# Patient Record
Sex: Female | Born: 1970
Health system: Southern US, Community
[De-identification: ages and names within clinical notes are randomized; demographics above are authoritative.]

## PROBLEM LIST (undated history)

## (undated) DIAGNOSIS — B019 Varicella without complication: Secondary | ICD-10-CM

## (undated) DIAGNOSIS — T7840XA Allergy, unspecified, initial encounter: Secondary | ICD-10-CM

## (undated) HISTORY — PX: WISDOM TOOTH EXTRACTION: SHX21

## (undated) HISTORY — DX: Allergy, unspecified, initial encounter: T78.40XA

## (undated) HISTORY — DX: Varicella without complication: B01.9

---

## 1990-09-12 HISTORY — PX: DILATION AND CURETTAGE, DIAGNOSTIC / THERAPEUTIC: SUR384

## 2009-05-02 ENCOUNTER — Ambulatory Visit: Payer: Self-pay | Admitting: Diagnostic Radiology

## 2009-05-02 ENCOUNTER — Emergency Department (HOSPITAL_BASED_OUTPATIENT_CLINIC_OR_DEPARTMENT_OTHER): Admission: EM | Admit: 2009-05-02 | Discharge: 2009-05-03 | Payer: Self-pay | Admitting: Emergency Medicine

## 2009-05-02 IMAGING — CT CT ABDOMEN W/O CM
2 of 4 series · 16 of 46 positions shown, 18 images · non-contrast
Comparison: None

CT ABDOMEN

CLINICAL DATA: Right-sided flank pain.

CT OF THE ABDOMEN AND PELVIS WITHOUT CONTRAST (CT UROGRAM)
TECHNIQUE: Multidetector CT imaging was performed through the
abdomen and pelvis to include the urinary tract.

[Series 2: renal stone < 200 lbs 5.0 b31f · axial · 0.84mm/px · z∈[-451,-6]mm · 13 of 99 slices shown, 15 images]
[im 5/99  soft-tissue]
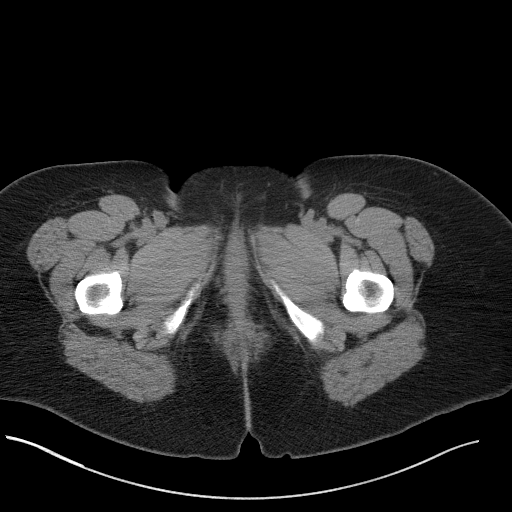
[im 5/99  bone]
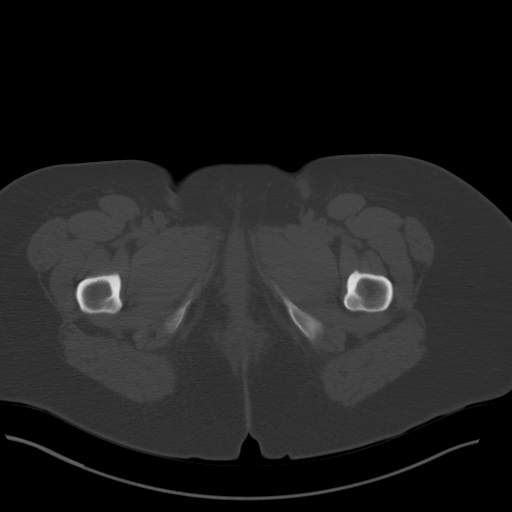
[im 13/99  soft-tissue]
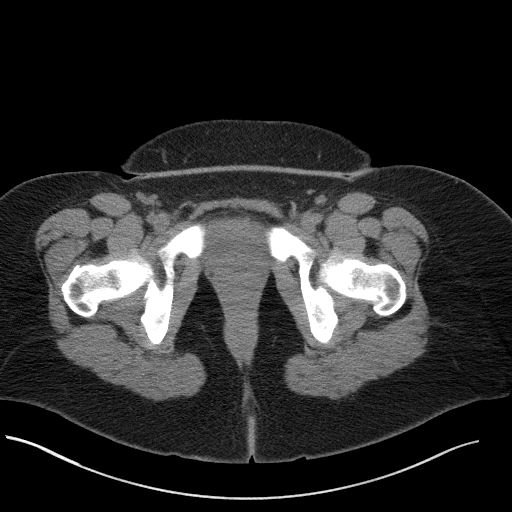
[im 21/99  soft-tissue]
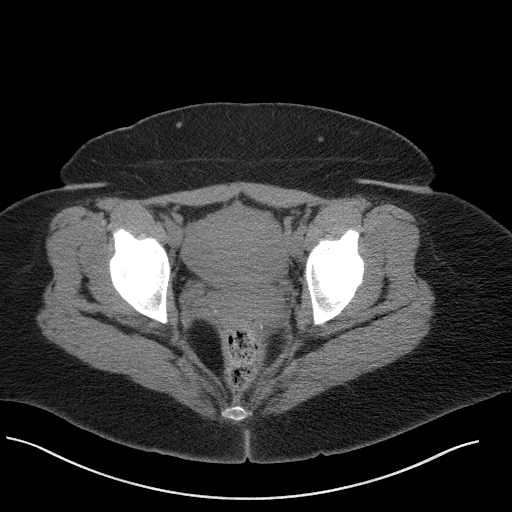
[im 29/99  soft-tissue]
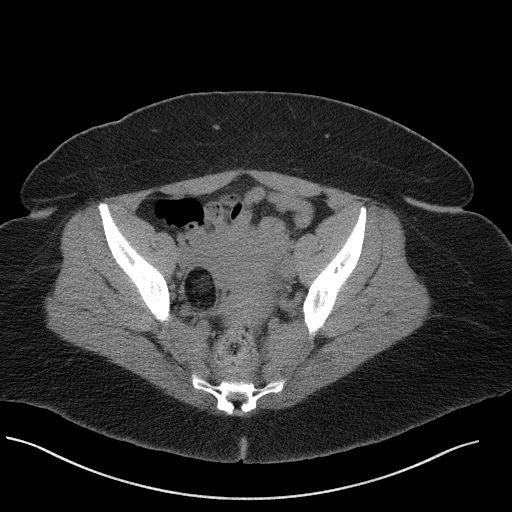
[im 33/99  soft-tissue]
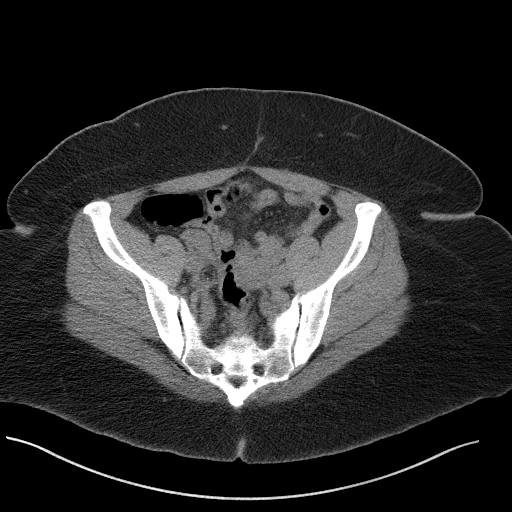
[im 41/99  soft-tissue]
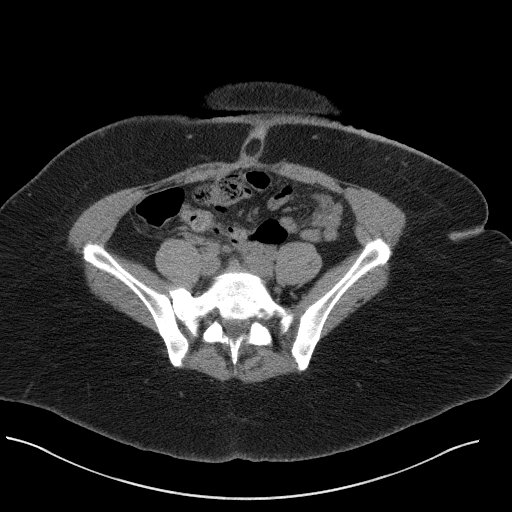
[im 50/99  soft-tissue]
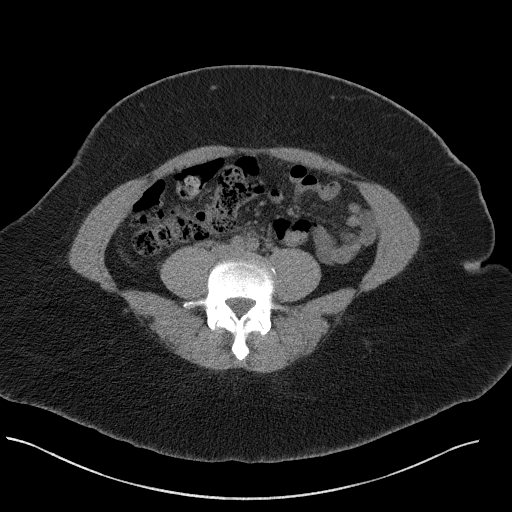
[im 58/99  soft-tissue]
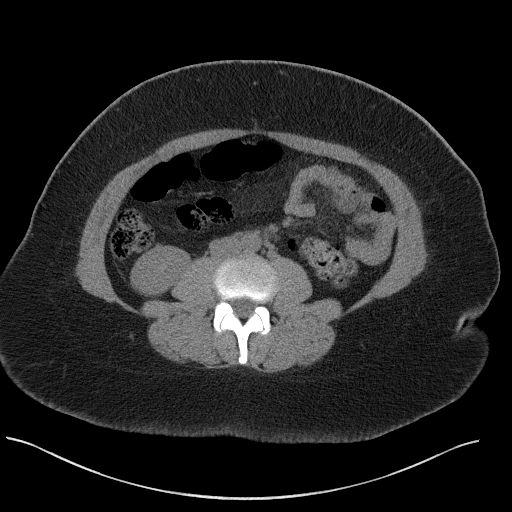
[im 66/99  soft-tissue]
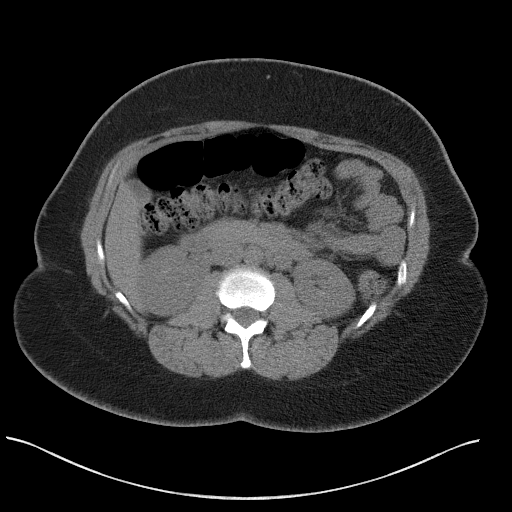
[im 66/99  bone]
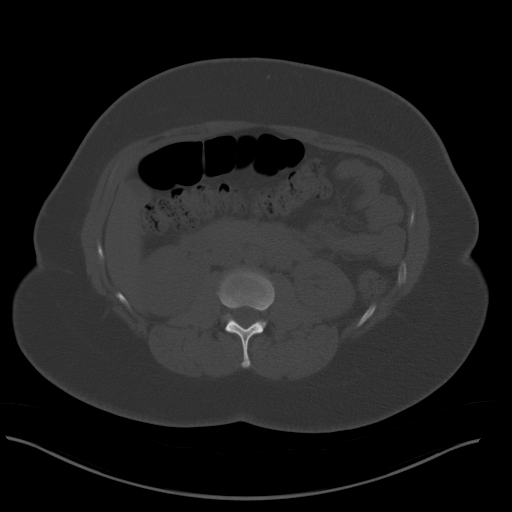
[im 70/99  soft-tissue]
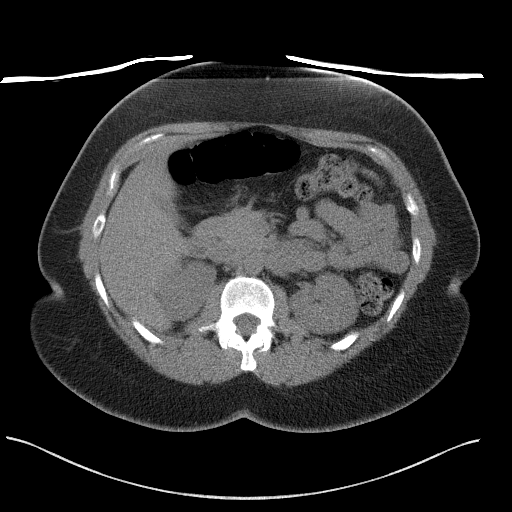
[im 78/99  soft-tissue]
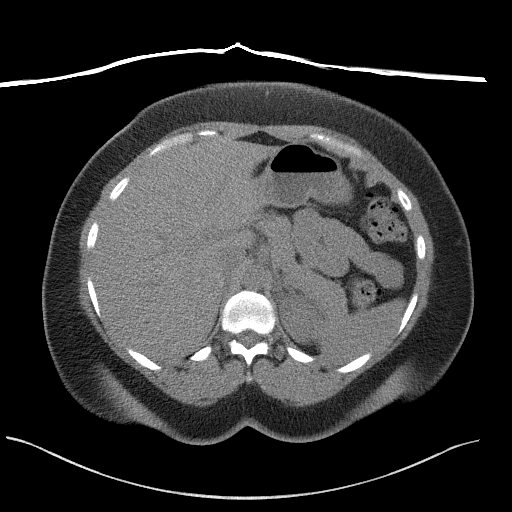
[im 86/99  soft-tissue]
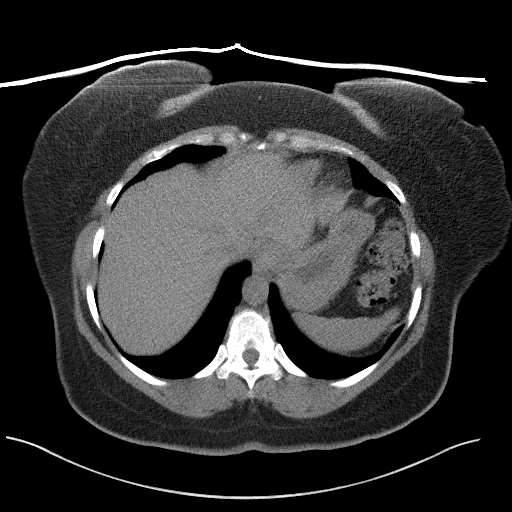
[im 94/99  soft-tissue]
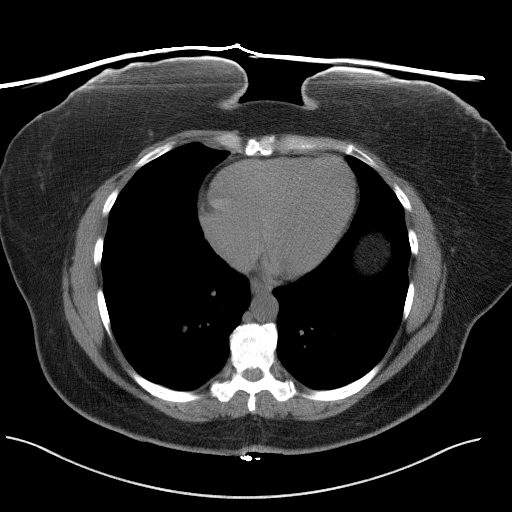

[Series 5: renal stone 3.0 coronal · coronal · 0.89mm/px · 3 of 99 slices shown]
[im 33/99  soft-tissue]
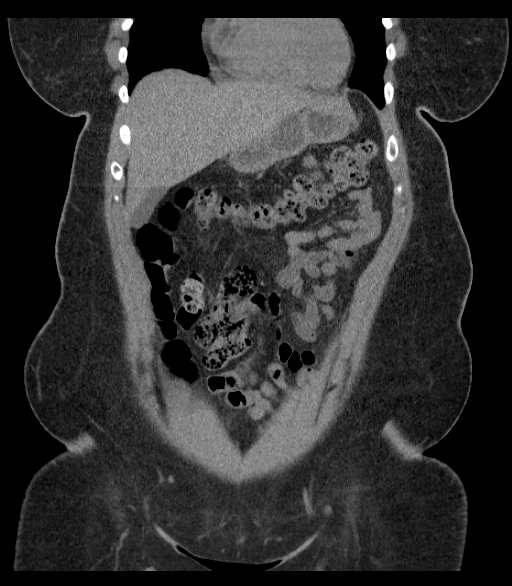
[im 44/99  soft-tissue]
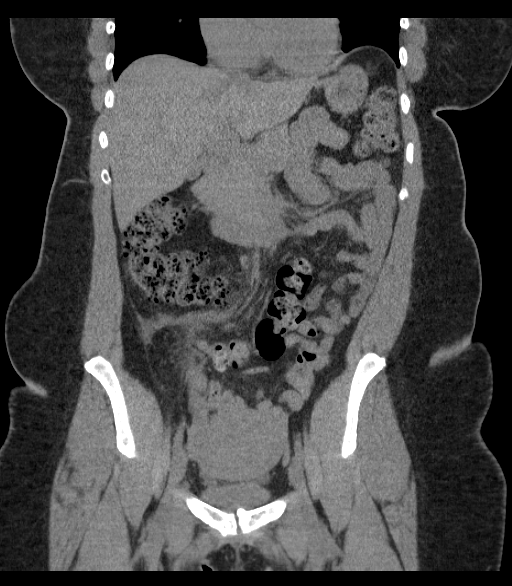
[im 55/99  soft-tissue]
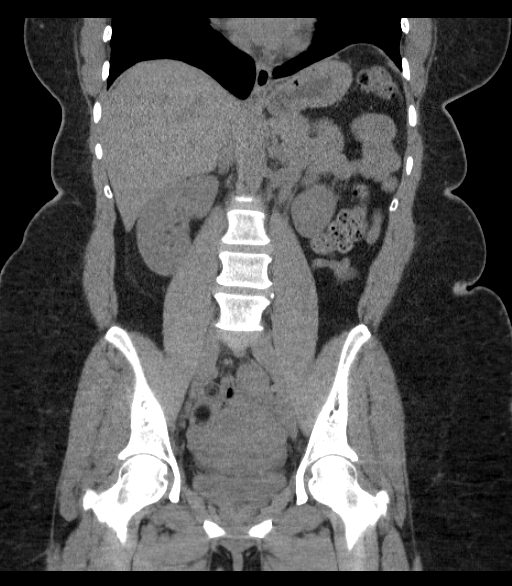

[16 of 46 positions shown; findings below may reference images not displayed]

FINDINGS: Lung bases are clear.  No effusions.  Heart is normal
size.

No renal or proximal ureteral stones.  No hydronephrosis.  Solid
organs have an unremarkable unenhanced appearance.  Gallbladder is
grossly unremarkable. Bowel grossly unremarkable.  No free fluid,
free air, or adenopathy. Aorta is normal caliber.

No acute bony abnormality.
IMPRESSION: No acute findings in the abdomen.

CT PELVIS
FINDINGS: Within the right lower quadrant, adjacent to the
ascending colon, there is inflammatory stranding.  This is separate
from the appendix.  The appendix is visualized in the midline and
is normal.  No definite diverticula are seen although
diverticulitis could give this appearance.  The stranding also is
just anterior to the ovarian vein.  The ovarian vein appears mildly
prominent in its mid section.  This conceivably could also may be
due to ovarian vein thrombosis.

There is a small umbilical hernia containing fat.  Within the right
ovary, there is a 3.8 cm fatty mass compatible with ovarian
dermoid.  Uterus and left ovary unremarkable.  No free fluid or
free air.
IMPRESSION: Inflammatory process in the right lower quadrant separate from the
appendix.  This is best seen posterior to the ascending colon,
anterior to the right psoas muscle and ovarian vein.  This could
reflect an inflammatory process within the colon such as
diverticulitis although no well-defined diverticula are seen.
Alternatively, this could reflect changes of ovarian vein
thrombosis.

Right ovarian dermoid.

## 2010-12-18 LAB — URINALYSIS, ROUTINE W REFLEX MICROSCOPIC
Glucose, UA: NEGATIVE mg/dL
Hgb urine dipstick: NEGATIVE
Ketones, ur: 40 mg/dL — AB
Protein, ur: NEGATIVE mg/dL
Urobilinogen, UA: 1 mg/dL (ref 0.0–1.0)

## 2010-12-18 LAB — COMPREHENSIVE METABOLIC PANEL
ALT: 3 U/L (ref 0–35)
Alkaline Phosphatase: 89 U/L (ref 39–117)
BUN: 10 mg/dL (ref 6–23)
CO2: 24 mEq/L (ref 19–32)
Chloride: 106 mEq/L (ref 96–112)
Glucose, Bld: 84 mg/dL (ref 70–99)
Potassium: 3.7 mEq/L (ref 3.5–5.1)
Sodium: 142 mEq/L (ref 135–145)
Total Bilirubin: 0.6 mg/dL (ref 0.3–1.2)
Total Protein: 8.1 g/dL (ref 6.0–8.3)

## 2010-12-18 LAB — DIFFERENTIAL
Basophils Absolute: 0 10*3/uL (ref 0.0–0.1)
Basophils Relative: 1 % (ref 0–1)
Eosinophils Absolute: 0.1 10*3/uL (ref 0.0–0.7)
Monocytes Relative: 7 % (ref 3–12)
Neutrophils Relative %: 56 % (ref 43–77)

## 2010-12-18 LAB — PREGNANCY, URINE: Preg Test, Ur: NEGATIVE

## 2010-12-18 LAB — URINE MICROSCOPIC-ADD ON

## 2010-12-18 LAB — CBC
HCT: 35.4 % — ABNORMAL LOW (ref 36.0–46.0)
Hemoglobin: 11.4 g/dL — ABNORMAL LOW (ref 12.0–15.0)
RBC: 4.36 MIL/uL (ref 3.87–5.11)
RDW: 15 % (ref 11.5–15.5)
WBC: 5.9 10*3/uL (ref 4.0–10.5)

## 2011-07-11 ENCOUNTER — Inpatient Hospital Stay (INDEPENDENT_AMBULATORY_CARE_PROVIDER_SITE_OTHER)
Admission: RE | Admit: 2011-07-11 | Discharge: 2011-07-11 | Disposition: A | Discharge status: Home / Self Care | Payer: PRIVATE HEALTH INSURANCE | Source: Ambulatory Visit | Attending: Family Medicine | Admitting: Family Medicine

## 2011-07-11 ENCOUNTER — Emergency Department (HOSPITAL_COMMUNITY): Payer: PRIVATE HEALTH INSURANCE

## 2011-07-11 ENCOUNTER — Emergency Department (HOSPITAL_COMMUNITY)
Admission: EM | Admit: 2011-07-11 | Discharge: 2011-07-11 | Disposition: A | Discharge status: Home / Self Care | Payer: PRIVATE HEALTH INSURANCE | Attending: Emergency Medicine | Admitting: Emergency Medicine

## 2011-07-11 DIAGNOSIS — D279 Benign neoplasm of unspecified ovary: Secondary | ICD-10-CM | POA: Insufficient documentation

## 2011-07-11 DIAGNOSIS — R10813 Right lower quadrant abdominal tenderness: Secondary | ICD-10-CM | POA: Insufficient documentation

## 2011-07-11 DIAGNOSIS — D259 Leiomyoma of uterus, unspecified: Secondary | ICD-10-CM | POA: Insufficient documentation

## 2011-07-11 DIAGNOSIS — R109 Unspecified abdominal pain: Secondary | ICD-10-CM | POA: Insufficient documentation

## 2011-07-11 DIAGNOSIS — A499 Bacterial infection, unspecified: Secondary | ICD-10-CM | POA: Insufficient documentation

## 2011-07-11 DIAGNOSIS — R110 Nausea: Secondary | ICD-10-CM | POA: Insufficient documentation

## 2011-07-11 DIAGNOSIS — B9689 Other specified bacterial agents as the cause of diseases classified elsewhere: Secondary | ICD-10-CM | POA: Insufficient documentation

## 2011-07-11 DIAGNOSIS — N760 Acute vaginitis: Secondary | ICD-10-CM | POA: Insufficient documentation

## 2011-07-11 LAB — WET PREP, GENITAL: Yeast Wet Prep HPF POC: NONE SEEN

## 2011-07-11 LAB — URINALYSIS, ROUTINE W REFLEX MICROSCOPIC
Bilirubin Urine: NEGATIVE
Glucose, UA: NEGATIVE mg/dL
Ketones, ur: 15 mg/dL — AB
pH: 6 (ref 5.0–8.0)

## 2011-07-11 LAB — POCT URINALYSIS DIP (DEVICE)
Glucose, UA: NEGATIVE mg/dL
Hgb urine dipstick: NEGATIVE
Leukocytes, UA: NEGATIVE
Nitrite: NEGATIVE
Urobilinogen, UA: 1 mg/dL (ref 0.0–1.0)
pH: 6.5 (ref 5.0–8.0)

## 2011-07-11 LAB — COMPREHENSIVE METABOLIC PANEL
Albumin: 3.5 g/dL (ref 3.5–5.2)
BUN: 10 mg/dL (ref 6–23)
Chloride: 105 mEq/L (ref 96–112)
Creatinine, Ser: 0.8 mg/dL (ref 0.50–1.10)
Total Bilirubin: 0.7 mg/dL (ref 0.3–1.2)
Total Protein: 7.8 g/dL (ref 6.0–8.3)

## 2011-07-11 LAB — CBC
HCT: 38.4 % (ref 36.0–46.0)
MCH: 27.7 pg (ref 26.0–34.0)
MCHC: 32.6 g/dL (ref 30.0–36.0)
MCV: 85.1 fL (ref 78.0–100.0)
RDW: 14 % (ref 11.5–15.5)

## 2011-07-11 LAB — LIPASE, BLOOD: Lipase: 27 U/L (ref 11–59)

## 2011-07-11 LAB — DIFFERENTIAL
Eosinophils Relative: 2 % (ref 0–5)
Lymphocytes Relative: 46 % (ref 12–46)
Lymphs Abs: 1.7 10*3/uL (ref 0.7–4.0)
Monocytes Absolute: 0.3 10*3/uL (ref 0.1–1.0)
Monocytes Relative: 8 % (ref 3–12)

## 2011-07-11 LAB — POCT PREGNANCY, URINE: Preg Test, Ur: NEGATIVE

## 2011-07-11 IMAGING — US US TRANSVAGINAL NON-OB
1 series · 14 of 25 positions shown · non-contrast
Comparison: CT [DATE]

CLINICAL DATA: Right lower quadrant abdominal pain for 3-4 days.
Gravida 4, para 3.  LMP [DATE].

TRANSABDOMINAL AND TRANSVAGINAL ULTRASOUND OF PELVIS
TECHNIQUE: Both transabdominal and transvaginal ultrasound
examinations of the pelvis were performed. Transabdominal technique
was performed for global imaging of the pelvis including uterus,
ovaries, adnexal regions, and pelvic cul-de-sac.

[Series 1: us transvaginal non-ob · 0.28mm/px · 41 acquisitions, 14 frames shown]
[im 1/41]
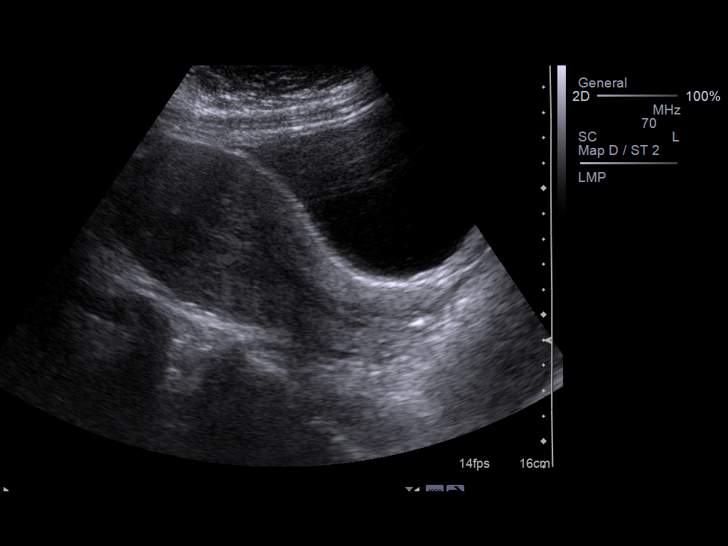
[im 4/41]
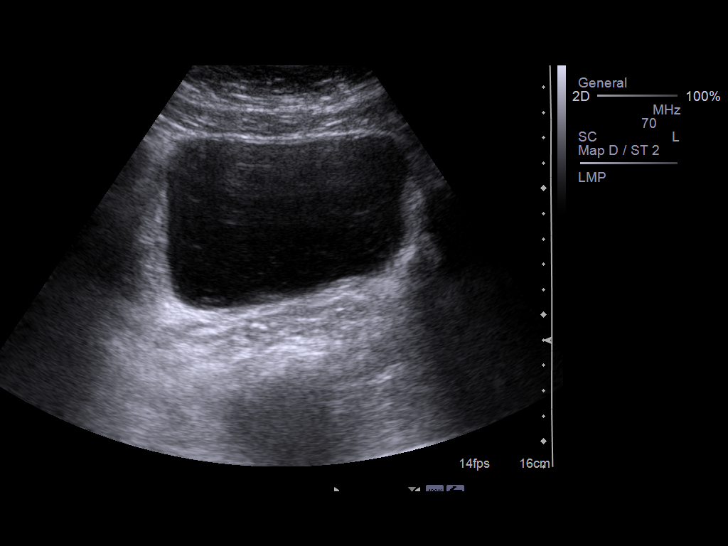
[im 7/41]
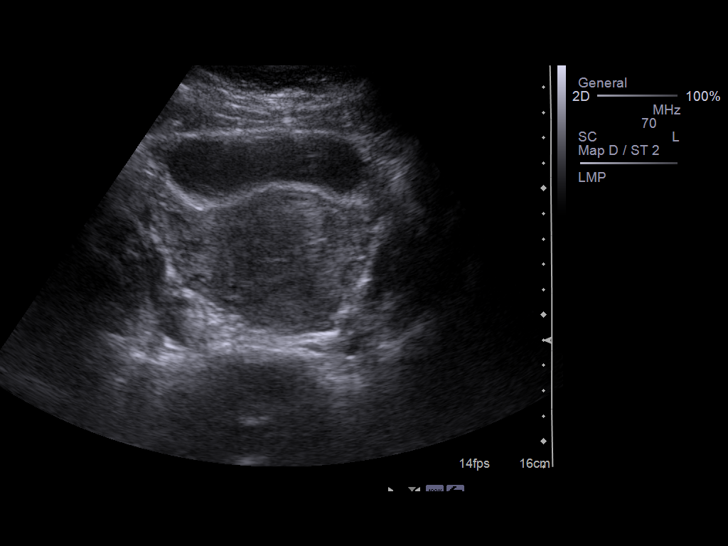
[im 11/41]
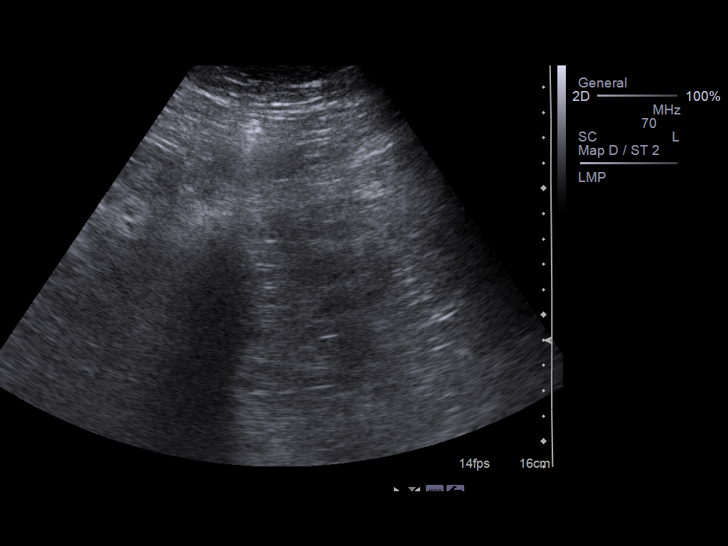
[im 14/41]
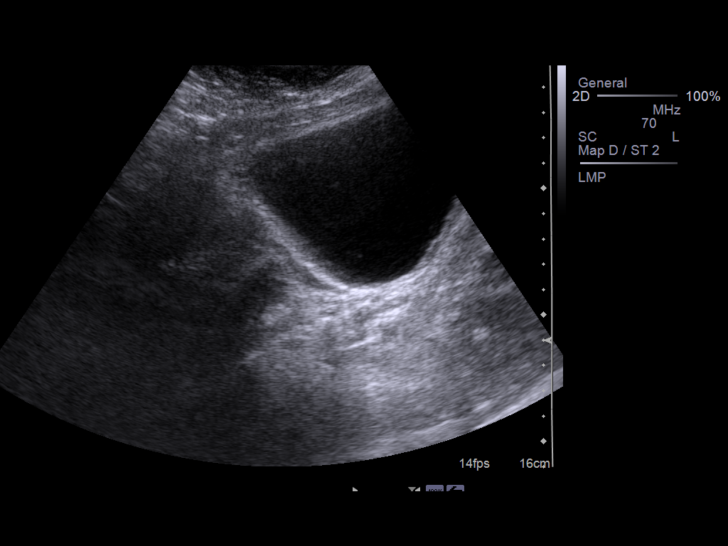
[im 16/41]
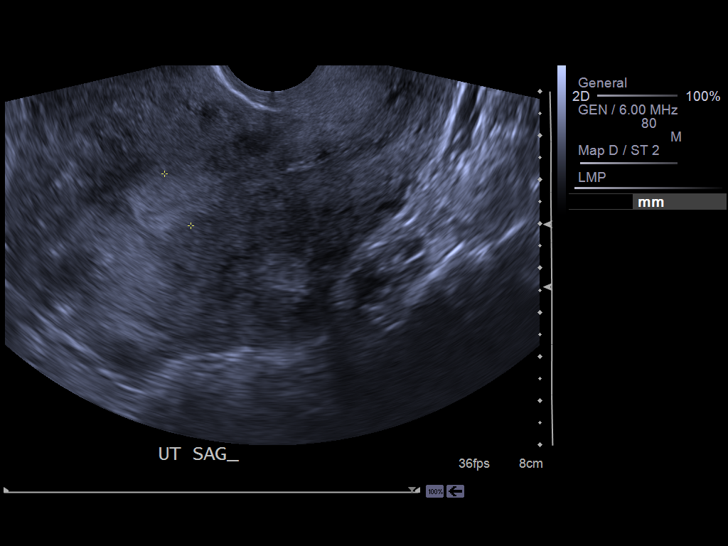
[im 19/41]
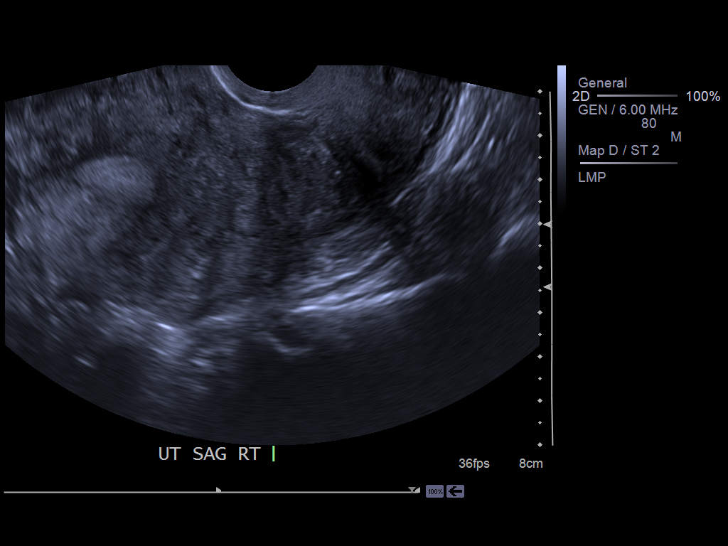
[im 22/41]
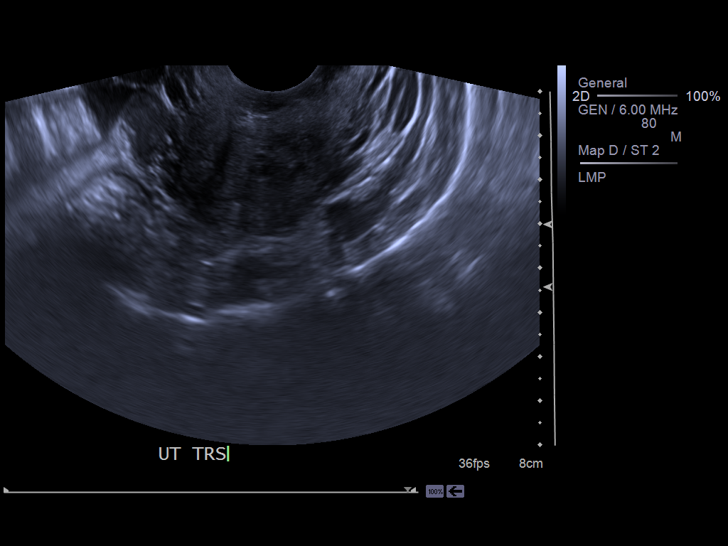
[im 26/41]
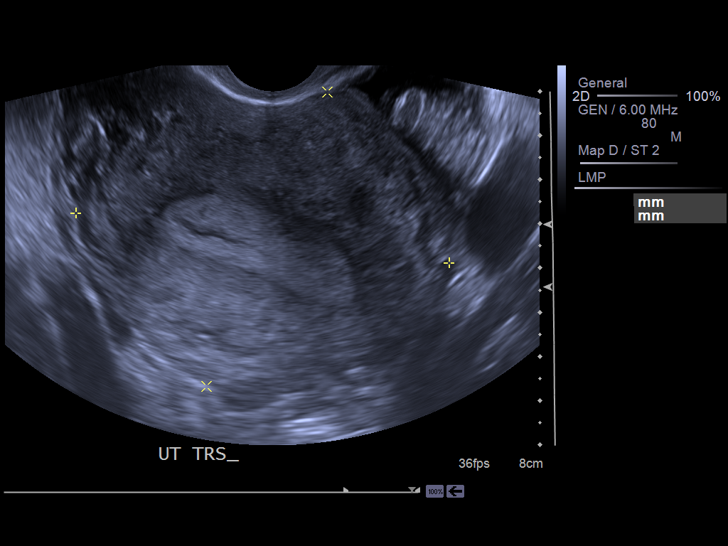
[im 27/41]
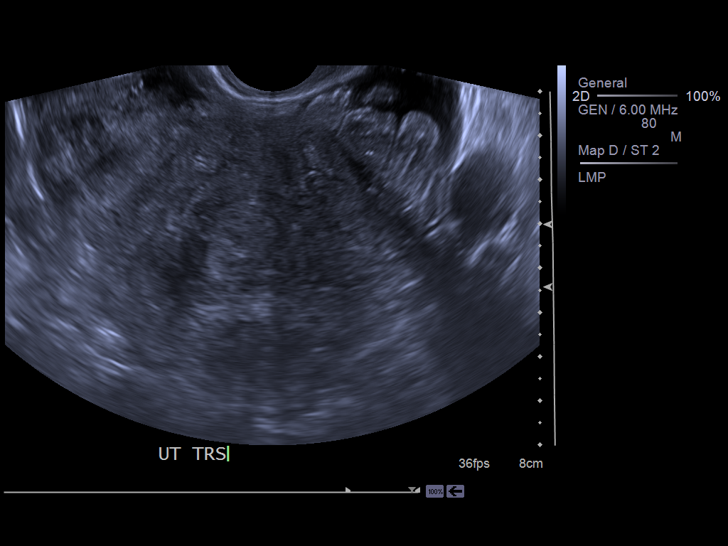
[im 31/41]
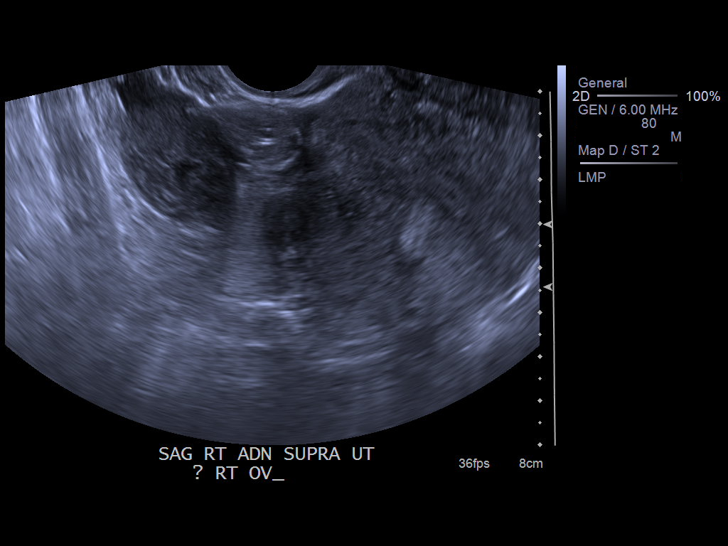
[im 34/41]
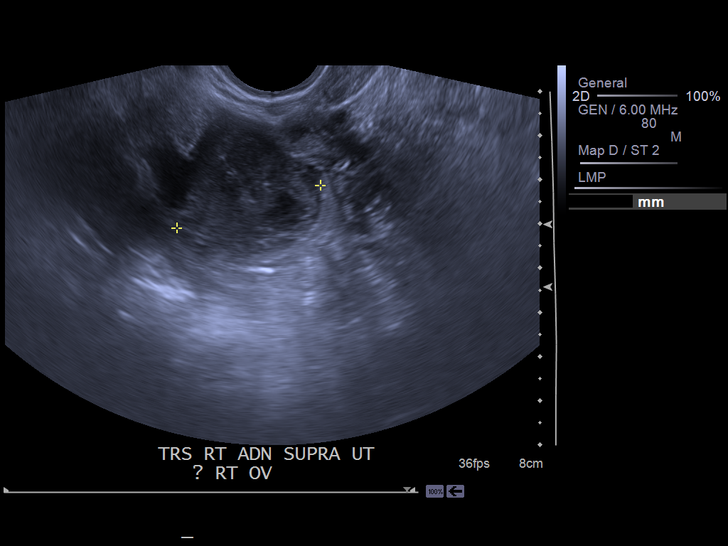
[im 37/41]
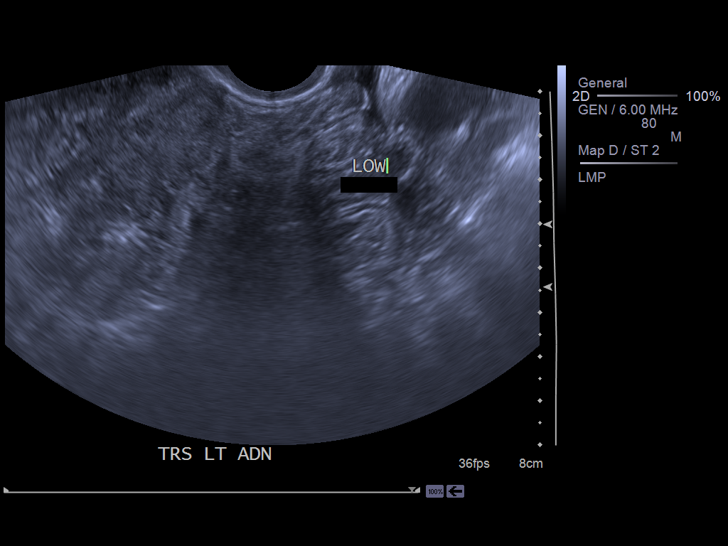
[im 41/41]
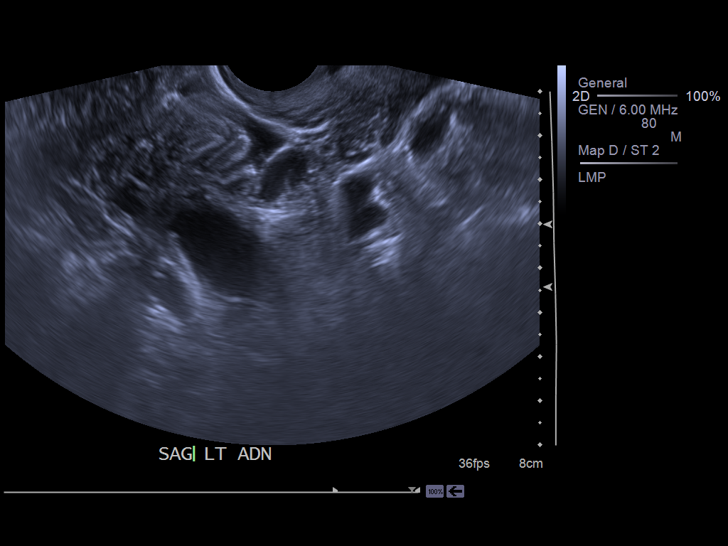

[14 of 25 positions shown; findings below may reference images not displayed]

It was necessary to proceed with endovaginal exam following the
transabdominal exam to visualize the uterus and ovaries.
FINDINGS: Uterus: The uterus is 13.8 x 7.1 x 8.4 cm.  Multiple hypoechoic
nodules are identified, the largest of which measures 2.2 x 1.7 x
2.0 cm.  The findings are consistent with fibroids.

Endometrium: The endometrium is 1.3 cm in thickness and is
homogeneous.

Right ovary:  The right ovary is 3.6 x 2.4 x 3.4 cm.  The
appearance is normal.

Left ovary: The left ovary is not well seen.  Ovary is likely
obscured by bowel gas.

Other findings: There is a small amount free pelvic fluid.
IMPRESSION: 1.  Enlarged uterus, containing small fibroids.
2.  Normal-appearing right ovary.
3.  Non-visualized left ovary.

## 2011-07-11 IMAGING — CT CT ABD-PELV W/ CM
2 of 5 series · 17 of 46 positions shown, 19 images · IV contrast (omnipaque)
Comparison: [DATE]

CLINICAL DATA: Abdominal and right flank pain radiating to right
lower quadrant

CT ABDOMEN AND PELVIS WITH CONTRAST
TECHNIQUE: Multidetector CT imaging of the abdomen and pelvis was
performed following the standard protocol during bolus
administration of intravenous contrast. Sagittal and coronal MPR
images reconstructed from axial data set.
Contrast: 100mL OMNIPAQUE IOHEXOL 300 MG/ML IV SOLN; Dilute oral
contrast.

[Series 2: routine · axial · 0.72mm/px · z∈[+141,+596]mm · 14 of 107 slices shown, 16 images]
[im 8/107  soft-tissue]
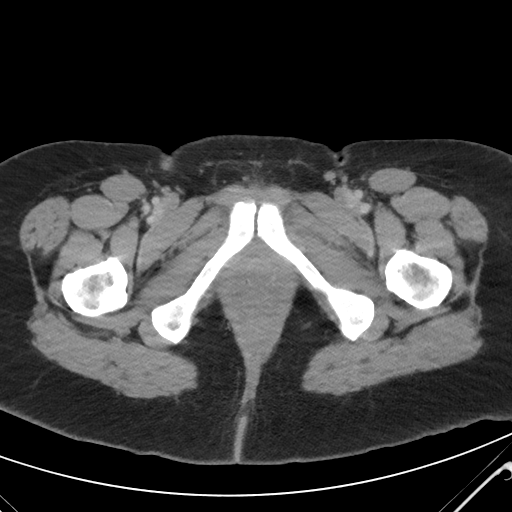
[im 8/107  bone]
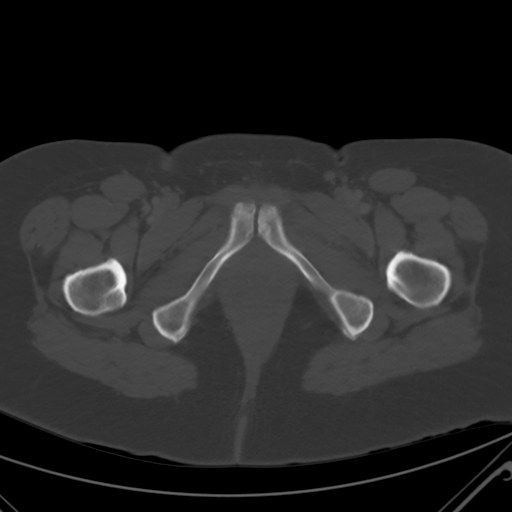
[im 15/107  soft-tissue]
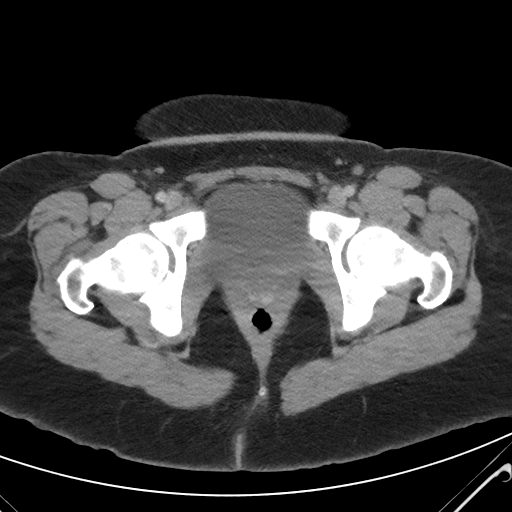
[im 22/107  soft-tissue]
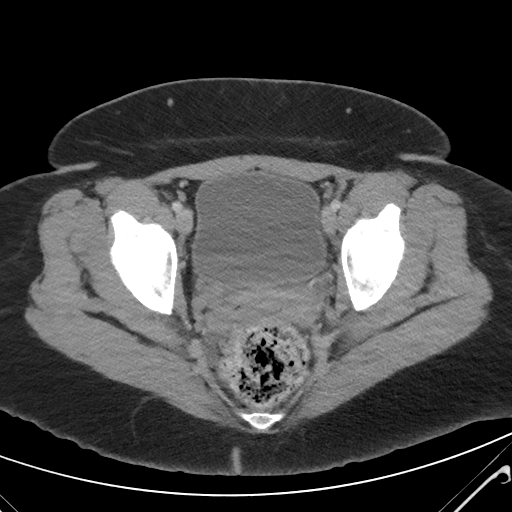
[im 29/107  soft-tissue]
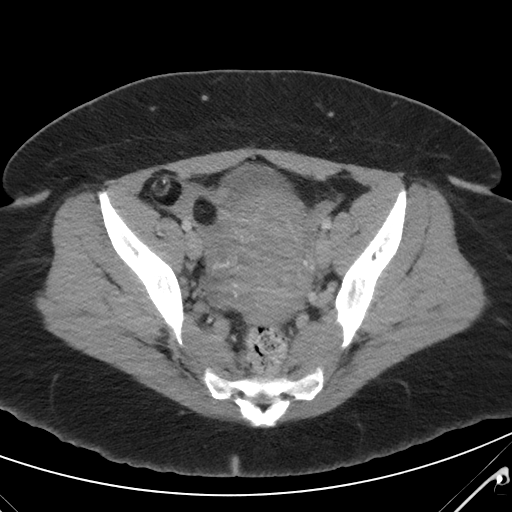
[im 36/107  soft-tissue]
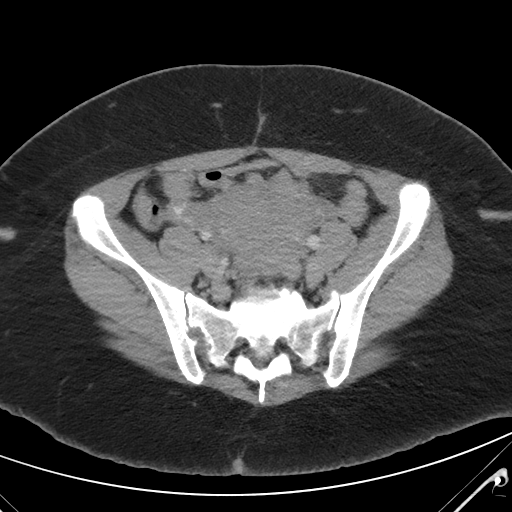
[im 43/107  soft-tissue]
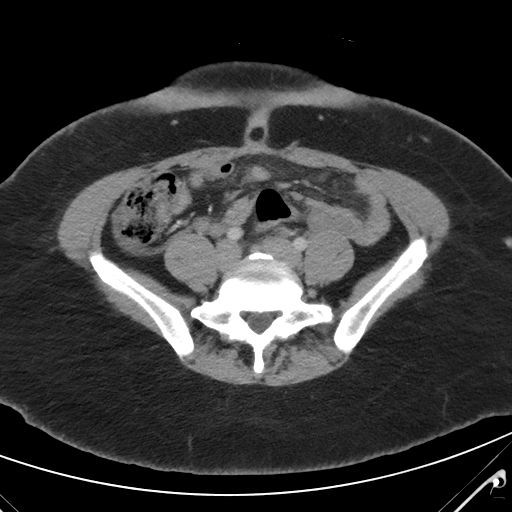
[im 50/107  soft-tissue]
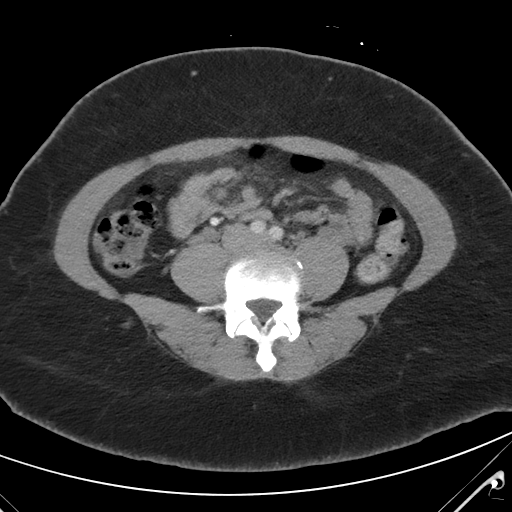
[im 57/107  soft-tissue]
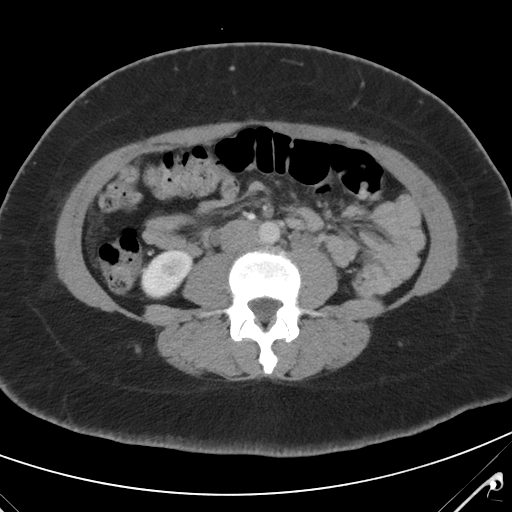
[im 64/107  soft-tissue]
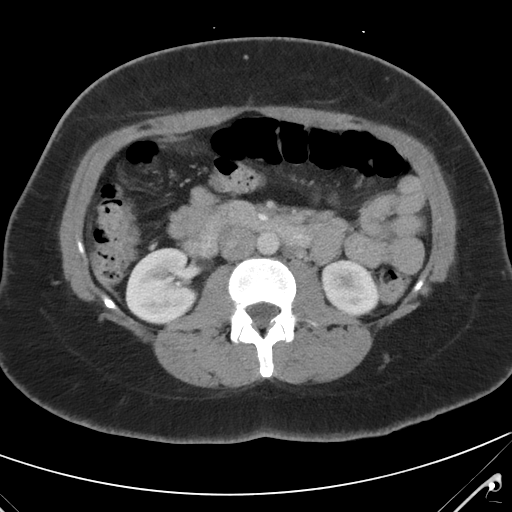
[im 64/107  bone]
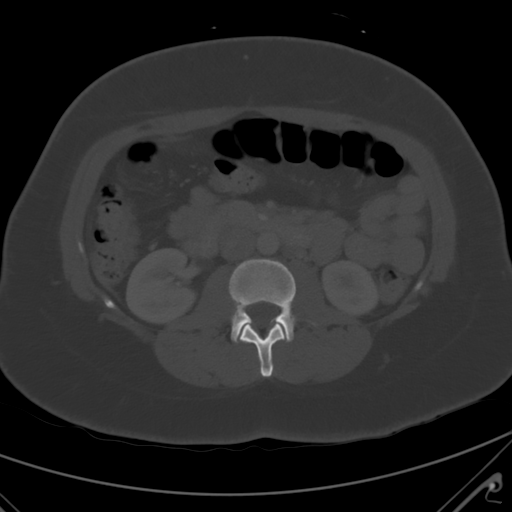
[im 71/107  soft-tissue]
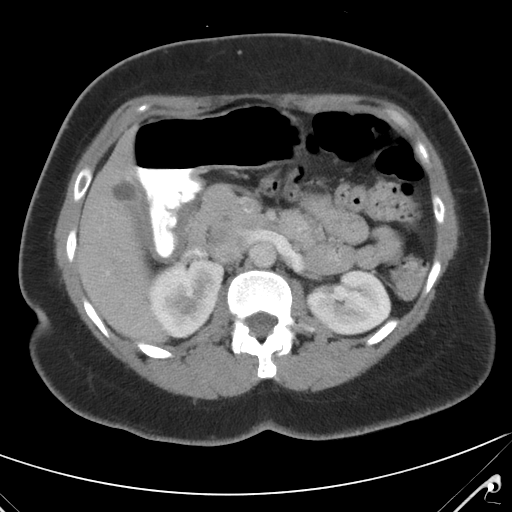
[im 78/107  soft-tissue]
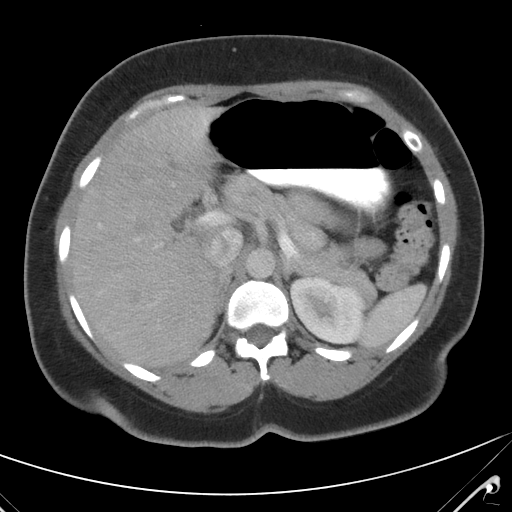
[im 85/107  soft-tissue]
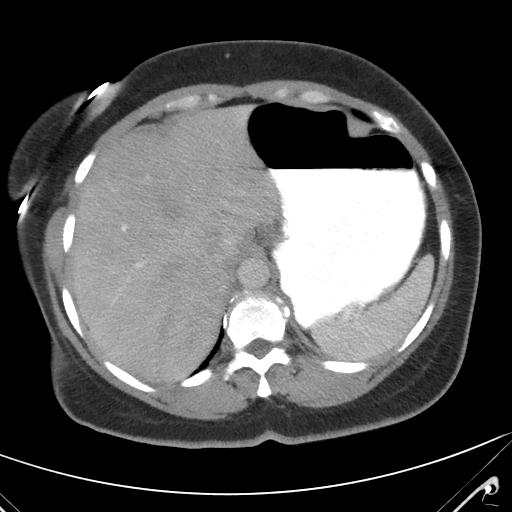
[im 92/107  soft-tissue]
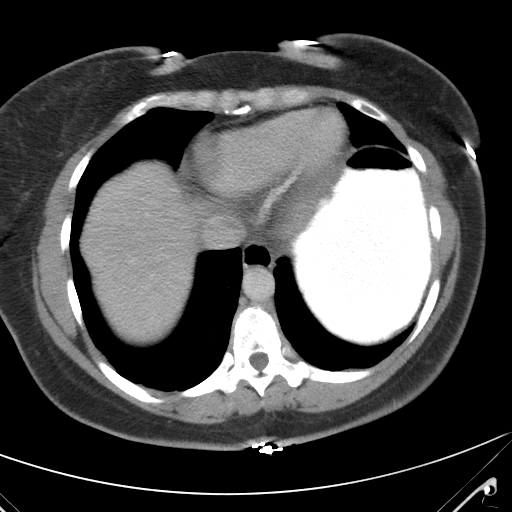
[im 99/107  soft-tissue]
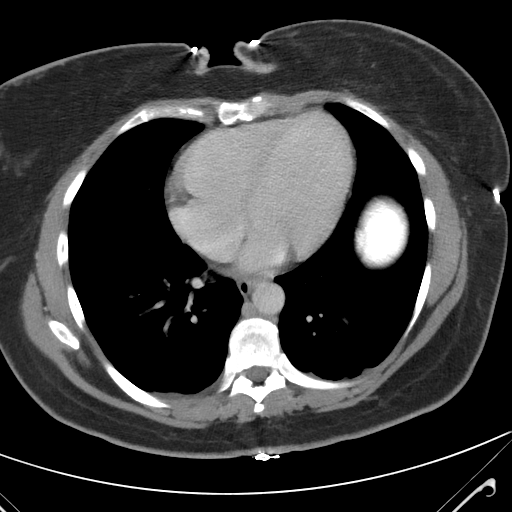

[mpr, coronals, coronal · coronal · 1.04mm/px · 3 of 119 slices shown]
[im 40/119  soft-tissue]
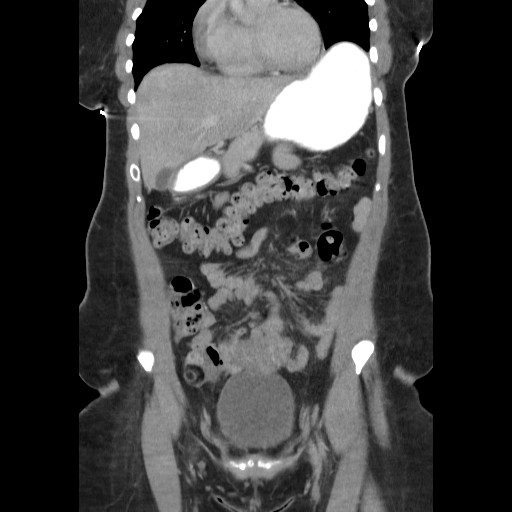
[im 53/119  soft-tissue]
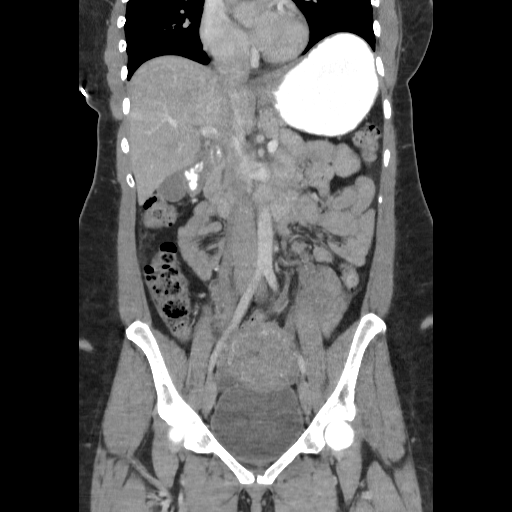
[im 66/119  soft-tissue]
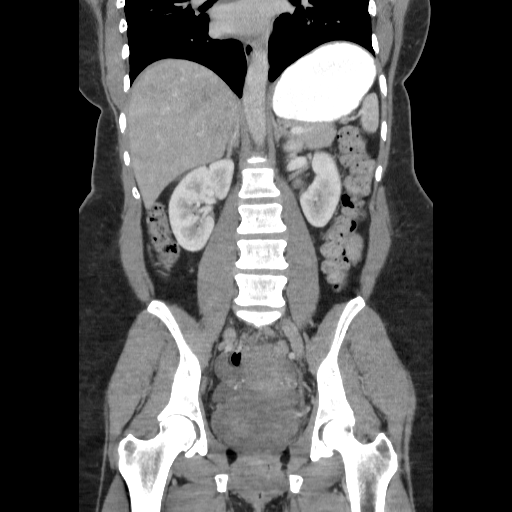

[17 of 46 positions shown; findings below may reference images not displayed]

FINDINGS: Lung bases clear.
Liver, spleen, pancreas, kidneys, and adrenal glands normal
appearance.
Stomach and small bowel loops normal appearance.
Normal appendix.
Enlarged uterus with slightly nodular contours suspect containing
small leiomyomata.
Mass identified in right adnexa, 3.0 x 4.3 x 4.8 cm in size,
containing soft tissue and fat components, compatible with a right
ovarian dermoid tumor.
No definite left ovarian mass identified, though left ovary is
poorly defined due to the adjacent small bowel loops.
Unremarkable urinary bladder.
No additional mass, adenopathy, or hernia.
Stomach and bowel loops grossly normal appearance.
Question small amount free fluid in right pelvis, nonspecific.
No acute osseous findings.
IMPRESSION: Right adnexal mass 3.0 x 4.3 x 4.8 cm in size containing soft
tissue and fat components compatible with right ovarian dermoid
tumor.
Enlarged uterus likely containing small leiomyomata.
Normal appendix.
Small umbilical hernia containing fat.
Small amount nonspecific free pelvic fluid.

## 2011-07-11 MED ORDER — IOHEXOL 300 MG/ML  SOLN
100.0000 mL | Freq: Once | INTRAMUSCULAR | Status: AC | PRN
  Administered 2011-07-11: 100 mL via INTRAVENOUS

## 2011-08-31 ENCOUNTER — Encounter: Payer: PRIVATE HEALTH INSURANCE | Admitting: Obstetrics and Gynecology

## 2014-08-11 ENCOUNTER — Emergency Department (HOSPITAL_BASED_OUTPATIENT_CLINIC_OR_DEPARTMENT_OTHER)
Admission: EM | Admit: 2014-08-11 | Discharge: 2014-08-11 | Disposition: A | Discharge status: Home / Self Care | Payer: PRIVATE HEALTH INSURANCE | Attending: Emergency Medicine | Admitting: Emergency Medicine

## 2014-08-11 ENCOUNTER — Encounter (HOSPITAL_BASED_OUTPATIENT_CLINIC_OR_DEPARTMENT_OTHER): Payer: Self-pay | Admitting: *Deleted

## 2014-08-11 DIAGNOSIS — K088 Other specified disorders of teeth and supporting structures: Secondary | ICD-10-CM | POA: Insufficient documentation

## 2014-08-11 DIAGNOSIS — K0889 Other specified disorders of teeth and supporting structures: Secondary | ICD-10-CM

## 2014-08-11 DIAGNOSIS — K029 Dental caries, unspecified: Secondary | ICD-10-CM | POA: Insufficient documentation

## 2014-08-11 MED ORDER — HYDROCODONE-ACETAMINOPHEN 5-325 MG PO TABS: 2.0000 | ORAL_TABLET | ORAL | Status: DC | PRN

## 2014-08-11 MED ORDER — AMOXICILLIN 500 MG PO CAPS: 500.0000 mg | ORAL_CAPSULE | Freq: Three times a day (TID) | ORAL | Status: AC

## 2014-08-11 NOTE — ED Provider Notes (Signed)
CSN: [id number]     Arrival date & time 08/11/14  24 History   First MD Initiated Contact with Patient 08/11/14 1635     Chief Complaint  Patient presents with  . Dental Pain     (Consider location/radiation/quality/duration/timing/severity/associated sxs/prior Treatment) Patient is a 43 y.o. female presenting with tooth pain. The history is provided by the patient. No language interpreter was used.  Dental Pain Location:  Lower Quality:  No pain Severity:  Moderate Onset quality:  Gradual Duration:  2 days Timing:  Constant Progression:  Worsening Chronicity:  New Context: poor dentition   Relieved by:  Nothing Worsened by:  Nothing tried Ineffective treatments:  None tried Risk factors: smoking     History reviewed. No pertinent past medical history. No past surgical history on file. History reviewed. No pertinent family history. History  Substance Use Topics  . Smoking status: Not on file  . Smokeless tobacco: Not on file  . Alcohol Use: Not on file   OB History    No data available     Review of Systems  HENT: Positive for dental problem.   All other systems reviewed and are negative.     Allergies  Review of patient's allergies indicates no known allergies.  Home Medications   Prior to Admission medications   Not on File   BP 121/76 mmHg  Pulse 79  Temp(Src) 97.8 F (36.6 C) (Oral)  Resp 16  Ht 5\' 11"  (1.803 m)  Wt 250 lb (113.399 kg)  BMI 34.88 kg/m2  SpO2 100%  LMP 08/04/2014 Physical Exam  Constitutional: She is oriented to person, place, and time. She appears well-developed and well-nourished.  HENT:  Head: Normocephalic.  Broken decayed right lower 1st molar  Eyes: EOM are normal.  Neck: Normal range of motion.  Pulmonary/Chest: Effort normal.  Abdominal: She exhibits no distension.  Musculoskeletal: Normal range of motion.  Neurological: She is alert and oriented to person, place, and time.  Psychiatric: She has a normal mood  and affect.  Nursing note and vitals reviewed.   ED Course  Procedures (including critical care time) Labs Review Labs Reviewed - No data to display  Imaging Review No results found.   EKG Interpretation None      MDM   Final diagnoses:  Toothache    Amoxicillian Hydrocodone See your Dentist as soon as possible    Fransico Meadow, PA-C 08/11/14 1715  Debby Freiberg, MD 08/17/14 [phone]

## 2014-08-11 NOTE — ED Notes (Signed)
PA at bedside.

## 2014-08-11 NOTE — ED Notes (Signed)
Pt reports (R) lower dental pain since yesterday.

## 2014-08-11 NOTE — Discharge Instructions (Signed)

## 2015-11-19 ENCOUNTER — Encounter: Payer: Self-pay | Admitting: Obstetrics & Gynecology

## 2016-11-07 ENCOUNTER — Encounter (HOSPITAL_BASED_OUTPATIENT_CLINIC_OR_DEPARTMENT_OTHER): Payer: Self-pay | Admitting: *Deleted

## 2016-11-07 ENCOUNTER — Emergency Department (HOSPITAL_BASED_OUTPATIENT_CLINIC_OR_DEPARTMENT_OTHER)
Admission: EM | Admit: 2016-11-07 | Discharge: 2016-11-07 | Disposition: A | Discharge status: Home / Self Care | Payer: PRIVATE HEALTH INSURANCE | Attending: Emergency Medicine | Admitting: Emergency Medicine

## 2016-11-07 DIAGNOSIS — S39012A Strain of muscle, fascia and tendon of lower back, initial encounter: Secondary | ICD-10-CM | POA: Insufficient documentation

## 2016-11-07 DIAGNOSIS — Y999 Unspecified external cause status: Secondary | ICD-10-CM | POA: Insufficient documentation

## 2016-11-07 DIAGNOSIS — Y929 Unspecified place or not applicable: Secondary | ICD-10-CM | POA: Insufficient documentation

## 2016-11-07 DIAGNOSIS — Y939 Activity, unspecified: Secondary | ICD-10-CM | POA: Insufficient documentation

## 2016-11-07 DIAGNOSIS — X58XXXA Exposure to other specified factors, initial encounter: Secondary | ICD-10-CM | POA: Insufficient documentation

## 2016-11-07 LAB — URINALYSIS, MICROSCOPIC (REFLEX)

## 2016-11-07 LAB — URINALYSIS, ROUTINE W REFLEX MICROSCOPIC
Glucose, UA: NEGATIVE mg/dL
Ketones, ur: NEGATIVE mg/dL
NITRITE: NEGATIVE
PROTEIN: 30 mg/dL — AB
SPECIFIC GRAVITY, URINE: 1.03 (ref 1.005–1.030)
pH: 6.5 (ref 5.0–8.0)

## 2016-11-07 MED ORDER — METHOCARBAMOL 500 MG PO TABS
500.0000 mg | ORAL_TABLET | Freq: Once | ORAL | Status: AC
  Administered 2016-11-07: 500 mg via ORAL
  Filled 2016-11-07: qty 1

## 2016-11-07 MED ORDER — METHOCARBAMOL 500 MG PO TABS: 500.0000 mg | ORAL_TABLET | Freq: Two times a day (BID) | ORAL | 0 refills | Status: DC

## 2016-11-07 MED ORDER — IBUPROFEN 400 MG PO TABS: 400.0000 mg | ORAL_TABLET | Freq: Four times a day (QID) | ORAL | 0 refills | Status: AC

## 2016-11-07 MED ORDER — KETOROLAC TROMETHAMINE 60 MG/2ML IM SOLN
30.0000 mg | Freq: Once | INTRAMUSCULAR | Status: AC
  Administered 2016-11-07: 30 mg via INTRAMUSCULAR
  Filled 2016-11-07: qty 2

## 2016-11-07 NOTE — ED Provider Notes (Signed)
[facility] Provider Note   CSN: [id number] Arrival date & time: 11/07/16  1649  By signing my name below, I, Samantha Holden, attest that this documentation has been prepared under the direction and in the presence of physician practitioner, Merrily Pew, MD. Electronically Signed: [name], Scribe. 11/07/2016. 6:47 PM.  History   Chief Complaint Chief Complaint  Patient presents with  . Back Pain    The history is provided by the patient. No language interpreter was used.     HPI Comments: Samantha Holden is a 46 y.o. female with no chronic medical problems who presents to the Emergency Department complaining of intermittent, aching mid-back pain for a couple of days. No associated symptoms noted. She is unsure what she was doing when her back pain initially presented. No trauma to her back. She has tried ibuprofen and Aleve with no improvement of her back pain. Pt notes a h/o similar back pain which resolved on its own and was not as prolonged as her current onset of back pain. No h/o UTI or kidney stones. She denies rashes, nausea, vomiting, fever, chills, dysuria, dark urine, constipation, or any other associated symptoms.  History reviewed. No pertinent past medical history.  There are no active problems to display for this patient.   History reviewed. No pertinent surgical history.  OB History    No data available       Home Medications    Prior to Admission medications   Medication Sig Start Date End Date Taking? Authorizing Provider  ibuprofen (ADVIL,MOTRIN) 400 MG tablet Take 1 tablet (400 mg total) by mouth 4 (four) times daily. 11/07/16 11/12/16  Merrily Pew, MD  methocarbamol (ROBAXIN) 500 MG tablet Take 1 tablet (500 mg total) by mouth 2 (two) times daily. 11/07/16   Merrily Pew, MD    Family History No family history on file.  Social History Social History  Substance Use Topics  . Smoking status: Never Smoker  . Smokeless tobacco: Never  Used  . Alcohol use No     Allergies   Patient has no known allergies.   Review of Systems Review of Systems  Constitutional: Negative for chills and fever.  Gastrointestinal: Negative for constipation, nausea and vomiting.  Genitourinary: Negative for dysuria.  Musculoskeletal: Positive for back pain.  Skin: Negative for rash.  All other systems reviewed and are negative.    Physical Exam Updated Vital Signs BP 121/87 (BP Location: Right Arm)   Pulse 79   Temp 97.9 F (36.6 C) (Oral)   Resp 20   Ht 5\' 11"  (1.803 m)   Wt (!) 304 lb 4.8 oz (138 kg)   LMP 10/13/2016   SpO2 100%   BMI 42.44 kg/m   Physical Exam  Constitutional: She is oriented to person, place, and time. She appears well-developed and well-nourished. No distress.  HENT:  Head: Normocephalic and atraumatic.  Eyes: Conjunctivae and EOM are normal.  Neck: Neck supple. No tracheal deviation present.  Cardiovascular: Normal rate.   Pulmonary/Chest: Effort normal. No respiratory distress.  Musculoskeletal: Normal range of motion. She exhibits tenderness.  Right paraspinal TTP to the lower thoracic area. No obvious muscle spasms.  Neurological: She is alert and oriented to person, place, and time.  Skin: Skin is warm and dry. No rash noted.  Psychiatric: She has a normal mood and affect. Her behavior is normal.  Nursing note and vitals reviewed.    ED Treatments / Results  Labs (all labs ordered are listed, but  only abnormal results are displayed) Labs Reviewed  URINALYSIS, ROUTINE W REFLEX MICROSCOPIC - Abnormal; Notable for the following:       Result Value   APPearance TURBID (*)    Hgb urine dipstick TRACE (*)    Bilirubin Urine SMALL (*)    Protein, ur 30 (*)    Leukocytes, UA MODERATE (*)    All other components within normal limits  URINALYSIS, MICROSCOPIC (REFLEX) - Abnormal; Notable for the following:    Bacteria, UA MANY (*)    Squamous Epithelial / LPF 6-30 (*)    All other  components within normal limits    EKG  EKG Interpretation None       Radiology No results found.  Procedures Procedures (including critical care time)  DIAGNOSTIC STUDIES: Oxygen Saturation is 100% on RA, normal by my interpretation.    COORDINATION OF CARE: 6:56 PM Discussed treatment plan with pt at bedside and pt agreed to plan.  Medications Ordered in ED Medications  ketorolac (TORADOL) injection 30 mg (30 mg Intramuscular Given 11/07/16 1903)  methocarbamol (ROBAXIN) tablet 500 mg (500 mg Oral Given 11/07/16 1903)     Initial Impression / Assessment and Plan / ED Course  I have reviewed the triage vital signs and the nursing notes.  Pertinent labs & imaging results that were available during my care of the patient were reviewed by me and considered in my medical decision making (see chart for details).     Likely muscular strain. No neurologic discs function to suggest spinal issue. No urinary symptoms suggest UTI. We'll treat supportive care with exercise, heat, massage and NSAIDs at home.  Final Clinical Impressions(s) / ED Diagnoses   Final diagnoses:  Strain of lumbar region, initial encounter    New Prescriptions Discharge Medication List as of 11/07/2016  6:59 PM    START taking these medications   Details  ibuprofen (ADVIL,MOTRIN) 400 MG tablet Take 1 tablet (400 mg total) by mouth 4 (four) times daily., Starting Mon 11/07/2016, Until Sat 11/12/2016, Print    methocarbamol (ROBAXIN) 500 MG tablet Take 1 tablet (500 mg total) by mouth 2 (two) times daily., Starting Mon 11/07/2016, Print       I personally performed the services described in this documentation, which was scribed in my presence. The recorded information has been reviewed and is accurate.    Merrily Pew, MD 11/08/16 [phone]

## 2016-11-07 NOTE — ED Notes (Signed)
Pt has mid back pain that radiates towards right shoulder posterior. Pt states this pain was sudden without known cause. Pt denies any numbness or tingling. Pt states she had one UTI 10 years ago. Pt denies any other problems/symptoms. Pt is A&O x4.

## 2016-11-07 NOTE — ED Triage Notes (Signed)
Mid back pain x 2 days. No known injury. She has been taking Aleve without relief.

## 2016-11-10 IMAGING — US US EXTREM LOW VENOUS*R*
1 series · 13 of 24 positions shown · non-contrast
Comparison: None.

CLINICAL DATA: Right ankle pain and swelling for the past 4 days.
Evaluate for DVT.



[Series 1: us extrem low venous*right* · 0.08mm/px · 13 of 30 slices shown]
[im 1/30]
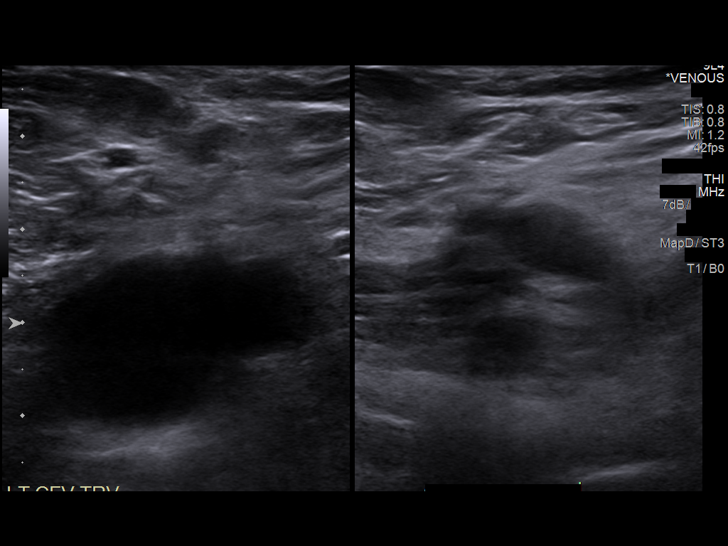
[im 3/30]
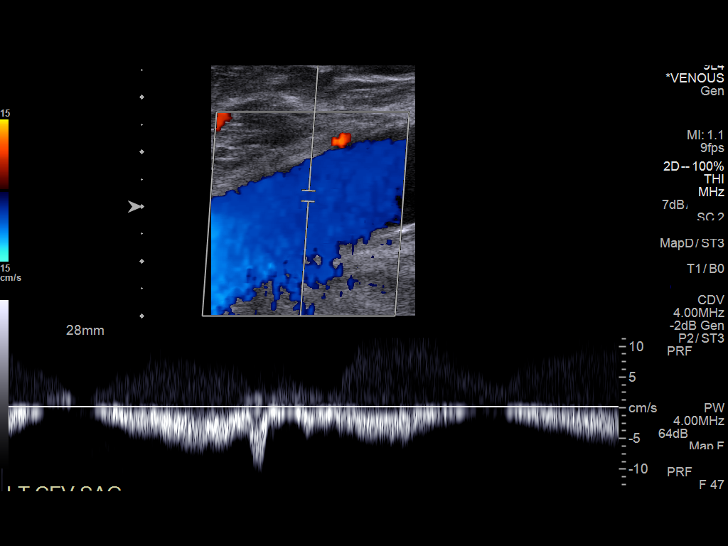
[im 6/30]
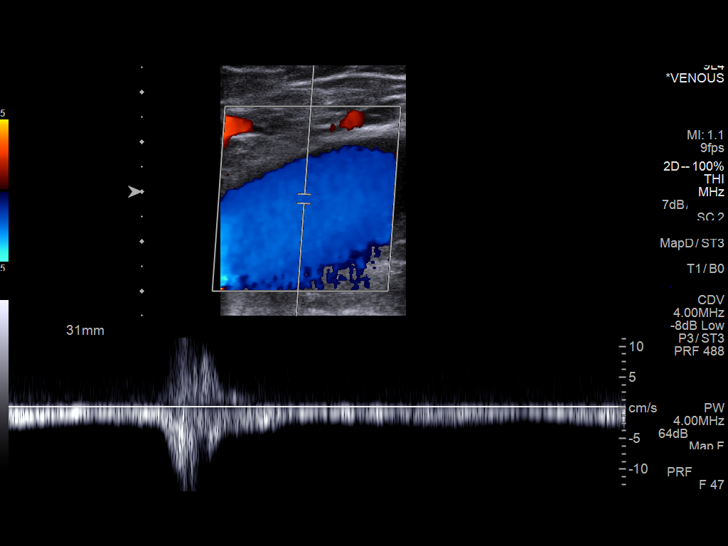
[im 8/30]
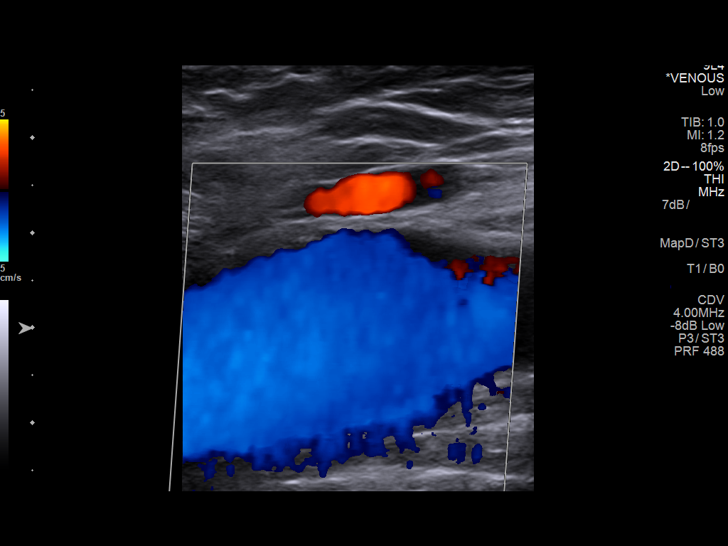
[im 11/30]
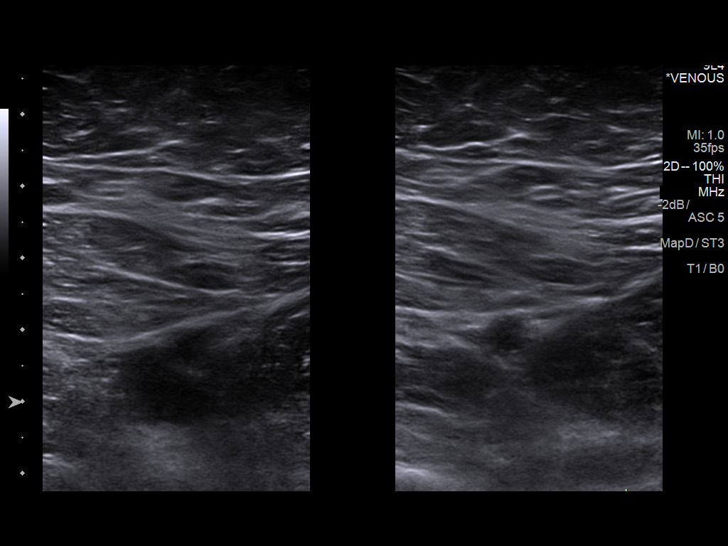
[im 13/30]
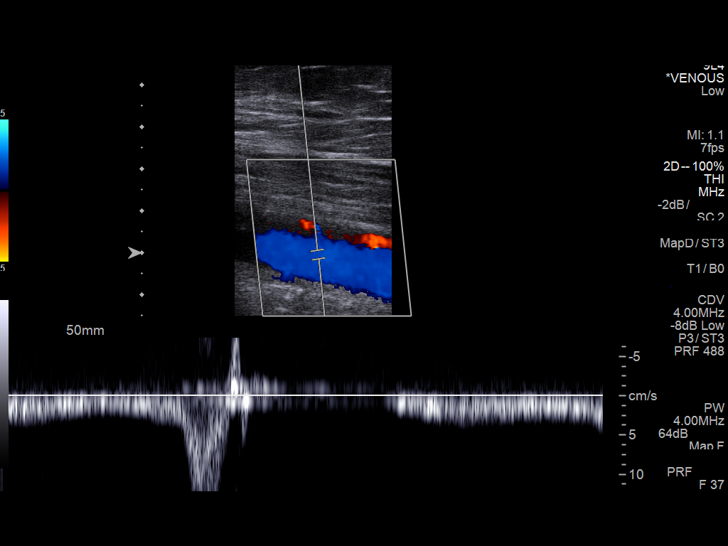
[im 16/30]
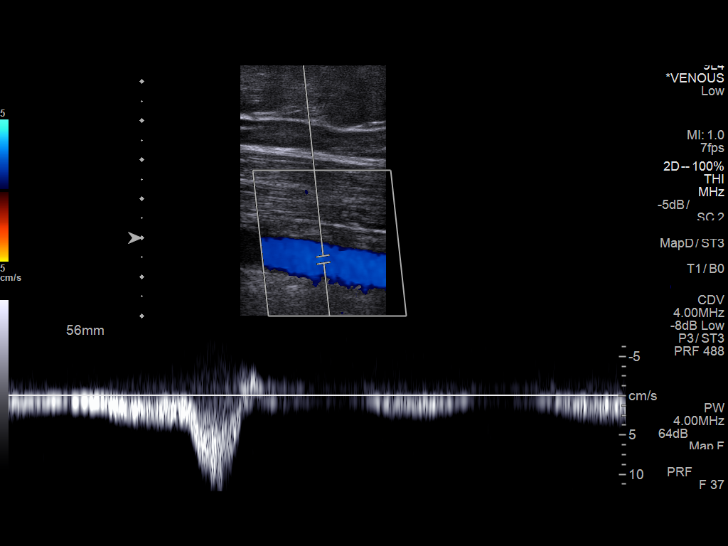
[im 17/30]
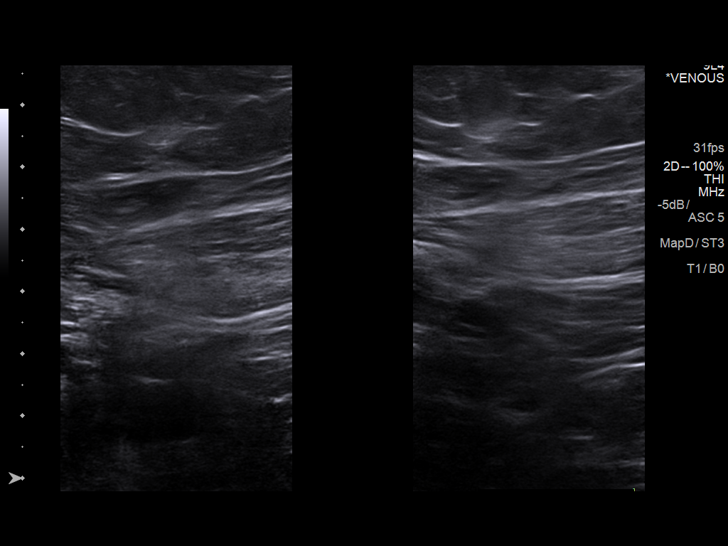
[im 19/30]
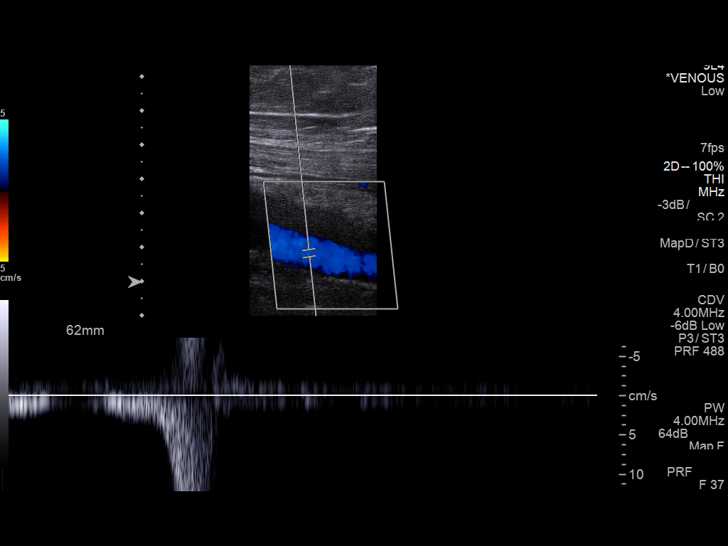
[im 22/30]
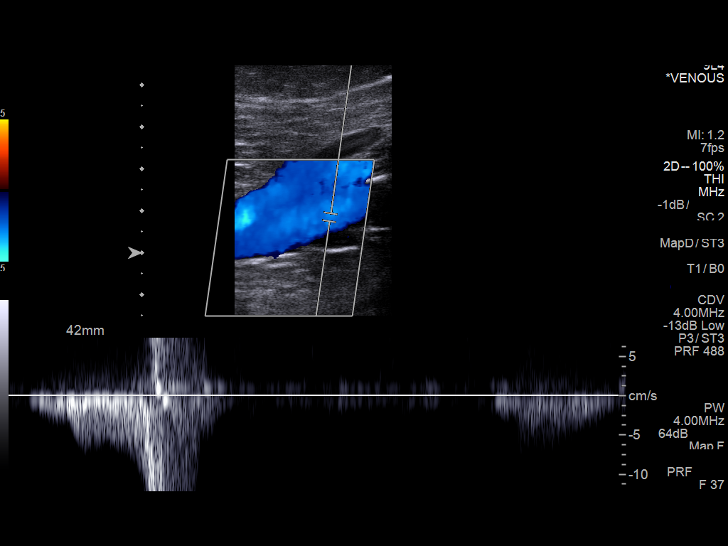
[im 24/30]
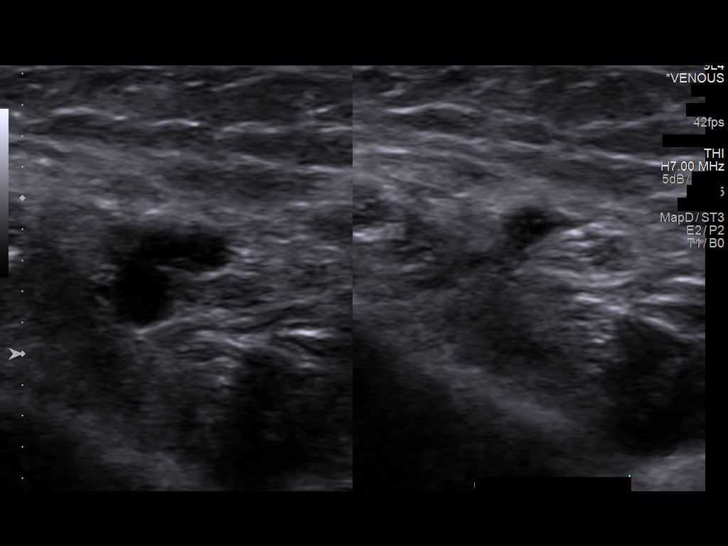
[im 27/30]
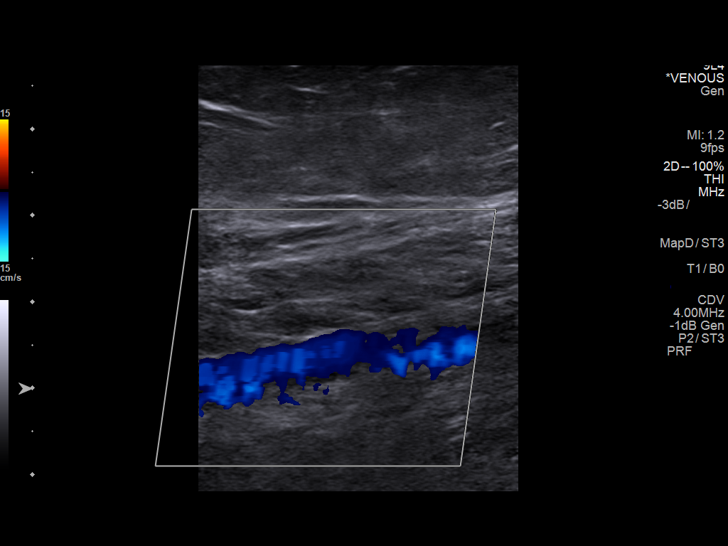
[im 30/30]
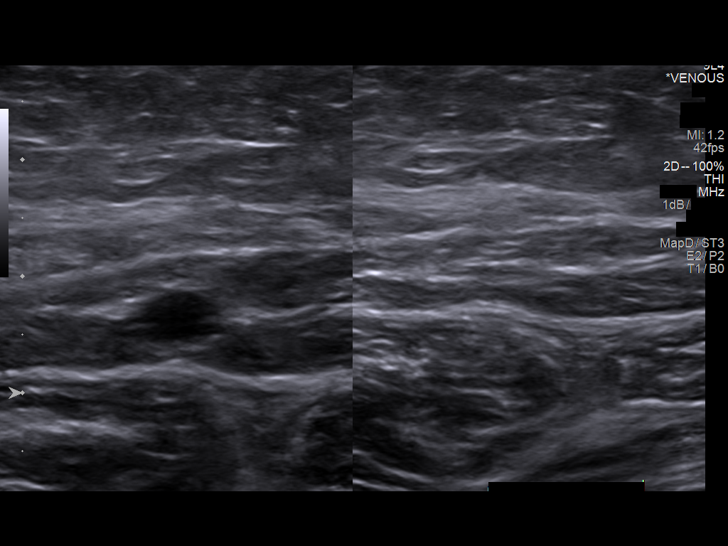

[13 of 24 positions shown; findings below may reference images not displayed]

FINDINGS: Contralateral Common Femoral Vein: Respiratory phasicity is normal
and symmetric with the symptomatic side. No evidence of thrombus.
Normal compressibility.

Common Femoral Vein: No evidence of thrombus. Normal
compressibility, respiratory phasicity and response to augmentation.

Saphenofemoral Junction: No evidence of thrombus. Normal
compressibility and flow on color Doppler imaging.

Profunda Femoral Vein: No evidence of thrombus. Normal
compressibility and flow on color Doppler imaging.

Femoral Vein: No evidence of thrombus. Normal compressibility,
respiratory phasicity and response to augmentation.

Popliteal Vein: No evidence of thrombus. Normal compressibility,
respiratory phasicity and response to augmentation.

Calf Veins: No evidence of thrombus. Normal compressibility and flow
on color Doppler imaging.

Superficial Great Saphenous Vein: No evidence of thrombus. Normal
compressibility.

Venous Reflux:  None.

Other Findings:  None.
IMPRESSION: No evidence of DVT within the right lower extremity.

## 2018-02-11 ENCOUNTER — Encounter (HOSPITAL_BASED_OUTPATIENT_CLINIC_OR_DEPARTMENT_OTHER): Payer: Self-pay | Admitting: Emergency Medicine

## 2018-02-11 ENCOUNTER — Emergency Department (HOSPITAL_BASED_OUTPATIENT_CLINIC_OR_DEPARTMENT_OTHER): Payer: No Typology Code available for payment source

## 2018-02-11 ENCOUNTER — Other Ambulatory Visit: Payer: Self-pay

## 2018-02-11 ENCOUNTER — Emergency Department (HOSPITAL_BASED_OUTPATIENT_CLINIC_OR_DEPARTMENT_OTHER)
Admission: EM | Admit: 2018-02-11 | Discharge: 2018-02-11 | Disposition: A | Discharge status: Home / Self Care | Payer: No Typology Code available for payment source | Attending: Physician Assistant | Admitting: Physician Assistant

## 2018-02-11 DIAGNOSIS — M25571 Pain in right ankle and joints of right foot: Secondary | ICD-10-CM | POA: Diagnosis not present

## 2018-02-11 DIAGNOSIS — R2241 Localized swelling, mass and lump, right lower limb: Secondary | ICD-10-CM | POA: Diagnosis present

## 2018-02-11 IMAGING — DX DG ANKLE COMPLETE 3+V*R*
3 series · 3 of 3 positions shown · non-contrast
Comparison: None.

CLINICAL DATA: Acute onset of MEDIAL RIGHT ankle pain with
weight-bearing. No known injuries.

EXAM:
RIGHT ANKLE - COMPLETE 3+ VIEW

[ankle ap]
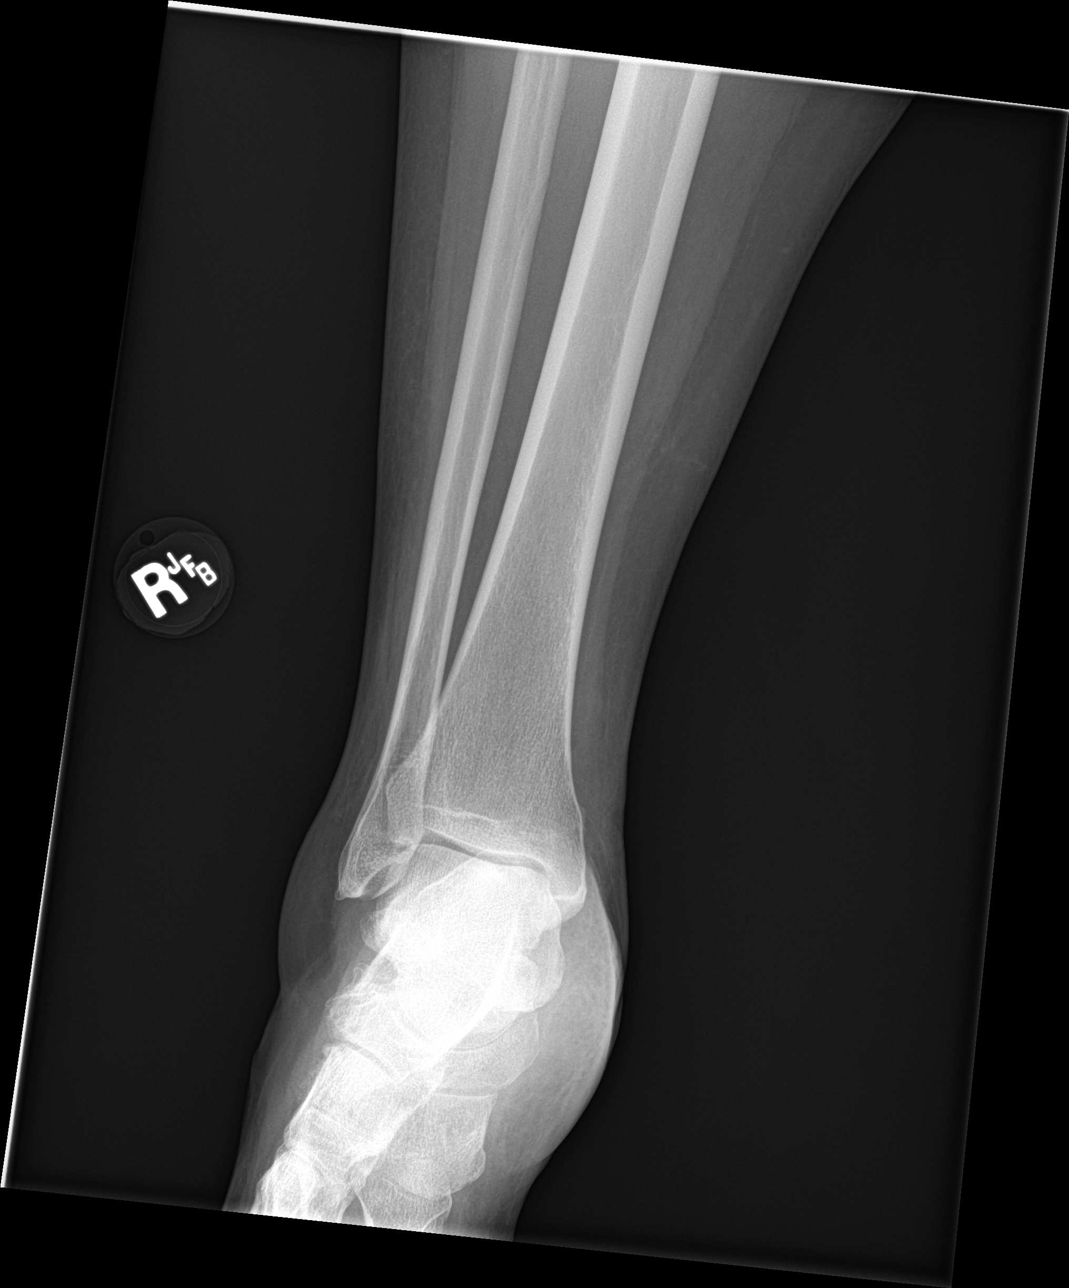

[ankle lat]
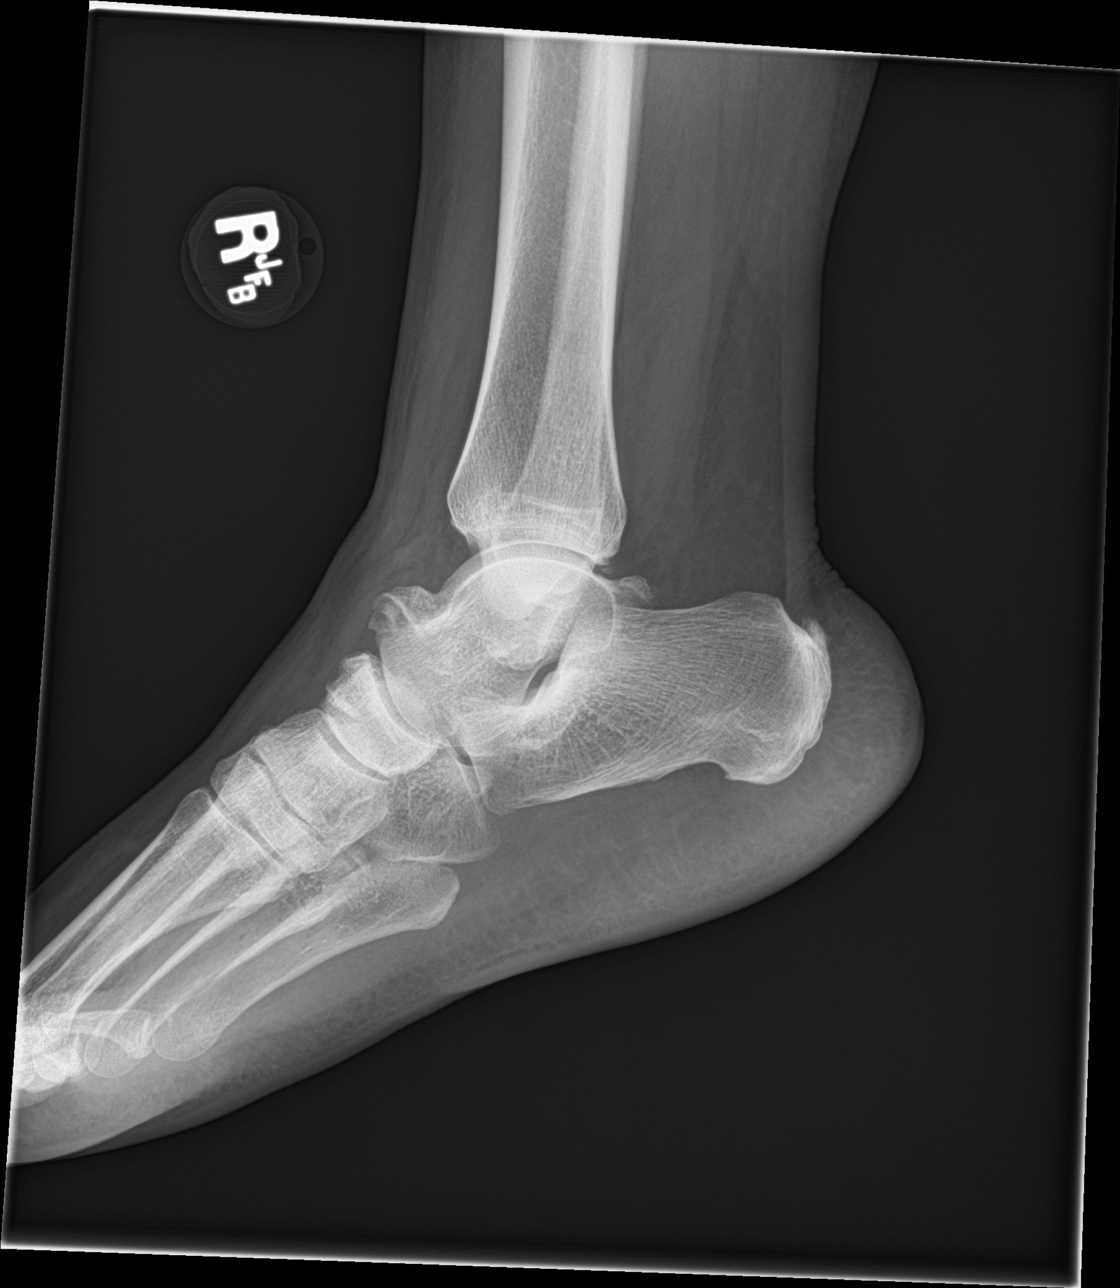

[ankle obl]
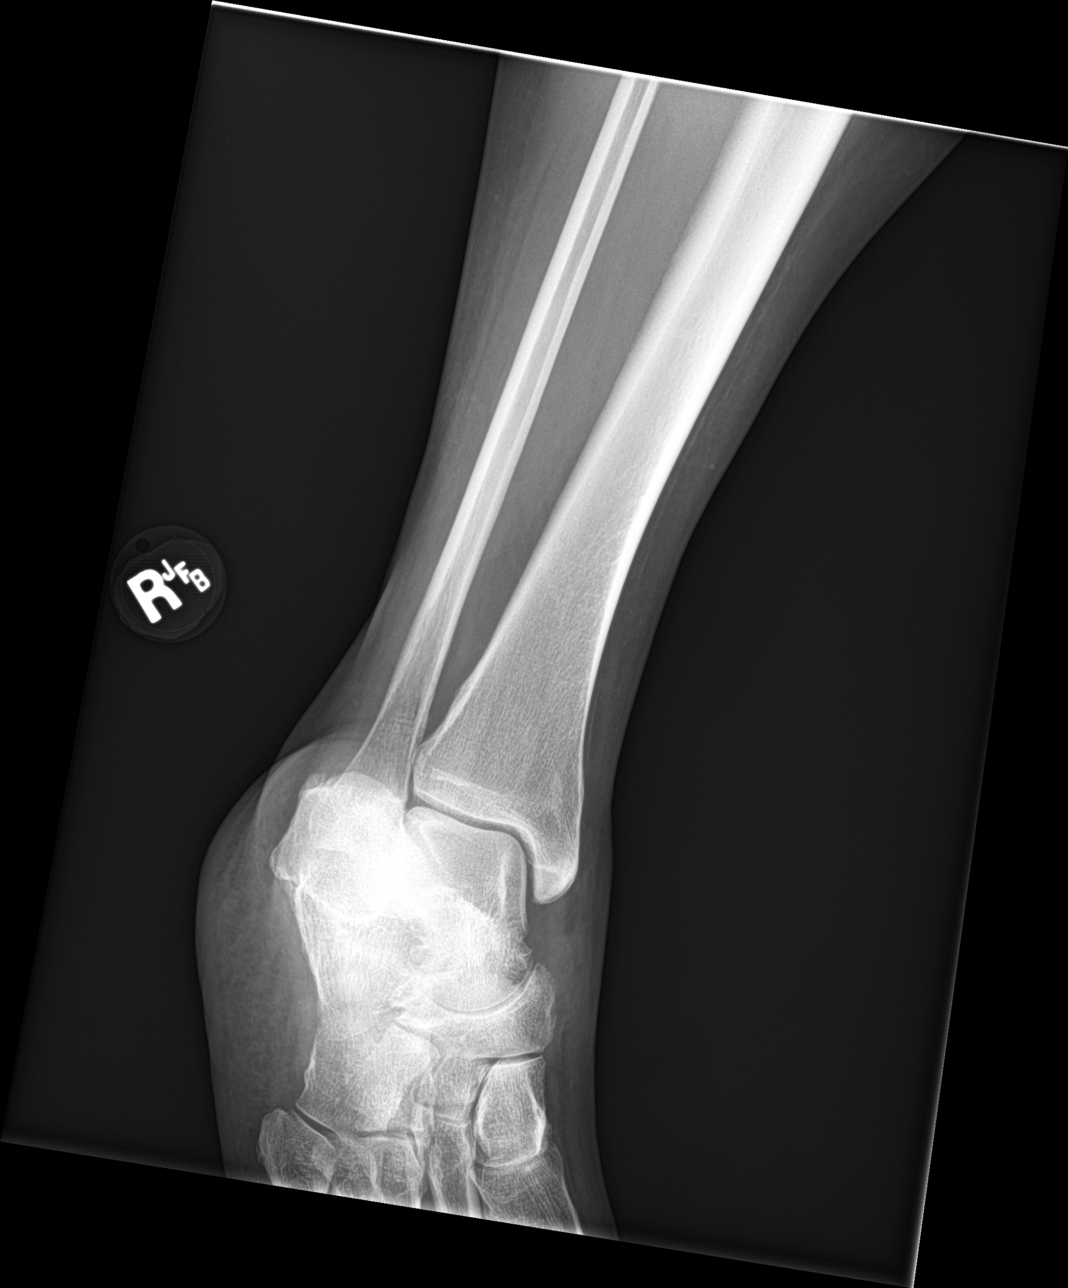

[3 of 3 positions shown; findings below may reference images not displayed]

FINDINGS: Mild MEDIAL and DORSAL soft tissue swelling. No evidence of acute,
subacute or healed fractures. Ankle mortise intact with mild
narrowing of the joint space. Well-preserved bone mineral density.
Large talar beak arising from the distal talus, possibly a fused
accessory ossicle; the DORSAL soft tissue swelling immediately
overlies this prominent beak. Small enthesopathic spur at the
insertion of the Achilles tendon on the POSTERIOR calcaneus.
IMPRESSION: 1. No acute or subacute osseous abnormality.
2. Large talar beak versus fused accessory ossicle. Soft tissue
swelling overlying this may indicate adjacent tendinitis.

## 2018-02-11 NOTE — ED Provider Notes (Signed)
[place] EMERGENCY DEPARTMENT Provider Note   CSN: [id number] Arrival date & time: 02/11/18  1339     History   Chief Complaint Chief Complaint  Patient presents with  . Leg Swelling    HPI Samantha Holden is a 47 y.o. female who presents today for pain with weightbearing in her right ankle and swelling in her right calf.  She reports that she has a history of a recent long car ride.  She denies any chest pain or shortness of breath.  No fevers or history of trauma.  She denies any abnormal exercise, has not been walking more than usual.  No history of prior blood clots.  HPI  History reviewed. No pertinent past medical history.  There are no active problems to display for this patient.   History reviewed. No pertinent surgical history.   OB History   None      Home Medications    Prior to Admission medications   Medication Sig Start Date End Date Taking? Authorizing Provider  methocarbamol (ROBAXIN) 500 MG tablet Take 1 tablet (500 mg total) by mouth 2 (two) times daily. 11/07/16   Mesner, Corene Cornea, MD    Family History History reviewed. No pertinent family history.  Social History Social History   Tobacco Use  . Smoking status: Never Smoker  . Smokeless tobacco: Never Used  Substance Use Topics  . Alcohol use: No  . Drug use: Not on file     Allergies   Patient has no known allergies.   Review of Systems Review of Systems  Constitutional: Negative for fever.  Musculoskeletal:       Leg swelling and ankle pain in the right leg.  Skin: Negative for color change and pallor.  All other systems reviewed and are negative.    Physical Exam Updated Vital Signs BP 116/86 (BP Location: Left Arm)   Pulse 69   Temp 98.1 F (36.7 C) (Oral)   Resp 18   Ht 6' (1.829 m)   Wt (!) 137 kg (302 lb 0.5 oz)   LMP 01/26/2018   SpO2 100%   BMI 40.96 kg/m   Physical Exam  Constitutional: She appears well-developed and well-nourished. No distress.    HENT:  Head: Normocephalic and atraumatic.  Eyes: Conjunctivae are normal. Right eye exhibits no discharge. Left eye exhibits no discharge. No scleral icterus.  Neck: Normal range of motion.  Cardiovascular: Normal rate, regular rhythm and intact distal pulses.  Bilateral lower legs appear symmetrical.  No significant pitting edema bilaterally.  Pulmonary/Chest: Effort normal. No stridor. No respiratory distress.  Abdominal: She exhibits no distension.  Musculoskeletal: She exhibits no edema or deformity.  Patient has full range of motion of the right ankle.  No right calf tenderness to palpation or swelling.  Right ankle and lower leg have good range of motion.  Neurological: She is alert. She exhibits normal muscle tone.  Sensation intact to right lower leg.  Skin: Skin is warm and dry. She is not diaphoretic.  Slight swelling on the right medial ankle.  Right ankle and lower leg are without obvious abnormal erythema, induration, ecchymosis, no wounds.  Psychiatric: She has a normal mood and affect. Her behavior is normal.  Nursing note and vitals reviewed.    ED Treatments / Results  Labs (all labs ordered are listed, but only abnormal results are displayed) Labs Reviewed - No data to display  EKG None  Radiology Dg Ankle Complete Right  Result Date: 02/11/2018 CLINICAL  DATA:  Acute onset of MEDIAL RIGHT ankle pain with weight-bearing. No known injuries. EXAM: RIGHT ANKLE - COMPLETE 3+ VIEW COMPARISON:  None. FINDINGS: Mild MEDIAL and DORSAL soft tissue swelling. No evidence of acute, subacute or healed fractures. Ankle mortise intact with mild narrowing of the joint space. Well-preserved bone mineral density. Large talar beak arising from the distal talus, possibly a fused accessory ossicle; the DORSAL soft tissue swelling immediately overlies this prominent beak. Small enthesopathic spur at the insertion of the Achilles tendon on the POSTERIOR calcaneus. IMPRESSION: 1. No acute or  subacute osseous abnormality. 2. Large talar beak versus fused accessory ossicle. Soft tissue swelling overlying this may indicate adjacent tendinitis. Electronically Signed   By: Evangeline Dakin M.D.   On: 02/11/2018 16:18   US Venous Img Lower Unilateral Right  Result Date: 02/11/2018 CLINICAL DATA:  Right ankle pain and swelling for the past 4 days. Evaluate for DVT. EXAM: RIGHT LOWER EXTREMITY VENOUS DOPPLER ULTRASOUND TECHNIQUE: Gray-scale sonography with graded compression, as well as color Doppler and duplex ultrasound were performed to evaluate the lower extremity deep venous systems from the level of the common femoral vein and including the common femoral, femoral, profunda femoral, popliteal and calf veins including the posterior tibial, peroneal and gastrocnemius veins when visible. The superficial great saphenous vein was also interrogated. Spectral Doppler was utilized to evaluate flow at rest and with distal augmentation maneuvers in the common femoral, femoral and popliteal veins. COMPARISON:  None. FINDINGS: Contralateral Common Femoral Vein: Respiratory phasicity is normal and symmetric with the symptomatic side. No evidence of thrombus. Normal compressibility. Common Femoral Vein: No evidence of thrombus. Normal compressibility, respiratory phasicity and response to augmentation. Saphenofemoral Junction: No evidence of thrombus. Normal compressibility and flow on color Doppler imaging. Profunda Femoral Vein: No evidence of thrombus. Normal compressibility and flow on color Doppler imaging. Femoral Vein: No evidence of thrombus. Normal compressibility, respiratory phasicity and response to augmentation. Popliteal Vein: No evidence of thrombus. Normal compressibility, respiratory phasicity and response to augmentation. Calf Veins: No evidence of thrombus. Normal compressibility and flow on color Doppler imaging. Superficial Great Saphenous Vein: No evidence of thrombus. Normal compressibility.  Venous Reflux:  None. Other Findings:  None. IMPRESSION: No evidence of DVT within the right lower extremity. Electronically Signed   By: Sandi Mariscal M.D.   On: 02/11/2018 15:08    Procedures Procedures (including critical care time)  Medications Ordered in ED Medications - No data to display   Initial Impression / Assessment and Plan / ED Course  I have reviewed the triage vital signs and the nursing notes.  Pertinent labs & imaging results that were available during my care of the patient were reviewed by me and considered in my medical decision making (see chart for details).     Patient presents today for evaluation of acute right ankle pain with weightbearing.  She was concern for swelling and has reportedly had a recent long car ride, therefore DVT study was obtained prior to my evaluation, which was negative for right leg DVT.  Not concerning for septic arthritis based on presentation, history.  Patient has full range of motion, primarily pain with weightbearing.  Discussed with patient x-ray results.  Patient was informed that she may have a stress fracture, however those would most likely not show up on x-ray.  Patient given referral to sports medicine for further evaluation of her symptoms.  Return precautions discussed and patient states her understanding.  Patient offered crutches however declined.  Over-the-counter pain medicine as needed, rice.  Final Clinical Impressions(s) / ED Diagnoses   Final diagnoses:  Acute right ankle pain    ED Discharge Orders    None       Lorin Glass, PA-C 02/12/18 0306    Macarthur Critchley, MD 02/14/18 0710

## 2018-02-11 NOTE — ED Triage Notes (Addendum)
Patient has pain to her right ankle and swelling up into her right calf and knee - patient states denies any numbness or tingling  - patient has a recent hx of long car ride  - denies any chest pain or SOB

## 2018-02-11 NOTE — Discharge Instructions (Addendum)
Today your x-rays did not show any breaks.  They did show a chronic change in your talus which is a bone in your foot, however this is most likely not contributing to your symptoms.  Please take Ibuprofen (Advil, motrin) and Tylenol (acetaminophen) to relieve your pain.  You may take up to 600 MG (3 pills) of normal strength ibuprofen every 8 hours as needed.  In between doses of ibuprofen you make take tylenol, up to 1,000 mg (two extra strength pills).  Do not take more than 3,000 mg tylenol in a 24 hour period.  Please check all medication labels as many medications such as pain and cold medications may contain tylenol.  Do not drink alcohol while taking these medications.  Do not take other NSAID'S while taking ibuprofen (such as aleve or naproxen).  Please take ibuprofen with food to decrease stomach upset.

## 2018-02-14 ENCOUNTER — Encounter: Payer: Self-pay | Admitting: Family Medicine

## 2018-02-14 ENCOUNTER — Ambulatory Visit (INDEPENDENT_AMBULATORY_CARE_PROVIDER_SITE_OTHER): Payer: No Typology Code available for payment source | Admitting: Family Medicine

## 2018-02-14 DIAGNOSIS — M25571 Pain in right ankle and joints of right foot: Secondary | ICD-10-CM | POA: Diagnosis not present

## 2018-02-14 NOTE — Patient Instructions (Signed)
You have severe posterior tibialis tendinitis and tenosynovitis. Arch support is very important for this - dr. Zoe Lan active series, spencos, superfeet, or our green sports insoles. Avoid flat shoes, barefoot walking as much as possible. When tolerated start theraband strengthening exercises with yellow theraband 3 sets of 10 once a day. Aleve 2 tabs twice a day with food for pain and inflammation - take for 7-10 days then as needed. Icing 15 minutes at a time 3-4 times a day at least. Elevate above your heart as much as possible. Follow up with me in 6 weeks for reevaluation.

## 2018-02-16 ENCOUNTER — Encounter: Payer: Self-pay | Admitting: Family Medicine

## 2018-02-16 DIAGNOSIS — M25571 Pain in right ankle and joints of right foot: Secondary | ICD-10-CM | POA: Insufficient documentation

## 2018-02-16 NOTE — Assessment & Plan Note (Signed)
2/2 severe posterior tibialis tendinitis and tenosynovitis.  Arch supports, aleve, icing.  Shown theraband strengthening to start as well.  Elevation, consider compression.  F/u in 6 weeks.

## 2018-02-16 NOTE — Progress Notes (Signed)
PCP: Patient, No Pcp Per  Subjective:   HPI: Patient is a 47 y.o. female here for right ankle pain.  Patient denies known injury or trauma. She states about 1 week ago she has had pain in right ankle. Associated swelling around the ankle. Pain is 5-6/10 and sharp with a burning sensation, feeling of pins sticking in the ankle. She had problems with this ankle around 2000 and had injection. No change in activity level prior to this. Tried tylenol, ibuprofen, crutches. Pain mainly medial. No skin changes, numbness.  History reviewed. No pertinent past medical history.  Current Outpatient Medications on File Prior to Visit  Medication Sig Dispense Refill  . methocarbamol (ROBAXIN) 500 MG tablet Take 1 tablet (500 mg total) by mouth 2 (two) times daily. 20 tablet 0   No current facility-administered medications on file prior to visit.     History reviewed. No pertinent surgical history.  No Known Allergies  Social History   Socioeconomic History  . Marital status: Married    Spouse name: Not on file  . Number of children: Not on file  . Years of education: Not on file  . Highest education level: Not on file  Occupational History  . Not on file  Social Needs  . Financial resource strain: Not on file  . Food insecurity:    Worry: Not on file    Inability: Not on file  . Transportation needs:    Medical: Not on file    Non-medical: Not on file  Tobacco Use  . Smoking status: Never Smoker  . Smokeless tobacco: Never Used  Substance and Sexual Activity  . Alcohol use: No  . Drug use: Not on file  . Sexual activity: Not on file  Lifestyle  . Physical activity:    Days per week: Not on file    Minutes per session: Not on file  . Stress: Not on file  Relationships  . Social connections:    Talks on phone: Not on file    Gets together: Not on file    Attends religious service: Not on file    Active member of club or organization: Not on file    Attends meetings of  clubs or organizations: Not on file    Relationship status: Not on file  . Intimate partner violence:    Fear of current or ex partner: Not on file    Emotionally abused: Not on file    Physically abused: Not on file    Forced sexual activity: Not on file  Other Topics Concern  . Not on file  Social History Narrative  . Not on file    History reviewed. No pertinent family history.  BP (!) 148/88   Pulse (!) 105   Ht 5\' 11"  (1.803 m)   LMP 01/26/2018   BMI 42.12 kg/m   Review of Systems: See HPI above.     Objective:  Physical Exam:  Gen: NAD, comfortable in exam room  Right ankle: Pes planus.  Mild swelling mostly medial ankle.  No bruising, other deformity.  FROM with pain on IR, 5-/5 strength.  5/5 strength other motions. TTP through post tib tendon course.  No bony, other tenderness. Negative ant drawer and talar tilt.   Negative syndesmotic compression. Thompsons test negative. NV intact distally.  Left ankle: No deformity. FROM with 5/5 strength. No tenderness to palpation. NVI distally.   MSK u/s right ankle:  Severe post tib tendinitis with neovascularity, severe tenosynovitis.  No  cortical irregularities of medial malleolus, talar dome.  Assessment & Plan:  1. Right ankle pain - 2/2 severe posterior tibialis tendinitis and tenosynovitis.  Arch supports, aleve, icing.  Shown theraband strengthening to start as well.  Elevation, consider compression.  F/u in 6 weeks.

## 2018-03-12 ENCOUNTER — Ambulatory Visit: Payer: No Typology Code available for payment source | Admitting: Family Medicine

## 2018-03-29 ENCOUNTER — Encounter: Payer: Self-pay | Admitting: Family Medicine

## 2018-03-29 ENCOUNTER — Ambulatory Visit (INDEPENDENT_AMBULATORY_CARE_PROVIDER_SITE_OTHER): Payer: No Typology Code available for payment source | Admitting: Family Medicine

## 2018-03-29 VITALS — BP 111/77 | HR 72 | Ht 71.0 in | Wt 301.4 lb

## 2018-03-29 DIAGNOSIS — M25571 Pain in right ankle and joints of right foot: Secondary | ICD-10-CM

## 2018-03-29 MED ORDER — NITROGLYCERIN 0.2 MG/HR TD PT24: MEDICATED_PATCH | TRANSDERMAL | 1 refills | Status: DC

## 2018-03-29 NOTE — Patient Instructions (Signed)
You have severe posterior tibialis tendinitis and tenosynovitis. Arch support is very important for this - dr. Zoe Lan active series, spencos, superfeet, or our green sports insoles. Avoid flat shoes, barefoot walking as much as possible. Start physical therapy and do home exercises on days you don't go to therapy. Aleve 2 tabs twice a day as needed. Icing 15 minutes at a time 3-4 times a day at least. Elevate above your heart as much as possible. Nitro patches 1/4th patch to affected ankle, change daily. Follow up with me in 6 weeks.

## 2018-03-30 ENCOUNTER — Encounter: Payer: Self-pay | Admitting: Family Medicine

## 2018-03-30 NOTE — Progress Notes (Signed)
PCP: Patient, No Pcp Per  Subjective:   HPI: Patient is a 47 y.o. female here for right ankle pain.  6/5: Patient denies known injury or trauma. She states about 1 week ago she has had pain in right ankle. Associated swelling around the ankle. Pain is 5-6/10 and sharp with a burning sensation, feeling of pins sticking in the ankle. She had problems with this ankle around 2000 and had injection. No change in activity level prior to this. Tried tylenol, ibuprofen, crutches. Pain mainly medial. No skin changes, numbness.  7/18: Patient reports her right ankle is a little better but still with 4/10 level of pain, sharp and medial. Having to use crutches still. Taking aleve, icing, ankle sleeve, doing home exercises but these are painful. No skin changes. No new injuries.  History reviewed. No pertinent past medical history.  Current Outpatient Medications on File Prior to Visit  Medication Sig Dispense Refill  . methocarbamol (ROBAXIN) 500 MG tablet Take 1 tablet (500 mg total) by mouth 2 (two) times daily. 20 tablet 0   No current facility-administered medications on file prior to visit.     History reviewed. No pertinent surgical history.  No Known Allergies  Social History   Socioeconomic History  . Marital status: Married    Spouse name: Not on file  . Number of children: Not on file  . Years of education: Not on file  . Highest education level: Not on file  Occupational History  . Not on file  Social Needs  . Financial resource strain: Not on file  . Food insecurity:    Worry: Not on file    Inability: Not on file  . Transportation needs:    Medical: Not on file    Non-medical: Not on file  Tobacco Use  . Smoking status: Never Smoker  . Smokeless tobacco: Never Used  Substance and Sexual Activity  . Alcohol use: No  . Drug use: Not on file  . Sexual activity: Not on file  Lifestyle  . Physical activity:    Days per week: Not on file    Minutes per  session: Not on file  . Stress: Not on file  Relationships  . Social connections:    Talks on phone: Not on file    Gets together: Not on file    Attends religious service: Not on file    Active member of club or organization: Not on file    Attends meetings of clubs or organizations: Not on file    Relationship status: Not on file  . Intimate partner violence:    Fear of current or ex partner: Not on file    Emotionally abused: Not on file    Physically abused: Not on file    Forced sexual activity: Not on file  Other Topics Concern  . Not on file  Social History Narrative  . Not on file    History reviewed. No pertinent family history.  BP 111/77   Pulse 72   Ht 5\' 11"  (1.803 m)   Wt (!) 301 lb 6.4 oz (136.7 kg)   BMI 42.04 kg/m   Review of Systems: See HPI above.     Objective:  Physical Exam:  Gen: NAD, comfortable in exam room  Right ankle: Pes planus.  Mild swelling medially.  No other gross deformity, ecchymoses FROM with pain on IR with 5-/5 strength.  5/5 strength other motions. TTP post tib tendon.  No other tenderness. Negative ant drawer and  talar tilt.   Negative syndesmotic compression. Thompsons test negative. NV intact distally.  Assessment & Plan:  1. Right ankle pain - 2/2 severe posterior tibialis tendinitis and tenosynovitis.  Start physical therapy and nitro patches.  Arch supports, icing, aleve.  F/u in 6 weeks.

## 2018-03-30 NOTE — Assessment & Plan Note (Signed)
2/2 severe posterior tibialis tendinitis and tenosynovitis.  Start physical therapy and nitro patches.  Arch supports, icing, aleve.  F/u in 6 weeks.

## 2018-04-09 ENCOUNTER — Other Ambulatory Visit: Payer: Self-pay

## 2018-04-09 ENCOUNTER — Encounter: Payer: Self-pay | Admitting: Physical Therapy

## 2018-04-09 ENCOUNTER — Ambulatory Visit: Payer: No Typology Code available for payment source | Attending: Family Medicine | Admitting: Physical Therapy

## 2018-04-09 DIAGNOSIS — M25571 Pain in right ankle and joints of right foot: Secondary | ICD-10-CM | POA: Diagnosis present

## 2018-04-09 DIAGNOSIS — R2689 Other abnormalities of gait and mobility: Secondary | ICD-10-CM | POA: Diagnosis present

## 2018-04-09 DIAGNOSIS — M6281 Muscle weakness (generalized): Secondary | ICD-10-CM | POA: Insufficient documentation

## 2018-04-09 NOTE — Therapy (Signed)
[facility] [address]  [place], [place], [address] Phone: [phone]   Fax:  [phone]  Physical Therapy Evaluation  Patient Details  Name: Samantha Holden MRN: [id number] Date of Birth: [birth year] Referring Provider: Karlton Lemon, MD   Encounter Date: 04/09/2018  PT End of Session - 04/09/18 1832    Visit Number  1    Number of Visits  12    Date for PT Re-Evaluation  05/21/18    PT Start Time  1275    PT Stop Time  1627    PT Time Calculation (min)  53 min    Activity Tolerance  Patient tolerated treatment well    Behavior During Therapy  [facility] for tasks assessed/performed       History reviewed. No pertinent past medical history.  History reviewed. No pertinent surgical history.  There were no vitals filed for this visit.   Subjective Assessment - 04/09/18 1927    Subjective  Pt states that she was mowing the yard the day before the onset of pain. The next morning she was unable to put weight on the R LE and went to the ED. Followed up with Dr. Barbaraann Barthel on 03/29/2018. Since then pt states her pain has been slowly improving.     Limitations  Standing;Walking;House hold activities    How long can you stand comfortably?  <5 min    How long can you walk comfortably?  <5 min    Currently in Pain?  Yes    Pain Score  4  sometimes up to a 9 with walking    Pain Location  Ankle    Pain Orientation  Right    Pain Descriptors / Indicators  Aching;Dull    Pain Type  Acute pain    Pain Onset  1 to 4 weeks ago    Pain Frequency  Intermittent    Aggravating Factors   Standing, walking, putting weight through the R leg    Pain Relieving Factors  Ice, Rest    Effect of Pain on Daily Activities  Unable to garden and go shopping like she used to         [facility] PT Assessment - 04/09/18 0001      Assessment   Medical Diagnosis  Right Ankle Pain    Referring Provider  Karlton Lemon, MD    Onset Date/Surgical  Date  02/10/18    Next MD Visit  05/10/2018    Prior Therapy  No      Precautions   Required Braces or Orthoses  --    Other Brace/Splint  Pt wears orthotic shoe inserts provided by Dr. Barbaraann Barthel      Balance Screen   Has the patient fallen in the past 6 months  No    Has the patient had a decrease in activity level because of a fear of falling?   No    Is the patient reluctant to leave their home because of a fear of falling?   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    Leisure  Gardening, yard work, Actor      Observation/Other Assessments   Focus on Therapeutic Outcomes (FOTO)   Intake - status 38% (limitation 62%); predicted - status 60% (40% limitation)      Functional Tests   Functional tests  Squat      Squat   Comments  Good form with no symptoms      ROM / Strength   AROM / PROM / Strength  AROM;PROM;Strength      AROM   AROM Assessment Site  Ankle    Right/Left Ankle  Right;Left    Right Ankle Dorsiflexion  2    Right Ankle Plantar Flexion  43    Right Ankle Inversion  12    Right Ankle Eversion  18      PROM   PROM Assessment Site  --    Right/Left Ankle  --    Right Ankle Dorsiflexion  --    Right Ankle Plantar Flexion  --    Right Ankle Inversion  --    Right Ankle Eversion  --      Strength   Strength Assessment Site  Ankle    Right/Left Ankle  Right;Left    Right Ankle Dorsiflexion  4+/5    Right Ankle Plantar Flexion  1/5    Right Ankle Inversion  1/5    Right Ankle Eversion  4+/5      Special Tests    Special Tests  --    Other special tests  R forefoot  mobility; Increased mobility noted between metatarsals with pt expressing pain/discomfort with PROM of joints      Ambulation/Gait   Ambulation/Gait  Yes    Ambulation/Gait Assistance  7: Independent    Ambulation Distance (Feet)  30 Feet    Gait Pattern  Decreased stance time - right;Decreased step length - left;Decreased arm swing - right;Decreased weight shift to right;Antalgic    Ambulation Surface  Level;Indoor    Gait Comments  Pt ambs with crutch but observed without AD                Objective measurements completed on examination: See above findings.      [facility] Adult PT Treatment/Exercise - 04/09/18 0001      Exercises   Exercises  Ankle      Modalities   Modalities  Vasopneumatic      Vasopneumatic   Number Minutes Vasopneumatic   15 minutes    Vasopnuematic Location   Ankle    Vasopneumatic Pressure  Medium    Vasopneumatic Temperature   Lowest      Ankle Exercises: Seated   Other Seated Ankle Exercises  R ankle plantarflexion with yellow TB; 10 reps; VC to prevent excessive eversion during movement      Ankle Exercises: Supine   Isometrics  Ankle inversion isometric into pillow held between ankles; 10 reps; 5 sec hold      Ankle Exercises: Sidelying   Ankle Inversion  10 reps;AAROM    Ankle Inversion Limitations  R sidelying; using L LE to assist with inversion and then focusing on controling "fall" of R foot; 10 reps          Balance Exercises - 04/09/18 1648      Balance Exercises: Standing   Standing Eyes Opened  --    Standing Eyes Closed  --    Tandem  Stance  --    Rockerboard  --    Tandem Gait  --      Balance Exercises: Standing   Rebounder Limitations  --    Tandem Gait Limitations  --        PT Education - 04/09/18 1832    Education Details  Pt educated on results of examination, HEP exercises and POC    Person(s) Educated  Patient    Methods  Explanation;Demonstration;Verbal cues;Handout    Comprehension  Verbalized  understanding;Returned demonstration       PT Short Term Goals - 04/09/18 1926      PT SHORT TERM GOAL #1   Title  Pt will be independent with initial HEP    Status  New    Target Date  04/23/18        PT Long Term Goals - 04/09/18 1926      PT LONG TERM GOAL #1   Title  Pt will be independent with advanced HEP    Status  New    Target Date  05/21/18      PT LONG TERM GOAL #2   Title  Pt will report improved ability to perform yard work without increased pain    Status  New    Target Date  05/21/18      PT LONG TERM GOAL #3   Title  Pt will have R ankle AROM WNL in all directions to improve functional mobility and gait abnormalities     Status  New    Target Date  05/21/18      PT LONG TERM GOAL #4   Title  Pt will have global R ankle strength 4+/5 to improve functional mobility and tolerance for weightbearing activities     Status  New    Target Date  05/21/18      PT LONG TERM GOAL #5   Title  Pt will demonstrate normal gait with equal WB and no increased pain to improve tolerance to functional mobility    Status  New    Target Date  05/21/18      Additional Long Term Goals   Additional Long Term Goals  Yes      PT LONG TERM GOAL #6   Title  Pt will report abilty to stand for 2 hours or greater to improve tolerance to work-related tasks and ability to shop to PLOF    Status  New    Target Date  05/21/18             Plan - 04/09/18 1837    Clinical Impression Statement  Pt is a 47 y/o F who presents to OP PT with R ankle pain secondary to R posterior tibialis tendonitis. Examination revealed decreased R ankle AROM into inversion and plantarflexion, gait abnormalities including decreased stance time on the R LE, and decreased R ankle strength. These impairments are limiting pt's ability to amb without pain or compensation, work in the yard and garden at her house, and cause her pain when having to stand for long periods of time which is required of her at one  of her jobs. Her prognosis is positively impacted by the acute nature of the condition and motivation to return to work.  She will benefit from physical therapy to address these impairments and progress toward functional goals.     Clinical Presentation  Stable    Clinical Decision Making  Low    Rehab Potential  Good    PT  Frequency  2x / week    PT Duration  6 weeks    PT Treatment/Interventions  ADLs/Self Care Home Management;Cryotherapy;Electrical Stimulation;Iontophoresis 4mg /ml Dexamethasone;Moist Heat;Ultrasound;DME Instruction;Gait training;Stair training;Therapeutic activities;Therapeutic exercise;Balance training;Neuromuscular re-education;Patient/family education;Manual techniques;Passive range of motion;Dry needling;Vasopneumatic Device;Taping    Consulted and Agree with Plan of Care  Patient       Patient will benefit from skilled therapeutic intervention in order to improve the following deficits and impairments:  Abnormal gait, Decreased activity tolerance, Decreased strength, Pain, Decreased balance, Decreased mobility, Difficulty walking, Increased muscle spasms, Decreased range of motion  Visit Diagnosis: Pain in right ankle and joints of right foot  Other abnormalities of gait and mobility  Muscle weakness (generalized)     Problem List Patient Active Problem List   Diagnosis Date Noted  . Right ankle pain 02/16/2018    Shirline Frees, SPT 04/09/2018, 7:38 PM  [facility] [address]  [place], [place], [address] Phone: [phone]   Fax:  [phone]  Name: TORIN WHISNER MRN: [id number] Date of Birth: Feb 07, 1971

## 2018-04-10 ENCOUNTER — Ambulatory Visit: Payer: No Typology Code available for payment source

## 2018-04-16 ENCOUNTER — Ambulatory Visit: Payer: No Typology Code available for payment source | Attending: Family Medicine

## 2018-04-16 DIAGNOSIS — M25571 Pain in right ankle and joints of right foot: Secondary | ICD-10-CM | POA: Diagnosis present

## 2018-04-16 DIAGNOSIS — M6281 Muscle weakness (generalized): Secondary | ICD-10-CM | POA: Diagnosis present

## 2018-04-16 DIAGNOSIS — R2689 Other abnormalities of gait and mobility: Secondary | ICD-10-CM | POA: Insufficient documentation

## 2018-04-16 NOTE — Therapy (Signed)
[facility] [address]  [place], [place], [address] Phone: [phone]   Fax:  [phone]  Physical Therapy Treatment  Patient Details  Name: Samantha Holden MRN: [id number] Date of Birth: [birth year] Referring Provider: Karlton Lemon, MD   Encounter Date: 04/16/2018  PT End of Session - 04/16/18 1546    Visit Number  2    Number of Visits  12    Date for PT Re-Evaluation  05/21/18    PT Start Time  1532    PT Stop Time  1610    PT Time Calculation (min)  38 min    Activity Tolerance  Patient tolerated treatment well    Behavior During Therapy  [facility] for tasks assessed/performed       No past medical history on file.  No past surgical history on file.  There were no vitals filed for this visit.  Subjective Assessment - 04/16/18 1806    Subjective  reports benefit from HEP activities with less pain.      Currently in Pain?  Yes    Pain Score  2     Pain Location  Ankle    Pain Orientation  Right    Pain Descriptors / Indicators  Aching;Dull    Pain Type  Acute pain    Pain Onset  1 to 4 weeks ago    Pain Frequency  Intermittent    Aggravating Factors   standing, walking     Pain Relieving Factors  ice, rest    Multiple Pain Sites  No                       [facility] Adult PT Treatment/Exercise - 04/16/18 1549      Ankle Exercises: Seated   Heel Raises  15 reps;3 seconds    Toe Raise  15 reps;3 seconds    BAPS  Sitting;Level 2;10 reps    BAPS Limitations  PF/DF, CW, CCW     Other Seated Ankle Exercises  R ankle DF, PF, EV, IV with yellow TB x 15 reps      Ankle Exercises: Stretches   Gastroc Stretch  2 reps;30 seconds    Gastroc Stretch Limitations  R; runners stretch     Other Stretch  R Pos Tibialis "Runner's stretch" stretch with towel under lateral foot 2 x 30 sec       Ankle Exercises: Standing   Heel Raises  10 reps;Both some UE pushoff from counter       Ankle Exercises:  Aerobic   Recumbent Bike  Lvl 2, 6 min              PT Education - 04/16/18 1626    Education Details  HEP update    Person(s) Educated  Patient    Methods  Explanation    Comprehension  Verbalized understanding;Returned demonstration;Verbal cues required;Need further instruction       PT Short Term Goals - 04/16/18 1546      PT SHORT TERM GOAL #1   Title  Pt will be independent with initial HEP    Status  On-going        PT Long Term Goals - 04/16/18 1547      PT LONG TERM GOAL #1   Title  Pt will be independent with advanced HEP    Status  On-going      PT LONG TERM GOAL #2   Title  Pt  will report improved ability to perform yard work without increased pain    Status  On-going      PT LONG TERM GOAL #3   Title  Pt will have R ankle AROM WNL in all directions to improve functional mobility and gait abnormalities     Status  On-going      PT LONG TERM GOAL #4   Title  Pt will have global R ankle strength 4+/5 to improve functional mobility and tolerance for weightbearing activities     Status  On-going      PT LONG TERM GOAL #5   Title  Pt will demonstrate normal gait with equal WB and no increased pain to improve tolerance to functional mobility    Status  On-going      PT LONG TERM GOAL #6   Title  Pt will report abilty to stand for 2 hours or greater to improve tolerance to work-related tasks and ability to shop to [place] - 04/16/18 [place] doing well today noting benefit from HEP activities with less pain.  Tolerated all seated and standing ankle ROM and strengthening activities in session well today.  Did have short-lasting, "burning" medial ankle pain following standing Posterior Tibialis stretch, which subsided.  HEP updated.  Will continue to progress toward goals.      PT Treatment/Interventions  ADLs/Self Care Home Management;Cryotherapy;Electrical Stimulation;Iontophoresis  4mg /ml Dexamethasone;Moist Heat;Ultrasound;DME Instruction;Gait training;Stair training;Therapeutic activities;Therapeutic exercise;Balance training;Neuromuscular re-education;Patient/family education;Manual techniques;Passive range of motion;Dry needling;Vasopneumatic Device;Taping    Consulted and Agree with Plan of Care  Patient       Patient will benefit from skilled therapeutic intervention in order to improve the following deficits and impairments:  Abnormal gait, Decreased activity tolerance, Decreased strength, Pain, Decreased balance, Decreased mobility, Difficulty walking, Increased muscle spasms, Decreased range of motion  Visit Diagnosis: Pain in right ankle and joints of right foot  Other abnormalities of gait and mobility  Muscle weakness (generalized)     Problem List Patient Active Problem List   Diagnosis Date Noted  . Right ankle pain 02/16/2018    Bess Harvest, PTA 04/16/18 6:13 PM   [facility] [address]  [place], [place], [address] Phone: [phone]   Fax:  [phone]  Name: Samantha Holden MRN: [id number] Date of Birth: 12-03-1970

## 2018-04-19 ENCOUNTER — Ambulatory Visit: Payer: No Typology Code available for payment source | Admitting: Physical Therapy

## 2018-04-19 DIAGNOSIS — M25571 Pain in right ankle and joints of right foot: Secondary | ICD-10-CM | POA: Diagnosis not present

## 2018-04-19 DIAGNOSIS — M6281 Muscle weakness (generalized): Secondary | ICD-10-CM

## 2018-04-19 DIAGNOSIS — R2689 Other abnormalities of gait and mobility: Secondary | ICD-10-CM

## 2018-04-19 NOTE — Therapy (Signed)
[facility] [address]  [place], [place], [address] Phone: [phone]   Fax:  [phone]  Physical Therapy Treatment  Patient Details  Name: Samantha Holden MRN: [id number] Date of Birth: 1970-11-07 Referring Provider: Karlton Lemon, MD   Encounter Date: 04/19/2018  PT End of Session - 04/19/18 1536    Visit Number  3    Number of Visits  12    Date for PT Re-Evaluation  05/21/18    PT Start Time  6834    PT Stop Time  1630    PT Time Calculation (min)  55 min       No past medical history on file.  No past surgical history on file.  There were no vitals filed for this visit.  Subjective Assessment - 04/19/18 1537    Subjective  Pt reports she continues to ice 2x/day.  She uses crutch during the day at work, "it helps me with my balance".   She reports her ankle swelled yesterday, "I think it was due to the rain and exercises"       Currently in Pain?  Yes    Pain Score  4     Pain Location  Ankle    Pain Orientation  Right    Pain Descriptors / Indicators  Dull;Aching    Aggravating Factors   standing, walking    Pain Relieving Factors  ice, elevation, rest.          [facility] PT Assessment - 04/19/18 0001      Assessment   Medical Diagnosis  Right Ankle Pain    Referring Provider  Karlton Lemon, MD    Onset Date/Surgical Date  02/10/18    Next MD Visit  05/10/2018      Strength   Right/Left Ankle  Right    Right Ankle Plantar Flexion  2-/5    Right Ankle Inversion  2-/5       [facility] Adult PT Treatment/Exercise - 04/19/18 0001      Vasopneumatic   Number Minutes Vasopneumatic   15 minutes    Vasopnuematic Location   Ankle    Vasopneumatic Pressure  Medium    Vasopneumatic Temperature   lowest      Manual Therapy   Manual Therapy  Soft tissue mobilization;Taping    Manual therapy comments  Regular rock tape applied with 15% stretch - posterior tib (arch of foot to mid medial shin); 2  strips with 25% stretch applied to plantar fascia and one strip applied perpendicular; perpendicular strip with 50% stretch applied to painful area on post tib tendon (medial ankle, just above malleoli) - tape applied for support, to decrease swelling, and provide pain relief.    Soft tissue mobilization  STM to Rt posterior tib, flex hallicus longus, flexor digitorum longus.       Ankle Exercises: Stretches   Soleus Stretch  3 reps;30 seconds;Limitations    Soleus Stretch Limitations  seated x 1 rep with strap    Gastroc Stretch  2 reps;30 seconds;Limitations    Gastroc Stretch Limitations  seated x 1 rep    Other Stretch  Rt calf/hamstring stretch in supine with strap x 45 sec x 2 reps      Ankle Exercises: Seated   Ankle Circles/Pumps  AROM;Right;20 reps    Heel Raises  10 reps    Toe Raise  10 reps    Other Seated Ankle Exercises  Rt inversion isometrics x 5  sec x 10 rep      Ankle Exercises: Aerobic   Recumbent Bike  L2: 6 min - PTA present to discuss progress and monitor             PT Education - 04/19/18 1631    Education Details  [place] info, return to original HEP.     Person(s) Educated  Patient    Methods  Explanation    Comprehension  Verbalized understanding       PT Short Term Goals - 04/16/18 1546      PT SHORT TERM GOAL #1   Title  Pt will be independent with initial HEP    Status  On-going        PT Long Term Goals - 04/16/18 1547      PT LONG TERM GOAL #1   Title  Pt will be independent with advanced HEP    Status  On-going      PT LONG TERM GOAL #2   Title  Pt will report improved ability to perform yard work without increased pain    Status  On-going      PT LONG TERM GOAL #3   Title  Pt will have R ankle AROM WNL in all directions to improve functional mobility and gait abnormalities     Status  On-going      PT LONG TERM GOAL #4   Title  Pt will have global R ankle strength 4+/5 to improve functional mobility and tolerance for  weightbearing activities     Status  On-going      PT LONG TERM GOAL #5   Title  Pt will demonstrate normal gait with equal WB and no increased pain to improve tolerance to functional mobility    Status  On-going      PT LONG TERM GOAL #6   Title  Pt will report abilty to stand for 2 hours or greater to improve tolerance to work-related tasks and ability to shop to [place] - 04/19/18 1628    Clinical Impression Statement  Pt presents with a flare up of symptoms since last visit.  Returned to exercises from initial visit with good tolerance.  Trial of regular Rock tape applied to Rt ankle.  Pt reported reduction in pain at end of session after use of vaso.  No new goals met yet.     Rehab Potential  Good    PT Frequency  2x / week    PT Duration  6 weeks    PT Treatment/Interventions  ADLs/Self Care Home Management;Cryotherapy;Electrical Stimulation;Iontophoresis 19m/ml Dexamethasone;Moist Heat;Ultrasound;DME Instruction;Gait training;Stair training;Therapeutic activities;Therapeutic exercise;Balance training;Neuromuscular re-education;Patient/family education;Manual techniques;Passive range of motion;Dry needling;Vasopneumatic Device;Taping    PT Next Visit Plan  measure ROM.  assess response to tape.  possible trial of ionto.      Consulted and Agree with Plan of Care  Patient       Patient will benefit from skilled therapeutic intervention in order to improve the following deficits and impairments:  Abnormal gait, Decreased activity tolerance, Decreased strength, Pain, Decreased balance, Decreased mobility, Difficulty walking, Increased muscle spasms, Decreased range of motion  Visit Diagnosis: Pain in right ankle and joints of right foot  Other abnormalities of gait and mobility  Muscle weakness (generalized)     Problem List Patient Active Problem List   Diagnosis Date Noted  . Right ankle pain 02/16/2018   JAnderson Malta  Carl[place],  PTA 04/19/18 4:44 PM  [facility] [address]  [place], [place], [address] Phone: [phone]   Fax:  [phone]  Name: Samantha Holden MRN: [id number] Date of Birth: [birth year]

## 2018-04-23 ENCOUNTER — Encounter: Payer: Self-pay | Admitting: Physical Therapy

## 2018-04-23 ENCOUNTER — Ambulatory Visit: Payer: No Typology Code available for payment source | Admitting: Physical Therapy

## 2018-04-23 DIAGNOSIS — M25571 Pain in right ankle and joints of right foot: Secondary | ICD-10-CM | POA: Diagnosis not present

## 2018-04-23 DIAGNOSIS — M6281 Muscle weakness (generalized): Secondary | ICD-10-CM

## 2018-04-23 DIAGNOSIS — R2689 Other abnormalities of gait and mobility: Secondary | ICD-10-CM

## 2018-04-23 NOTE — Therapy (Addendum)
[facility] [address]  [place], [place], [address] Phone: [phone]   Fax:  [phone]  Physical Therapy Treatment  Patient Details  Name: Samantha Holden MRN: [id number] Date of Birth: [birth year] Referring Provider: Karlton Lemon, MD   Encounter Date: 04/23/2018  PT End of Session - 04/23/18 1701    Visit Number  4    Number of Visits  12    Date for PT Re-Evaluation  05/21/18    PT Start Time  6712    PT Stop Time  1700    PT Time Calculation (min)  43 min    Activity Tolerance  Patient tolerated treatment well    Behavior During Therapy  [facility] for tasks assessed/performed       History reviewed. No pertinent past medical history.  History reviewed. No pertinent surgical history.  There were no vitals filed for this visit.  Subjective Assessment - 04/23/18 1625    Subjective  Pt reports that she is well today but she was unable to ice the ankle this morning. She really likes the exercises that she can incorporate into her day at work, and states that she has not been using the crutch as much to get around at work. Pt states that she has received the orthotic but has not been wearing it because she does not like it. She says that it makes her foot hurt.     Limitations  Standing;Walking;House hold activities    Currently in Pain?  Yes    Pain Score  4     Pain Location  Ankle    Pain Orientation  Right    Pain Descriptors / Indicators  Aching;Sharp;Dull    Pain Frequency  Intermittent                       [facility] Adult PT Treatment/Exercise - 04/23/18 0001      Manual Therapy   Manual Therapy  Taping;Soft tissue mobilization    Soft tissue mobilization  To R posterior tib muscle. Pt had minor TTP behind R medial malleolus but was much improved from two visits ago    Ethete tape applied with 30% stretch from medial arch  of R LE the length of R fibularis musulature. Two supporting pieces applied with 15% stretch for arch support to prevent placing stress on the posterior tib tendon during pronation       Ankle Exercises: Seated   Marble Pickup  2 x 15 marbles; cup placed to the L of pt's R foot to encourage inversion when placing marble in cup    Heel Raises  10 reps;Both      Ankle Exercises: Standing   Heel Raises  20 reps;Both    Heel Raises Limitations  2 sets of 20 reps; 1st set standing with bil UE support. Pt unable to control deceent or get full ROM so transitioned to off of 2" step height. Pt instructed to perform heel raise bil then transition approx 75% of weight onto R LE for lowering phase of movement    Other Standing Ankle Exercises  Heel raise with isometric inversion hold; 1 x 10 reps; holding ball between medial malleoli raising heels with forefeet starting on 2" step height       Ankle Exercises: Aerobic   Recumbent Bike  L3 x 6 min  PT Education - 04/23/18 1826    Education Details  Pt provided with new HEP exercises, and education on gradually introducing orthotic.     Person(s) Educated  Patient    Methods  Explanation;Handout    Comprehension  Verbalized understanding;Returned demonstration       PT Short Term Goals - 04/16/18 1546      PT SHORT TERM GOAL #1   Title  Pt will be independent with initial HEP    Status  On-going        PT Long Term Goals - 04/16/18 1547      PT LONG TERM GOAL #1   Title  Pt will be independent with advanced HEP    Status  On-going      PT LONG TERM GOAL #2   Title  Pt will report improved ability to perform yard work without increased pain    Status  On-going      PT LONG TERM GOAL #3   Title  Pt will have R ankle AROM WNL in all directions to improve functional mobility and gait abnormalities     Status  On-going      PT LONG TERM GOAL #4   Title  Pt will have global R ankle strength 4+/5 to improve functional  mobility and tolerance for weightbearing activities     Status  On-going      PT LONG TERM GOAL #5   Title  Pt will demonstrate normal gait with equal WB and no increased pain to improve tolerance to functional mobility    Status  On-going      PT LONG TERM GOAL #6   Title  Pt will report abilty to stand for 2 hours or greater to improve tolerance to work-related tasks and ability to shop to [place] - 04/23/18 1830    Clinical Impression Statement  Pt tolerated treatment session well today focusing on manual therapy and strengthening the muscles of the R foot. She continues to have difficulty with any activity involving plantarflexion of the R ankle, but demonstrates improved range and better control with heel lowering activity today. She will continue to benefit from physical therapy to address her pain, gait abnormalities secondary to compensations, as well as increase her strength and progress toward functional goals.     Rehab Potential  Good    PT Treatment/Interventions  ADLs/Self Care Home Management;Cryotherapy;Electrical Stimulation;Iontophoresis 4mg /ml Dexamethasone;Moist Heat;Ultrasound;DME Instruction;Gait training;Stair training;Therapeutic activities;Therapeutic exercise;Balance training;Neuromuscular re-education;Patient/family education;Manual techniques;Passive range of motion;Dry needling;Vasopneumatic Device;Taping    PT Next Visit Plan  Possible trial of ionto    Consulted and Agree with Plan of Care  Patient       Patient will benefit from skilled therapeutic intervention in order to improve the following deficits and impairments:  Abnormal gait, Decreased activity tolerance, Decreased strength, Pain, Decreased balance, Decreased mobility, Difficulty walking, Increased muscle spasms, Decreased range of motion  Visit Diagnosis: Pain in right ankle and joints of right foot  Other abnormalities of gait and mobility  Muscle weakness  (generalized)     Problem List Patient Active Problem List   Diagnosis Date Noted  . Right ankle pain 02/16/2018    Shirline Frees, SPT 04/23/2018, 7:17 PM  [facility] [address]  [place], [place], [address] Phone: [phone]   Fax:  [phone]  Name: Samantha Holden MRN:  [id number] Date of Birth: 1971/03/24

## 2018-04-26 ENCOUNTER — Ambulatory Visit: Payer: No Typology Code available for payment source

## 2018-04-26 DIAGNOSIS — M6281 Muscle weakness (generalized): Secondary | ICD-10-CM

## 2018-04-26 DIAGNOSIS — M25571 Pain in right ankle and joints of right foot: Secondary | ICD-10-CM | POA: Diagnosis not present

## 2018-04-26 DIAGNOSIS — R2689 Other abnormalities of gait and mobility: Secondary | ICD-10-CM

## 2018-04-26 NOTE — Therapy (Signed)
[facility] [address]  [place], [place], [address] Phone: [phone]   Fax:  [phone]  Physical Therapy Treatment  Patient Details  Name: Samantha Holden MRN: [id number] Date of Birth: 02/15/1971 Referring Provider: Karlton Lemon, MD   Encounter Date: 04/26/2018  PT End of Session - 04/26/18 1626    Visit Number  5    Number of Visits  12    Date for PT Re-Evaluation  05/21/18    PT Start Time  9798    PT Stop Time  1658    PT Time Calculation (min)  42 min    Activity Tolerance  Patient tolerated treatment well    Behavior During Therapy  [facility] for tasks assessed/performed       No past medical history on file.  No past surgical history on file.  There were no vitals filed for this visit.  Subjective Assessment - 04/26/18 1624    Subjective  Feels difficulty with heel raise HEP at home and attributes toe pain to this however toe pain has resolved.      Currently in Pain?  No/denies    Pain Score  0-No pain    Multiple Pain Sites  No                       [facility] Adult PT Treatment/Exercise - 04/26/18 1628      Neuro Re-ed    Neuro Re-ed Details   Standing on airex pad head turns verticla, horizontal, diagonal head turns with 2 ski pole support and eyes closed 2 x 20 sec each way       Modalities   Modalities  Iontophoresis      Iontophoresis   Type of Iontophoresis  Dexamethasone   Patch 1#/6    Location  to R medial ankle     Dose  1.58mL, 88mA-min    Time  4-6 hours wear time       Ankle Exercises: Supine   Other Supine Ankle Exercises  Supine alternating hip flexion march + DF with yellow band at forefoot x 15 reps       Ankle Exercises: Aerobic   Recumbent Bike  L3 x 6 min      Ankle Exercises: Stretches   Soleus Stretch  3 reps;30 seconds;Limitations    Soleus Stretch Limitations  leaning into wall     Other Stretch  R Pos Tibialis "Runner's stretch" stretch with  towel under lateral foot 2 x 30 sec       Ankle Exercises: Seated   Other Seated Ankle Exercises  R ankle EV/IV AROM x 15 reps              PT Education - 04/26/18 1728    Education Details  Provided handout for ionto including precautions, contraindications, and proper wear time with pt. verbalizing understanding    Person(s) Educated  Patient    Methods  Explanation;Verbal cues;Handout    Comprehension  Verbalized understanding;Verbal cues required;Need further instruction       PT Short Term Goals - 04/26/18 1729      PT SHORT TERM GOAL #1   Title  Pt will be independent with initial HEP    Status  Achieved        PT Long Term Goals - 04/16/18 1547      PT LONG TERM GOAL #1   Title  Pt will be independent with advanced HEP  Status  On-going      PT LONG TERM GOAL #2   Title  Pt will report improved ability to perform yard work without increased pain    Status  On-going      PT LONG TERM GOAL #3   Title  Pt will have R ankle AROM WNL in all directions to improve functional mobility and gait abnormalities     Status  On-going      PT LONG TERM GOAL #4   Title  Pt will have global R ankle strength 4+/5 to improve functional mobility and tolerance for weightbearing activities     Status  On-going      PT LONG TERM GOAL #5   Title  Pt will demonstrate normal gait with equal WB and no increased pain to improve tolerance to functional mobility    Status  On-going      PT LONG TERM GOAL #6   Title  Pt will report abilty to stand for 2 hours or greater to improve tolerance to work-related tasks and ability to shop to [place] - 04/26/18 [place] doing well today however notes some difficulty/pain with heel raise activity at home.  Pt. instructed to increase UE support to lessen intensity of this activity for improved tolerance with pt. verbalizing understanding.  Pt. tolerated all  activities in session well today with addition of compliant surface training for improvement in proprioception and stability at R ankle.  Pt. ended session pain free and opting to try trial of iontophoresis on tender R medial ankle vs taping.  Pt. verbalized understanding of proper wear-time and precautions and MD signed order for ionto thus patch 1#/6 applied today.  Will monitor response in coming visits.  Pt. noting 65% improvement in overall pain levels since starting therapy and progressing well.      PT Treatment/Interventions  ADLs/Self Care Home Management;Cryotherapy;Electrical Stimulation;Iontophoresis 4mg /ml Dexamethasone;Moist Heat;Ultrasound;DME Instruction;Gait training;Stair training;Therapeutic activities;Therapeutic exercise;Balance training;Neuromuscular re-education;Patient/family education;Manual techniques;Passive range of motion;Dry needling;Vasopneumatic Device;Taping    PT Next Visit Plan  response to ionto    Consulted and Agree with Plan of Care  Patient       Patient will benefit from skilled therapeutic intervention in order to improve the following deficits and impairments:  Abnormal gait, Decreased activity tolerance, Decreased strength, Pain, Decreased balance, Decreased mobility, Difficulty walking, Increased muscle spasms, Decreased range of motion  Visit Diagnosis: Pain in right ankle and joints of right foot  Other abnormalities of gait and mobility  Muscle weakness (generalized)     Problem List Patient Active Problem List   Diagnosis Date Noted  . Right ankle pain 02/16/2018    Bess Harvest, PTA 04/26/18 6:21 PM   [facility] [address]  [place], [place], [address] Phone: [phone]   Fax:  [phone]  Name: Samantha Holden MRN: [id number] Date of Birth: 06-19-1971

## 2018-04-26 NOTE — Patient Instructions (Signed)

## 2018-04-30 ENCOUNTER — Ambulatory Visit: Payer: No Typology Code available for payment source

## 2018-04-30 DIAGNOSIS — M25571 Pain in right ankle and joints of right foot: Secondary | ICD-10-CM | POA: Diagnosis not present

## 2018-04-30 DIAGNOSIS — M6281 Muscle weakness (generalized): Secondary | ICD-10-CM

## 2018-04-30 DIAGNOSIS — R2689 Other abnormalities of gait and mobility: Secondary | ICD-10-CM

## 2018-04-30 NOTE — Therapy (Signed)
[facility] [address]  [place], [place], [address] Phone: [phone]   Fax:  [phone]  Physical Therapy Treatment  Patient Details  Name: Samantha Holden MRN: [id number] Date of Birth: 08/27/1971 Referring Provider: Karlton Lemon, MD   Encounter Date: 04/30/2018  PT End of Session - 04/30/18 1615    Visit Number  6    Number of Visits  12    Date for PT Re-Evaluation  05/21/18    PT Start Time  1616    PT Stop Time  1701    PT Time Calculation (min)  45 min    Activity Tolerance  Patient tolerated treatment well    Behavior During Therapy  [facility] for tasks assessed/performed       No past medical history on file.  No past surgical history on file.  There were no vitals filed for this visit.  Subjective Assessment - 04/30/18 1627    Subjective  Pt. noting she is able to stand for 1 hour with good tolerance.      Currently in Pain?  No/denies    Pain Score  0-No pain   Pain rising to 8/10 in mornings until she "warms up".   Multiple Pain Sites  No                       [facility] Adult PT Treatment/Exercise - 04/30/18 1631      Iontophoresis   Type of Iontophoresis  Dexamethasone   ionto patch #2/6   Location  to R medial ankle     Dose  1.51mL, 33mA-min    Time  4-6 hours wear time       Manual Therapy   Manual Therapy  Soft tissue mobilization    Soft tissue mobilization  STM to posterior tib - ttp at medial mal and prox to medial mal      Ankle Exercises: Seated   BAPS  Level 3;Sitting;15 reps    BAPS Limitations  DF/PF, CW, CCW x 15 reps   2.5# at AL pole      Ankle Exercises: Aerobic   Recumbent Bike  L3 x 7 min      Ankle Exercises: Machines for Strengthening   Cybex Leg Press  B seated calf raise 30# x 20 reps    well tolerated      Ankle Exercises: Stretches   Gastroc Stretch  2 reps;30 seconds;Limitations    Gastroc Stretch Limitations  seated x 1 rep      Ankle  Exercises: Standing   Heel Raises  20 reps;3 seconds    Heel Raises Limitations  with ball squeeze between medial ankles     Other Standing Ankle Exercises  Alternating toe-touch to 9" step on airex pad x 10 reps each with 2 ski pole support               PT Short Term Goals - 04/26/18 1729      PT SHORT TERM GOAL #1   Title  Pt will be independent with initial HEP    Status  Achieved        PT Long Term Goals - 04/16/18 1547      PT LONG TERM GOAL #1   Title  Pt will be independent with advanced HEP    Status  On-going      PT LONG TERM GOAL #2   Title  Pt will report improved ability  to perform yard work without increased pain    Status  On-going      PT LONG TERM GOAL #3   Title  Pt will have R ankle AROM WNL in all directions to improve functional mobility and gait abnormalities     Status  On-going      PT LONG TERM GOAL #4   Title  Pt will have global R ankle strength 4+/5 to improve functional mobility and tolerance for weightbearing activities     Status  On-going      PT LONG TERM GOAL #5   Title  Pt will demonstrate normal gait with equal WB and no increased pain to improve tolerance to functional mobility    Status  On-going      PT LONG TERM GOAL #6   Title  Pt will report abilty to stand for 2 hours or greater to improve tolerance to work-related tasks and ability to shop to [place] - 04/30/18 [place] noting some benefit from iontophoresis patch applied last visit.  Feels she is still improving with therapy.  Tolerated progression of BAPS board and compliant surface standing balance activities well today.  Notes she was able to walk in yard without pain today for first time.  Progressing well toward goals.      PT Treatment/Interventions  ADLs/Self Care Home Management;Cryotherapy;Electrical Stimulation;Iontophoresis 4mg /ml Dexamethasone;Moist Heat;Ultrasound;DME  Instruction;Gait training;Stair training;Therapeutic activities;Therapeutic exercise;Balance training;Neuromuscular re-education;Patient/family education;Manual techniques;Passive range of motion;Dry needling;Vasopneumatic Device;Taping    Consulted and Agree with Plan of Care  Patient       Patient will benefit from skilled therapeutic intervention in order to improve the following deficits and impairments:  Abnormal gait, Decreased activity tolerance, Decreased strength, Pain, Decreased balance, Decreased mobility, Difficulty walking, Increased muscle spasms, Decreased range of motion  Visit Diagnosis: Pain in right ankle and joints of right foot  Other abnormalities of gait and mobility  Muscle weakness (generalized)     Problem List Patient Active Problem List   Diagnosis Date Noted  . Right ankle pain 02/16/2018    Bess Harvest, PTA 04/30/18 6:10 PM   [facility] [address]  [place], [place], [address] Phone: [phone]   Fax:  [phone]  Name: MORGHAN KESTER MRN: [id number] Date of Birth: 21-Sep-1970

## 2018-05-03 ENCOUNTER — Encounter: Payer: Self-pay | Admitting: Physical Therapy

## 2018-05-03 ENCOUNTER — Ambulatory Visit: Payer: No Typology Code available for payment source | Admitting: Physical Therapy

## 2018-05-03 DIAGNOSIS — M25571 Pain in right ankle and joints of right foot: Secondary | ICD-10-CM | POA: Diagnosis not present

## 2018-05-03 DIAGNOSIS — R2689 Other abnormalities of gait and mobility: Secondary | ICD-10-CM

## 2018-05-03 DIAGNOSIS — M6281 Muscle weakness (generalized): Secondary | ICD-10-CM

## 2018-05-03 NOTE — Therapy (Signed)
[facility] [address]  [place], [place], [address] Phone: [phone]   Fax:  [phone]  Physical Therapy Treatment  Patient Details  Name: Samantha Holden MRN: [id number] Date of Birth: Nov 13, 1970 Referring Provider: Karlton Lemon, MD   Encounter Date: 05/03/2018  PT End of Session - 05/03/18 1615    Visit Number  7    Number of Visits  12    Date for PT Re-Evaluation  05/21/18    Authorization Type  [place] - Number of Visits  60    PT Start Time  3235    PT Stop Time  1713    PT Time Calculation (min)  58 min    Activity Tolerance  Patient tolerated treatment well    Behavior During Therapy  [facility] for tasks assessed/performed       History reviewed. No pertinent past medical history.  History reviewed. No pertinent surgical history.  There were no vitals filed for this visit.  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 Not noting much benefit from ionto patches.    Currently in Pain?  Yes    Pain Score  5    while walking   Pain Location  Ankle   & foot   Pain Orientation  Right    Pain Descriptors / Indicators  --   "bone pain"   Pain Type  Acute pain    Pain Frequency  Intermittent    Aggravating Factors   standing & walking    Pain Relieving Factors  taping, rest, ice & elevation                       [facility] Adult PT Treatment/Exercise - 05/03/18 1615      Exercises   Exercises  Ankle      Modalities   Modalities  Vasopneumatic      Vasopneumatic   Number Minutes Vasopneumatic   10 minutes    Vasopnuematic Location   Ankle    Vasopneumatic Pressure  High    Vasopneumatic Temperature   lowest      Manual Therapy   Manual Therapy  Soft tissue  mobilization;Taping    Soft tissue mobilization  STM to Rt posterior tib, flex hallicus longus, flexor digitorum longus.       Kinesiotix   Facilitate Muscle   Regular rock tape applied with 25% stretch - abductor hallicus, plantar fascia & posterior tib (arch of foot to mid medial shin; perpendicular strip with 50% stretch applied to painful area on post tib tendon (medial ankle, just above malleoli)      Ankle Exercises: Aerobic   Recumbent Bike  L3 x 6 min      Ankle Exercises: Standing   Vector Stance  Right;5 reps    Vector Stance Limitations  L toe tap to 3 colored disc, 1 pole A    Rebounder  Lateral, heel-toe & B staggered stance weight shift x15, Marching in place x15 - B HHA    Heel Raises  Both;20 reps;3 seconds    Heel Raises Limitations  B concentric + primary weight shift (~75%) to R LE for eccentric lowering  Ankle Exercises: Seated   Other Seated Ankle Exercises  R ankle PF - red TB x10, green TB x10    Other Seated Ankle Exercises  R ankle DF, IV, EV with yellow TB x15               PT Short Term Goals - 04/26/18 1729      PT SHORT TERM GOAL #1   Title  Pt will be independent with initial HEP    Status  Achieved        PT Long Term Goals - 04/16/18 1547      PT LONG TERM GOAL #1   Title  Pt will be independent with advanced HEP    Status  On-going      PT LONG TERM GOAL #2   Title  Pt will report improved ability to perform yard work without increased pain    Status  On-going      PT LONG TERM GOAL #3   Title  Pt will have R ankle AROM WNL in all directions to improve functional mobility and gait abnormalities     Status  On-going      PT LONG TERM GOAL #4   Title  Pt will have global R ankle strength 4+/5 to improve functional mobility and tolerance for weightbearing activities     Status  On-going      PT LONG TERM GOAL #5   Title  Pt will demonstrate normal gait with equal WB and no increased pain to improve tolerance to functional  mobility    Status  On-going      PT LONG TERM GOAL #6   Title  Pt will report abilty to stand for 2 hours or greater to improve tolerance to work-related tasks and ability to shop to [place] - 05/03/18 [place] noting overall improvements in R foot and ankle pain but still with limited standing tolerance and prone to flare-ups of pain & swelling. More benefit noted from taping than ionto patch, therefore tape applied prior to exercises. Attempted to progress strengthening and proprioceptive training, but with questionable tolerance - pt grimacing or avoiding weight shift to R, but denying increased pain with most exercises except fwd reach with vector SLS. Treatment concluded with vasopneumatic compression to minimize post-exercises pain and swelling.       Patient will benefit from skilled therapeutic intervention in order to improve the following deficits and impairments:  Abnormal gait, Decreased activity tolerance, Decreased strength, Pain, Decreased balance, Decreased mobility, Difficulty walking, Increased muscle spasms, Decreased range of motion  Visit Diagnosis: Pain in right ankle and joints of right foot  Other abnormalities of gait and mobility  Muscle weakness (generalized)     Problem List Patient Active Problem List   Diagnosis Date Noted  . Right ankle pain 02/16/2018    Percival Spanish, PT, MPT 05/03/2018, 6:25 PM  [facility] [address]  [place], [place], [address] Phone: [phone]   Fax:  [phone]  Name: VENNIE SALSBURY MRN: [id number] Date of Birth: 03-Sep-1971

## 2018-05-07 ENCOUNTER — Ambulatory Visit: Payer: No Typology Code available for payment source | Admitting: Physical Therapy

## 2018-05-07 ENCOUNTER — Encounter: Payer: Self-pay | Admitting: Physical Therapy

## 2018-05-07 DIAGNOSIS — M25571 Pain in right ankle and joints of right foot: Secondary | ICD-10-CM | POA: Diagnosis not present

## 2018-05-07 DIAGNOSIS — R2689 Other abnormalities of gait and mobility: Secondary | ICD-10-CM

## 2018-05-07 DIAGNOSIS — M6281 Muscle weakness (generalized): Secondary | ICD-10-CM

## 2018-05-07 NOTE — Therapy (Signed)
[facility] [address]  [place], [place], [address] Phone: [phone]   Fax:  [phone]  Physical Therapy Treatment  Patient Details  Name: Samantha Holden MRN: [id number] Date of Birth: [birth year] Referring Provider: Karlton Lemon, MD   Encounter Date: 05/07/2018  PT End of Session - 05/07/18 1702    Visit Number  8    Number of Visits  12    Date for PT Re-Evaluation  05/21/18    Authorization Type  [employer] - Number of Visits  60    PT Start Time  1702    PT Stop Time  1754    PT Time Calculation (min)  52 min    Activity Tolerance  Patient tolerated treatment well    Behavior During Therapy  [facility] for tasks assessed/performed       History reviewed. No pertinent past medical history.  History reviewed. No pertinent surgical history.  There were no vitals filed for this visit.  Subjective Assessment - 05/07/18 1705    Subjective  Rock tape still mostly intact from last visit. States if she stands too long her feet numb.    Limitations  Standing;Walking;House hold activities    How long can you stand comfortably?  30-45 minutes    How long can you walk comfortably?  30-35 minutes with shopping cart    Currently in Pain?  Yes    Pain Score  3     Pain Location  Ankle   & foot   Pain Orientation  Right    Pain Descriptors / Indicators  Aching    Pain Onset  More than a month ago    Pain Frequency  Constant    Aggravating Factors   standing & walking         [facility] PT Assessment - 05/07/18 1702      Assessment   Medical Diagnosis  Right Ankle Pain    Referring Provider  Karlton Lemon, MD    Onset Date/Surgical Date  02/10/18    Next MD Visit  05/10/2018      AROM   Right Ankle Dorsiflexion  10    Right Ankle Plantar Flexion  59    Right Ankle Inversion  20    Right Ankle Eversion  14      Strength   Right Ankle Dorsiflexion  4+/5    Right Ankle Plantar Flexion  2/5    Right Ankle Inversion  2-/5    Right Ankle Eversion  4+/5                   [facility] Adult PT Treatment/Exercise - 05/07/18 1702      Exercises   Exercises  Ankle      Modalities   Modalities  Vasopneumatic      Vasopneumatic   Number Minutes Vasopneumatic   10 minutes    Vasopnuematic Location   Ankle    Vasopneumatic Pressure  High    Vasopneumatic Temperature   lowest      Manual Therapy   Manual Therapy  Soft tissue mobilization    Soft tissue mobilization  STM to Rt posterior tib, flex hallicus longus, flexor digitorum longus.; Gentle CFM to extensor tendons at areas of tenderness      Ankle Exercises: Aerobic   Recumbent Bike  L3 x 6 min      Ankle Exercises: Seated   Other Seated Ankle Exercises  Rt inversion isometrics 5 x 10 sec    Other Seated Ankle Exercises  R ankle IV with yellow TB x15      Ankle Exercises: Sidelying   Ankle Inversion  10 reps;AAROM   4 sest   Ankle Inversion Limitations  2 sets with PT assist for concentric motion, focusing on slow eccentric control; last 2 sets with strap loopwd over seat of chair for pt to provide self-AAROM for concentric motion & releasing tension on strap for active eccentric motion               PT Short Term Goals - 04/26/18 1729      PT SHORT TERM GOAL #1   Title  Pt will be independent with initial HEP    Status  Achieved        PT Long Term Goals - 05/07/18 1740      PT LONG TERM GOAL #1   Title  Pt will be independent with advanced HEP    Status  Partially Met      PT LONG TERM GOAL #2   Title  Pt will report improved ability to perform yard work without increased pain    Status  On-going      PT LONG TERM GOAL #3   Title  Pt will have R ankle AROM WNL in all directions to improve functional mobility and gait abnormalities     Status  Partially Met      PT LONG TERM GOAL #4   Title  Pt will have global R ankle strength 4+/5 to improve functional mobility and tolerance for  weightbearing activities     Status  Partially Met      PT LONG TERM GOAL #5   Title  Pt will demonstrate normal gait with equal WB and no increased pain to improve tolerance to functional mobility    Status  On-going      PT LONG TERM GOAL #6   Title  Pt will report abilty to stand for 2 hours or greater to improve tolerance to work-related tasks and ability to shop to [place] - 05/07/18 [place] demonstrating progress with PT with decreasing R foot and ankle pain and improving R ankle ROM. R ankle PF and inversion strength progressing more slowly with limited control against gravity. Pt reporting increased standing and walking tolerance but still finds it easy to do too much and flare-up the foot and ankle. Aleighya also noting some soreness over dorsum of foot which may be indicative of extensor tendonitis and some numbness in B feet with prolonged standing which may be indicative of tarsal tunnel syndrome. Some of LTGs now partially met, but anticipate pt will require recert at end of current POC to continue to address strengthening, proprioception and balance for increased activity and walkin tolerance.    Rehab Potential  Good    PT Treatment/Interventions  ADLs/Self Care Home Management;Cryotherapy;Electrical Stimulation;Iontophoresis 74m/ml Dexamethasone;Moist Heat;Ultrasound;DME Instruction;Gait training;Stair training;Therapeutic activities;Therapeutic exercise;Balance training;Neuromuscular re-education;Patient/family education;Manual techniques;Passive range of motion;Dry needling;Vasopneumatic Device;Taping    Consulted and Agree with Plan of Care  Patient       Patient will benefit from skilled therapeutic intervention in order to improve the following deficits and impairments:  Abnormal gait, Decreased activity tolerance, Decreased strength, Pain, Decreased balance, Decreased mobility, Difficulty walking,  Increased muscle spasms, Decreased range of motion  Visit Diagnosis: Pain in right ankle and joints of right foot  Other abnormalities of gait and mobility  Muscle weakness (generalized)     Problem List Patient Active Problem List   Diagnosis Date Noted  . Right ankle pain 02/16/2018    Percival Spanish, PT, MPT 05/07/2018, 6:22 PM  [facility] [address]  [place], [place], [address] Phone: [phone]   Fax:  [phone]  Name: SHERENA MACHORRO MRN: [id number] Date of Birth: 01/24/1971

## 2018-05-10 ENCOUNTER — Ambulatory Visit (INDEPENDENT_AMBULATORY_CARE_PROVIDER_SITE_OTHER): Payer: No Typology Code available for payment source | Admitting: Family Medicine

## 2018-05-10 ENCOUNTER — Ambulatory Visit: Payer: No Typology Code available for payment source | Admitting: Physical Therapy

## 2018-05-10 ENCOUNTER — Encounter: Payer: Self-pay | Admitting: Family Medicine

## 2018-05-10 VITALS — BP 123/85 | HR 78 | Ht 72.0 in | Wt 300.0 lb

## 2018-05-10 DIAGNOSIS — M659 Synovitis and tenosynovitis, unspecified: Secondary | ICD-10-CM

## 2018-05-10 DIAGNOSIS — M25571 Pain in right ankle and joints of right foot: Secondary | ICD-10-CM | POA: Diagnosis not present

## 2018-05-10 DIAGNOSIS — M6281 Muscle weakness (generalized): Secondary | ICD-10-CM

## 2018-05-10 DIAGNOSIS — R2689 Other abnormalities of gait and mobility: Secondary | ICD-10-CM

## 2018-05-10 MED ORDER — METHYLPREDNISOLONE ACETATE 40 MG/ML IJ SUSP
40.0000 mg | Freq: Once | INTRAMUSCULAR | Status: AC
  Administered 2018-05-10: 40 mg via INTRA_ARTICULAR

## 2018-05-10 NOTE — Patient Instructions (Signed)
You were given a tendon sheath injection today. Continue with arch supports. Continue physical therapy and do home exercises on days you don't go to therapy. Icing 15 minutes at a time 3-4 times a day. Follow up with me in 5-6 weeks for reevaluation.

## 2018-05-10 NOTE — Progress Notes (Signed)
PCP: Patient, No Pcp Per  Subjective:   HPI: Patient is a 47 y.o. female here for right ankle pain.  6/5: Patient denies known injury or trauma. She states about 1 week ago she has had pain in right ankle. Associated swelling around the ankle. Pain is 5-6/10 and sharp with a burning sensation, feeling of pins sticking in the ankle. She had problems with this ankle around 2000 and had injection. No change in activity level prior to this. Tried tylenol, ibuprofen, crutches. Pain mainly medial. No skin changes, numbness.  7/18: Patient reports her right ankle is a little better but still with 4/10 level of pain, sharp and medial. Having to use crutches still. Taking aleve, icing, ankle sleeve, doing home exercises but these are painful. No skin changes. No new injuries.  8/29: Patient returns for follow-up for right ankle pain.  She reports swelling of the right ankle and continued medial pain with prolonged standing.  She has been wearing a nitroglycerin patch about every other day.  She does physical therapy twice weekly.  She obtained Dr. Felicie Morn active series insoles.  Combined all these interventions are somewhat helpful but she continues to have discomfort.  She also reports occasional numbness across the bottom of her foot with prolonged standing.  Overall her symptoms have been ongoing since June of this year.  No past medical history on file.  Current Outpatient Medications on File Prior to Visit  Medication Sig Dispense Refill  . methocarbamol (ROBAXIN) 500 MG tablet Take 1 tablet (500 mg total) by mouth 2 (two) times daily. (Patient not taking: Reported on 04/09/2018) 20 tablet 0  . nitroGLYCERIN (NITRODUR - DOSED IN MG/24 HR) 0.2 mg/hr patch Apply 1/4th patch to affected ankle, change daily 30 patch 1   No current facility-administered medications on file prior to visit.     No past surgical history on file.  No Known Allergies  Social History   Socioeconomic  History  . Marital status: Married    Spouse name: Not on file  . Number of children: Not on file  . Years of education: Not on file  . Highest education level: Not on file  Occupational History  . Not on file  Social Needs  . Financial resource strain: Not on file  . Food insecurity:    Worry: Not on file    Inability: Not on file  . Transportation needs:    Medical: Not on file    Non-medical: Not on file  Tobacco Use  . Smoking status: Never Smoker  . Smokeless tobacco: Never Used  Substance and Sexual Activity  . Alcohol use: No  . Drug use: Not on file  . Sexual activity: Not on file  Lifestyle  . Physical activity:    Days per week: Not on file    Minutes per session: Not on file  . Stress: Not on file  Relationships  . Social connections:    Talks on phone: Not on file    Gets together: Not on file    Attends religious service: Not on file    Active member of club or organization: Not on file    Attends meetings of clubs or organizations: Not on file    Relationship status: Not on file  . Intimate partner violence:    Fear of current or ex partner: Not on file    Emotionally abused: Not on file    Physically abused: Not on file    Forced sexual activity: Not  on file  Other Topics Concern  . Not on file  Social History Narrative  . Not on file    No family history on file.  BP 123/85   Pulse 78   Ht 6' (1.829 m)   Wt 300 lb (136.1 kg)   BMI 40.69 kg/m   Review of Systems: See HPI above.     Objective:  Physical Exam:  GEN: aaox4, nad, pt comfortable  Right Ankle: - Inspection: No obvious deformity. No erythema or bruising. Circumferential swelling/edema - Palpation: TTP over the post tib tendon. No bony tenderness - Strength: Normal strength with dorsiflexion, plantarflexion, eversion. 4/5 strength with inversion and associated pain. - ROM: decreased inversion and dorsiflexion compared to left ankle - Neuro/vasc: NV intact - Special Tests:  Negative anterior drawer - [place]: limited US of the ankle reveals soft tissue swelling diffusely around the ankle. Evidence of tenosynovitis of the PT tendon   Assessment & Plan:  1. Right ankle pain - 2/2 posterior tibialis tenosynovitis - steroid injection performed today as detailed below -Continue physical therapy - Continue to wear arch supports.  Discussed possibility of custom orthotics if she desires - Continue to use ice and elevate foot -Follow-up in 6 weeks   Procedure performed: Right tibialis posterior tenosynovitis corticosteroid injection; ultrasound guided  Consent obtained and verified. Time-out conducted. Noted no overlying erythema, induration, or other signs of local infection. The right posterior tibialis tendon was identified on ultrasound. The overlying skin was prepped in a sterile fashion. Topical analgesic spray: Ethyl chloride. Needle: 25-gauge 1.5 inch Under ultrasound guidance, the needle was inserted out of plane into the tendon sheath and injection performed.  Completed without difficulty. Meds: 40 mg Depo-Medrol, 1 cc bupivacaine  Advised to call if fevers/chills, erythema, induration, drainage, or persistent bleeding.

## 2018-05-10 NOTE — Therapy (Addendum)
[facility] [address]  [place], [place], [address] Phone: [phone]   Fax:  [phone]  Physical Therapy Treatment  Patient Details  Name: Samantha Holden MRN: [id number] Date of Birth: April 02, 1971 Referring Provider: Karlton Lemon, MD   Encounter Date: 05/10/2018  PT End of Session - 05/10/18 1540    Visit Number  9    Number of Visits  12    Date for PT Re-Evaluation  05/21/18    Authorization Type  [employer]    Authorization - Number of Visits  30    PT Start Time  1540   Pt coming from appt with Dr. Barbaraann Barthel   PT Stop Time  1640    PT Time Calculation (min)  60 min    Activity Tolerance  Patient tolerated treatment well    Behavior During Therapy  [facility] for tasks assessed/performed       No past medical history on file.  No past surgical history on file.  There were no vitals filed for this visit.  Subjective Assessment - 05/10/18 1543    Subjective  Pt reporting increased swelling today, but not really painful. Just came from Dr. Ericka Pontiff office where she received a cortisone injection and he told to continue with icing and elevation as well as PT.    Currently in Pain?  Yes    Pain Score  2     Pain Location  Ankle    Pain Orientation  Right    Pain Descriptors / Indicators  Aching   mild   Pain Type  Acute pain         [facility] PT Assessment - 05/10/18 1540      Assessment   Next MD Visit  06/19/2018                   [facility] Adult PT Treatment/Exercise - 05/10/18 1540      Exercises   Exercises  Ankle      Modalities   Modalities  Vasopneumatic      Vasopneumatic   Number Minutes Vasopneumatic   10 minutes    Vasopnuematic Location   Ankle    Vasopneumatic Pressure  High    Vasopneumatic Temperature   lowest      Ankle Exercises: Aerobic   Recumbent Bike  L3 x 6 min      Ankle Exercises: Machines for Strengthening   Cybex Leg Press  B ankle press 35# x15; R ankle  press 15# x10 (increased discomfort noted with single leg press)      Ankle Exercises: Standing   Rocker Board  3 minutes    Rocker Board Limitations  static balance 2x30", lateral rock x10, heel-toe rock x 10    Other Standing Ankle Exercises  Marching in place on Airex pad x20 - B HHA      Ankle Exercises: Seated   BAPS  Sitting;Level 3;15 reps    BAPS Weights (lbs)  2.5# at PM (DF/PF maintaining level board); 5# at P (DF/PF)    Other Seated Ankle Exercises  R Fitter ankle press (1 black, 1 blue) x15               PT Short Term Goals - 04/26/18 1729      PT SHORT TERM GOAL #1   Title  Pt will be independent with initial HEP    Status  Achieved        PT Long  Term Goals - 05/07/18 1740      PT LONG TERM GOAL #1   Title  Pt will be independent with advanced HEP    Status  Partially Met      PT LONG TERM GOAL #2   Title  Pt will report improved ability to perform yard work without increased pain    Status  On-going      PT LONG TERM GOAL #3   Title  Pt will have R ankle AROM WNL in all directions to improve functional mobility and gait abnormalities     Status  Partially Met      PT LONG TERM GOAL #4   Title  Pt will have global R ankle strength 4+/5 to improve functional mobility and tolerance for weightbearing activities     Status  Partially Met      PT LONG TERM GOAL #5   Title  Pt will demonstrate normal gait with equal WB and no increased pain to improve tolerance to functional mobility    Status  On-going      PT LONG TERM GOAL #6   Title  Pt will report abilty to stand for 2 hours or greater to improve tolerance to work-related tasks and ability to shop to PLOF    Status  On-going            Plan - 05/10/18 [place] received steriod injection today and states MD wants her to continue with PT. Increased swelling noted today but no increase in pain reported and able to tolerate majority of exercises and  proprioceptive activities w/o increase in pain, but fatigue noted. Treatment concluded with vasopneumatic compression to promote edema reduction.    Rehab Potential  Good    PT Treatment/Interventions  ADLs/Self Care Home Management;Cryotherapy;Electrical Stimulation;Iontophoresis '4mg'$ /ml Dexamethasone;Moist Heat;Ultrasound;DME Instruction;Gait training;Stair training;Therapeutic activities;Therapeutic exercise;Balance training;Neuromuscular re-education;Patient/family education;Manual techniques;Passive range of motion;Dry needling;Vasopneumatic Device;Taping    Consulted and Agree with Plan of Care  Patient       Patient will benefit from skilled therapeutic intervention in order to improve the following deficits and impairments:  Abnormal gait, Decreased activity tolerance, Decreased strength, Pain, Decreased balance, Decreased mobility, Difficulty walking, Increased muscle spasms, Decreased range of motion  Visit Diagnosis: Pain in right ankle and joints of right foot  Other abnormalities of gait and mobility  Muscle weakness (generalized)     Problem List Patient Active Problem List   Diagnosis Date Noted  . Right ankle pain 02/16/2018    Percival Spanish, PT, MPT 05/10/2018, 6:04 PM  [facility] [address]  [place], [place], [address] Phone: [phone]   Fax:  [phone]  Name: Samantha Holden MRN: [id number] Date of Birth: [birth year]  PHYSICAL THERAPY DISCHARGE SUMMARY  Visits from Start of Care: 9  Current functional level related to goals / functional outcomes:   Unable to assess as failed to return for any further visits after 05/10/18. Refer to above note for status as of last visit.   Remaining deficits:   As above.   Education / Equipment:   HEP  Plan: Patient agrees to discharge.  Patient goals were partially met. Patient is being discharged due to not returning since the last visit.  ?????      Percival Spanish, PT, MPT 07/17/18, 9:57 AM  [facility] [address]  [address] [place], [place],  [place] Phone: [phone]   Fax:  [phone]

## 2018-05-11 ENCOUNTER — Encounter: Payer: Self-pay | Admitting: Family Medicine

## 2018-06-19 ENCOUNTER — Ambulatory Visit: Payer: No Typology Code available for payment source | Admitting: Family Medicine

## 2018-07-15 NOTE — Progress Notes (Addendum)
[place] at [facility] [address] [place], [place] [address] [phone]  Date:  07/18/2018   Name:  Samantha Holden   DOB:  [birth year]   MRN:  [id number]  PCP:  Darreld Mclean, MD    Chief Complaint: New Patient (Initial Visit) (never done a flu shot)   History of Present Illness:  Samantha Holden is a 47 y.o. very pleasant female patient who presents with the following:  Here today to establish care Married to my patient Erline Levine  Only acute visits visible in Epic  She is from [place] and moved here in 2006.  She is in [place] wiht the [employer] They have 3 children.   They are daughters 17, 64, and her oldest son is 57 and in college.   Never a smoker Recent ankle sprain- treated with nitro patches  She is wearing a CAM boot.  Getting better gradually  She has seen GYN but not in 18 months or so, her doctor moved locations  Her last pap was maybe 2017- never had an abnormal mammo is done at [facility]- she will do this soon   No recent labs- we will do labs for her today  Declines flu shot  Her last tetanus was over 10 years ago- would like to do today No family history of colon cancer   She has noted urinary urgency but no leakage for a few years, but getting worse the last couple of months No blood in urine No dysuria  Menses are still regular - LMP 07/01/18  States that she has generally been in good health In her free time she enjoys movies, reading    Patient Active Problem List   Diagnosis Date Noted  . Right ankle pain 02/16/2018    Past Medical History:  Diagnosis Date  . Chicken pox     Past Surgical History:  Procedure Laterality Date  . DILATION AND CURETTAGE, DIAGNOSTIC / THERAPEUTIC  1992  . WISDOM TOOTH EXTRACTION      Social History   Tobacco Use  . Smoking status: Never Smoker  . Smokeless tobacco: Never Used  Substance Use Topics  . Alcohol use: No  . Drug use: Never    Family History   Problem Relation Age of Onset  . Breast cancer Mother   . Arthritis Mother   . Hypertension Mother   . Cancer Father   . Bladder Cancer Father   . Heart attack Father   . Arthritis Maternal Grandmother   . Heart disease Maternal Grandfather   . Arthritis Maternal Grandfather   . Heart attack Maternal Grandfather   . Hypertension Maternal Grandfather     No Known Allergies  Medication list has been reviewed and updated.  Current Outpatient Medications on File Prior to Visit  Medication Sig Dispense Refill  . Ibuprofen-Famotidine (DUEXIS) 800-26.6 MG TABS Take by mouth 3 (three) times daily. As needed    . nitroGLYCERIN (NITRODUR - DOSED IN MG/24 HR) 0.2 mg/hr patch Apply 1/4th patch to affected ankle, change daily (Patient not taking: Reported on 07/18/2018) 30 patch 1   No current facility-administered medications on file prior to visit.     Review of Systems:  As per HPI- otherwise negative. No fever or chills No CP or SOB    Physical Examination: Vitals:   07/18/18 1334  BP: 112/82  Pulse: 67  Resp: 16  Temp: 98 F (36.7 C)  SpO2: 98%  Vitals:   07/18/18 1334  Weight: (!) 304 lb (137.9 kg)  Height: 6' (1.829 m)   Body mass index is 41.23 kg/m. Ideal Body Weight: Weight in (lb) to have BMI = 25: 183.9  GEN: WDWN, NAD, Non-toxic, A & O x 3, obese, looks well  HEENT: Atraumatic, Normocephalic. Neck supple. No masses, No LAD.  Bilateral TM wnl, oropharynx normal.  PEERL,EOMI.   Ears and Nose: No external deformity. CV: RRR, No M/G/R. No JVD. No thrill. No extra heart sounds. PULM: CTA B, no wheezes, crackles, rhonchi. No retractions. No resp. distress. No accessory muscle use. ABD: S, NT, ND. No rebound. No HSM. EXTR: No c/c/e NEURO wearing CAM on right foot  PSYCH: Normally interactive. Conversant. Not depressed or anxious appearing.  Calm demeanor.   Results for orders placed or performed in visit on 07/18/18  Urine Culture  Result Value Ref Range    MICRO NUMBER: [id number]    SPECIMEN QUALITY: ADEQUATE    Sample Source NOT GIVEN    STATUS: FINAL    Result:      Three or more organisms present, each greater than 10,000 cu/mL. May represent normal flora contamination from external genitalia. No further testing is required.  CBC  Result Value Ref Range   WBC 4.1 4.0 - 10.5 K/uL   RBC 4.59 3.87 - 5.11 Mil/uL   Platelets 286.0 150.0 - 400.0 K/uL   Hemoglobin 11.9 (L) 12.0 - 15.0 g/dL   HCT 37.6 36.0 - 46.0 %   MCV 81.8 78.0 - 100.0 fl   MCHC 31.8 30.0 - 36.0 g/dL   RDW 16.1 (H) 11.5 - 15.5 %  Comprehensive metabolic panel  Result Value Ref Range   Sodium 136 135 - 145 mEq/L   Potassium 3.8 3.5 - 5.1 mEq/L   Chloride 102 96 - 112 mEq/L   CO2 24 19 - 32 mEq/L   Glucose, Bld 98 70 - 99 mg/dL   BUN 12 6 - 23 mg/dL   Creatinine, Ser 0.85 0.40 - 1.20 mg/dL   Total Bilirubin 0.5 0.2 - 1.2 mg/dL   Alkaline Phosphatase 84 39 - 117 U/L   AST 14 0 - 37 U/L   ALT 10 0 - 35 U/L   Total Protein 7.5 6.0 - 8.3 g/dL   Albumin 3.9 3.5 - 5.2 g/dL   Calcium 9.2 8.4 - 10.5 mg/dL   GFR 91.56 >60.00 mL/min  Hemoglobin A1c  Result Value Ref Range   Hgb A1c MFr Bld 6.2 4.6 - 6.5 %  Lipid panel  Result Value Ref Range   Cholesterol 178 0 - 200 mg/dL   Triglycerides 101.0 0.0 - 149.0 mg/dL   HDL 55.60 >39.00 mg/dL   VLDL 20.2 0.0 - 40.0 mg/dL   LDL Cholesterol 102 (H) 0 - 99 mg/dL   Total CHOL/HDL Ratio 3    NonHDL 122.40   POCT urinalysis dipstick  Result Value Ref Range   Color, UA yellow yellow   Clarity, UA cloudy (A) clear   Glucose, UA negative negative mg/dL   Bilirubin, UA small (A) negative   Ketones, POC UA negative negative mg/dL   Spec Grav, UA >=1.030 (A) 1.010 - 1.025   Blood, UA negative negative   pH, UA 6.0 5.0 - 8.0   Protein Ur, POC trace (A) negative mg/dL   Urobilinogen, UA 0.2 0.2 or 1.0 E.U./dL   Nitrite, UA Negative Negative   Leukocytes, UA Negative Negative    Assessment and Plan: Immunization due -  Plan:  Tdap vaccine greater than or equal to 7yo IM  Screening for deficiency anemia - Plan: CBC  Screening for hyperlipidemia - Plan: Lipid panel  Screening for diabetes mellitus - Plan: Comprehensive metabolic panel, Hemoglobin A1c  Urinary urgency - Plan: POCT urinalysis dipstick, Urine Culture, mirabegron ER (MYRBETRIQ) 25 MG TB24 tablet  Establishing care today tdap given Labs pending as above Likely overactive bladder- will have her try Myrbetriq.  Culture pending but expect will be negative Will plan further follow- up pending labs.   Signed Lamar Blinks, MD  Received her labs so far - letter to pt  Will contact her about urine culture IS positive  Results for orders placed or performed in visit on 07/18/18  Urine Culture  Result Value Ref Range   MICRO NUMBER: [id number]    SPECIMEN QUALITY: ADEQUATE    Sample Source NOT GIVEN    STATUS: FINAL    Result:      Three or more organisms present, each greater than 10,000 cu/mL. May represent normal flora contamination from external genitalia. No further testing is required.  CBC  Result Value Ref Range   WBC 4.1 4.0 - 10.5 K/uL   RBC 4.59 3.87 - 5.11 Mil/uL   Platelets 286.0 150.0 - 400.0 K/uL   Hemoglobin 11.9 (L) 12.0 - 15.0 g/dL   HCT 37.6 36.0 - 46.0 %   MCV 81.8 78.0 - 100.0 fl   MCHC 31.8 30.0 - 36.0 g/dL   RDW 16.1 (H) 11.5 - 15.5 %  Comprehensive metabolic panel  Result Value Ref Range   Sodium 136 135 - 145 mEq/L   Potassium 3.8 3.5 - 5.1 mEq/L   Chloride 102 96 - 112 mEq/L   CO2 24 19 - 32 mEq/L   Glucose, Bld 98 70 - 99 mg/dL   BUN 12 6 - 23 mg/dL   Creatinine, Ser 0.85 0.40 - 1.20 mg/dL   Total Bilirubin 0.5 0.2 - 1.2 mg/dL   Alkaline Phosphatase 84 39 - 117 U/L   AST 14 0 - 37 U/L   ALT 10 0 - 35 U/L   Total Protein 7.5 6.0 - 8.3 g/dL   Albumin 3.9 3.5 - 5.2 g/dL   Calcium 9.2 8.4 - 10.5 mg/dL   GFR 91.56 >60.00 mL/min  Hemoglobin A1c  Result Value Ref Range   Hgb A1c MFr Bld 6.2 4.6 - 6.5 %   Lipid panel  Result Value Ref Range   Cholesterol 178 0 - 200 mg/dL   Triglycerides 101.0 0.0 - 149.0 mg/dL   HDL 55.60 >39.00 mg/dL   VLDL 20.2 0.0 - 40.0 mg/dL   LDL Cholesterol 102 (H) 0 - 99 mg/dL   Total CHOL/HDL Ratio 3    NonHDL 122.40   POCT urinalysis dipstick  Result Value Ref Range   Color, UA yellow yellow   Clarity, UA cloudy (A) clear   Glucose, UA negative negative mg/dL   Bilirubin, UA small (A) negative   Ketones, POC UA negative negative mg/dL   Spec Grav, UA >=1.030 (A) 1.010 - 1.025   Blood, UA negative negative   pH, UA 6.0 5.0 - 8.0   Protein Ur, POC trace (A) negative mg/dL   Urobilinogen, UA 0.2 0.2 or 1.0 E.U./dL   Nitrite, UA Negative Negative   Leukocytes, UA Negative Negative    Received urine culture 11/8- negative

## 2018-07-18 ENCOUNTER — Encounter: Payer: Self-pay | Admitting: Family Medicine

## 2018-07-18 ENCOUNTER — Ambulatory Visit: Payer: No Typology Code available for payment source | Admitting: Family Medicine

## 2018-07-18 VITALS — BP 112/82 | HR 67 | Temp 98.0°F | Resp 16 | Ht 72.0 in | Wt 304.0 lb

## 2018-07-18 DIAGNOSIS — Z130 Encounter for screening for diseases of the blood and blood-forming organs and certain disorders involving the immune mechanism: Secondary | ICD-10-CM

## 2018-07-18 DIAGNOSIS — Z13220 Encounter for screening for lipoid disorders: Secondary | ICD-10-CM | POA: Diagnosis not present

## 2018-07-18 DIAGNOSIS — R3915 Urgency of urination: Secondary | ICD-10-CM | POA: Diagnosis not present

## 2018-07-18 DIAGNOSIS — Z23 Encounter for immunization: Secondary | ICD-10-CM | POA: Diagnosis not present

## 2018-07-18 DIAGNOSIS — Z131 Encounter for screening for diabetes mellitus: Secondary | ICD-10-CM

## 2018-07-18 LAB — COMPREHENSIVE METABOLIC PANEL
ALK PHOS: 84 U/L (ref 39–117)
ALT: 10 U/L (ref 0–35)
AST: 14 U/L (ref 0–37)
Albumin: 3.9 g/dL (ref 3.5–5.2)
BUN: 12 mg/dL (ref 6–23)
CHLORIDE: 102 meq/L (ref 96–112)
CO2: 24 mEq/L (ref 19–32)
Calcium: 9.2 mg/dL (ref 8.4–10.5)
Creatinine, Ser: 0.85 mg/dL (ref 0.40–1.20)
GFR: 91.56 mL/min (ref >60.00)
GLUCOSE: 98 mg/dL (ref 70–99)
Potassium: 3.8 mEq/L (ref 3.5–5.1)
Sodium: 136 mEq/L (ref 135–145)
TOTAL PROTEIN: 7.5 g/dL (ref 6.0–8.3)
Total Bilirubin: 0.5 mg/dL (ref 0.2–1.2)

## 2018-07-18 LAB — POCT URINALYSIS DIP (MANUAL ENTRY)
BILIRUBIN UA: NEGATIVE mg/dL
Blood, UA: NEGATIVE
GLUCOSE UA: NEGATIVE mg/dL
LEUKOCYTES UA: NEGATIVE
NITRITE UA: NEGATIVE
Spec Grav, UA: >=1.03 — AB (ref 1.010–1.025)
Urobilinogen, UA: 0.2 E.U./dL
pH, UA: 6 (ref 5.0–8.0)

## 2018-07-18 LAB — LIPID PANEL
CHOL/HDL RATIO: 3
Cholesterol: 178 mg/dL (ref 0–200)
HDL: 55.6 mg/dL (ref >39.00)
LDL Cholesterol: 102 mg/dL — ABNORMAL HIGH (ref 0–99)
NonHDL: 122.4
TRIGLYCERIDES: 101 mg/dL (ref 0.0–149.0)
VLDL: 20.2 mg/dL (ref 0.0–40.0)

## 2018-07-18 LAB — CBC
HCT: 37.6 % (ref 36.0–46.0)
HEMOGLOBIN: 11.9 g/dL — AB (ref 12.0–15.0)
MCHC: 31.8 g/dL (ref 30.0–36.0)
MCV: 81.8 fl (ref 78.0–100.0)
PLATELETS: 286 10*3/uL (ref 150.0–400.0)
RBC: 4.59 Mil/uL (ref 3.87–5.11)
RDW: 16.1 % — ABNORMAL HIGH (ref 11.5–15.5)
WBC: 4.1 10*3/uL (ref 4.0–10.5)

## 2018-07-18 LAB — HEMOGLOBIN A1C: HEMOGLOBIN A1C: 6.2 % (ref 4.6–6.5)

## 2018-07-18 MED ORDER — MIRABEGRON ER 25 MG PO TB24: 25.0000 mg | ORAL_TABLET | Freq: Every day | ORAL | 11 refills | Status: DC

## 2018-07-18 NOTE — Patient Instructions (Signed)
I will be in touch with your labs asap Let's try Myrbetriq for your overactive bladder- take it once a day.  Let me know if not helpful or if too expensive

## 2018-07-20 LAB — URINE CULTURE
MICRO NUMBER:: 91336766
SPECIMEN QUALITY: ADEQUATE

## 2018-10-24 ENCOUNTER — Other Ambulatory Visit: Payer: Self-pay | Admitting: Podiatry

## 2018-10-24 ENCOUNTER — Ambulatory Visit: Payer: No Typology Code available for payment source | Admitting: Podiatry

## 2018-10-24 ENCOUNTER — Ambulatory Visit (INDEPENDENT_AMBULATORY_CARE_PROVIDER_SITE_OTHER): Payer: No Typology Code available for payment source

## 2018-10-24 ENCOUNTER — Encounter: Payer: Self-pay | Admitting: Podiatry

## 2018-10-24 VITALS — BP 105/74 | HR 87

## 2018-10-24 DIAGNOSIS — M76821 Posterior tibial tendinitis, right leg: Secondary | ICD-10-CM | POA: Diagnosis not present

## 2018-10-24 DIAGNOSIS — M2141 Flat foot [pes planus] (acquired), right foot: Secondary | ICD-10-CM

## 2018-10-24 DIAGNOSIS — M79671 Pain in right foot: Secondary | ICD-10-CM

## 2018-10-24 MED ORDER — METHYLPREDNISOLONE 4 MG PO TBPK: ORAL_TABLET | ORAL | 0 refills | Status: DC

## 2018-10-29 NOTE — Progress Notes (Signed)
   Subjective:  48 year old female presenting today as a new patient with a chief complaint of right foot pain that began about 8 months ago. She states the pain causes her to limp and is worsened by pressure. She was evaluated by an orthopedist 7 months ago and states nothing was done for treatment. She states she has done physical therapy and received an injection from Dr. Gershon Mussel 6 months ago with temporary relief. Patient is here for further evaluation and treatment.  Past Medical History:  Diagnosis Date  . Chicken pox        Objective/Physical Exam General: The patient is alert and oriented x3 in no acute distress.  Dermatology: Skin is warm, dry and supple bilateral lower extremities. Negative for open lesions or macerations.  Vascular: Palpable pedal pulses bilaterally. No edema or erythema noted. Capillary refill within normal limits.  Neurological: Epicritic and protective threshold grossly intact bilaterally.   Musculoskeletal Exam: Range of motion within normal limits to all pedal and ankle joints bilateral. Muscle strength 5/5 in all groups bilateral.  Upon weightbearing there is a medial longitudinal arch collapse bilaterally. Remove foot valgus noted to the bilateral lower extremities with excessive pronation upon mid stance. Pain on palpation noted to the posterior tibial tendon of the right foot with medial longitudinal arch collapse noted.  Radiographic Exam:  Normal osseous mineralization. Joint spaces preserved. No fracture/dislocation/boney destruction.   Pes planus noted on radiographic exam lateral views. Decreased calcaneal inclination and metatarsal declination angle is noted. Anterior break in the cyma line noted on lateral views. Medial talar head to deviation noted on AP radiograph.   Assessment: 1. pes planus right 2. PTTD right    Plan of Care:  1. Patient was evaluated. X-Rays reviewed.  2. Prescription for Medrol Dose Pak provided to patient. Then resume  taking Duexis.  3. Ankle brace dispensed.  4. Continue using custom orthotics.  5. Return to clinic in 4 weeks.    Edrick Kins, DPM [facility]  Dr. Edrick Kins, [address]                                        [place], [place] [address]                Office [phone]  Fax [phone]

## 2018-11-21 ENCOUNTER — Encounter: Payer: Self-pay | Admitting: Podiatry

## 2018-11-21 ENCOUNTER — Ambulatory Visit: Payer: No Typology Code available for payment source | Admitting: Podiatry

## 2018-11-21 ENCOUNTER — Other Ambulatory Visit: Payer: Self-pay

## 2018-11-21 DIAGNOSIS — M76821 Posterior tibial tendinitis, right leg: Secondary | ICD-10-CM | POA: Diagnosis not present

## 2018-11-21 DIAGNOSIS — M2141 Flat foot [pes planus] (acquired), right foot: Secondary | ICD-10-CM

## 2018-11-25 NOTE — Progress Notes (Signed)
   Subjective:  48 year old female presenting today for follow up evaluation of right foot pain. She states her pain has improved significantly. She notes some mild pain to the lateral foot increased by standing for long periods of time. She states the Medrol Dose Pak provided appreciable relief. There are no other modifying factors noted. Patient is here for further evaluation and treatment.   Past Medical History:  Diagnosis Date  . Chicken pox        Objective/Physical Exam General: The patient is alert and oriented x3 in no acute distress.  Dermatology: Skin is warm, dry and supple bilateral lower extremities. Negative for open lesions or macerations.  Vascular: Palpable pedal pulses bilaterally. No edema or erythema noted. Capillary refill within normal limits.  Neurological: Epicritic and protective threshold grossly intact bilaterally.   Musculoskeletal Exam: Range of motion within normal limits to all pedal and ankle joints bilateral. Muscle strength 5/5 in all groups bilateral.  Upon weightbearing there is a medial longitudinal arch collapse bilaterally. Remove foot valgus noted to the bilateral lower extremities with excessive pronation upon mid stance.   Assessment: 1. pes planus right 2. PTTD right - resolved    Plan of Care:  1. Patient was evaluated.  2. Continue using custom orthotics and taking Duexis as needed.  3. Recommended good shoe gear.  4. Return to clinic as needed.    Edrick Kins, DPM [facility]  Dr. Edrick Kins, [address]                                        [place], [place] [address]                Office [phone]  Fax [phone]

## 2018-12-07 ENCOUNTER — Telehealth: Payer: Self-pay | Admitting: Podiatry

## 2018-12-07 MED ORDER — METHYLPREDNISOLONE 4 MG PO TBPK: ORAL_TABLET | ORAL | 0 refills | Status: DC

## 2018-12-07 NOTE — Telephone Encounter (Signed)
Pt called and stated that she will need another 6 day pack of the medication Dr Amalia Hailey gave for pain. Using [facility] [address] in [place].

## 2018-12-07 NOTE — Addendum Note (Signed)
Addended by: Harriett Sine D on: 12/07/2018 01:25 PM   Modules accepted: Orders

## 2018-12-07 NOTE — Telephone Encounter (Signed)
I informed pt Dr. Amalia Hailey had refilled the steroid and stated she needed an appt prior to future refills.

## 2019-07-10 ENCOUNTER — Emergency Department (HOSPITAL_BASED_OUTPATIENT_CLINIC_OR_DEPARTMENT_OTHER)
Admission: EM | Admit: 2019-07-10 | Discharge: 2019-07-10 | Disposition: A | Discharge status: Home / Self Care | Payer: No Typology Code available for payment source | Attending: Emergency Medicine | Admitting: Emergency Medicine

## 2019-07-10 ENCOUNTER — Encounter (HOSPITAL_BASED_OUTPATIENT_CLINIC_OR_DEPARTMENT_OTHER): Payer: Self-pay

## 2019-07-10 ENCOUNTER — Emergency Department (HOSPITAL_BASED_OUTPATIENT_CLINIC_OR_DEPARTMENT_OTHER): Payer: No Typology Code available for payment source

## 2019-07-10 ENCOUNTER — Other Ambulatory Visit: Payer: Self-pay

## 2019-07-10 DIAGNOSIS — M25521 Pain in right elbow: Secondary | ICD-10-CM | POA: Diagnosis present

## 2019-07-10 MED ORDER — MELOXICAM 7.5 MG PO TABS: 7.5000 mg | ORAL_TABLET | Freq: Every day | ORAL | 0 refills | Status: AC

## 2019-07-10 NOTE — ED Triage Notes (Signed)
Pt c/o pain to right elbow x 1-2 months-pain worse with movement-denies injury-NAD-steady gait

## 2019-07-10 NOTE — ED Provider Notes (Signed)
[facility] EMERGENCY DEPARTMENT Provider Note   CSN: [id number] Arrival date & time: 07/10/19  1656     History   Chief Complaint Chief Complaint  Patient presents with  . Elbow Pain    HPI Samantha Holden is a 48 y.o. female who presents for evaluation of right elbow pain.  She reports is been an ongoing issue for about a month and has been intermittently occurring.  She states that she will have flares where she feels like it hurts more and then it improves.  She reports that over the last few days, it is hurt more constantly.  She states it is worse with movement and worse in the morning.  He states that she has tried ice, ibuprofen and icy hot with no improvement.  She states that when she does try and pick things up, she feels like it is weaker because of the pain.  She has not had any weakness where she has not been able to move her arm or lift things up.  She has not noted any overlying warmth or erythema.  She denies any fevers, numbness/weakness.  She denies any preceding trauma, injury, fall.  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     The history is provided by the patient.    Past Medical History:  Diagnosis Date  . Chicken pox     Patient Active Problem List   Diagnosis Date Noted  . Right ankle pain 02/16/2018    Past Surgical History:  Procedure Laterality Date  . DILATION AND CURETTAGE, DIAGNOSTIC / THERAPEUTIC  1992  . WISDOM TOOTH EXTRACTION       OB History   No obstetric history on file.      Home Medications    Prior to Admission medications   Medication Sig Start Date End Date Taking? Authorizing Provider  Ibuprofen-Famotidine (DUEXIS) 800-26.6 MG TABS Take by mouth 3 (three) times daily. As needed    [provider]  meloxicam (MOBIC) 7.5 MG tablet Take 1 tablet (7.5 mg total) by mouth daily for 10 days. 07/10/19 07/20/19  Volanda Napoleon, PA-C  methylPREDNISolone (MEDROL DOSEPAK)  4 MG TBPK tablet 6 day dose pack - take as directed 12/07/18   Edrick Kins, DPM  mirabegron ER (MYRBETRIQ) 25 MG TB24 tablet Take 1 tablet (25 mg total) by mouth daily. 07/18/18   Copland, Gay Filler, MD  nitroGLYCERIN (NITRODUR - DOSED IN MG/24 HR) 0.2 mg/hr patch Apply 1/4th patch to affected ankle, change daily 03/29/18   Hudnall, Sharyn Lull, MD    Family History Family History  Problem Relation Age of Onset  . Breast cancer Mother   . Arthritis Mother   . Hypertension Mother   . Cancer Father   . Bladder Cancer Father   . Heart attack Father   . Arthritis Maternal Grandmother   . Heart disease Maternal Grandfather   . Arthritis Maternal Grandfather   . Heart attack Maternal Grandfather   . Hypertension Maternal Grandfather     Social History Social History   Tobacco Use  . Smoking status: Never Smoker  . Smokeless tobacco: Never Used  Substance Use Topics  . Alcohol use: No  . Drug use: Never     Allergies   Patient has no known allergies.   Review of Systems Review of Systems  Constitutional: Negative for fever.  Musculoskeletal:       Elbow pain  Skin:  Negative for color change.  Neurological: Negative for weakness and numbness.  All other systems reviewed and are negative.    Physical Exam Updated Vital Signs BP 125/85 (BP Location: Left Arm)   Pulse 75   Temp 98.4 F (36.9 C) (Oral)   Resp 20   Ht 5\' 11"  (1.803 m)   Wt 134.3 kg   LMP 06/13/2019   SpO2 98%   BMI 41.28 kg/m   Physical Exam Vitals signs and nursing note reviewed.  Constitutional:      Appearance: She is well-developed.  HENT:     Head: Normocephalic and atraumatic.  Eyes:     General: No scleral icterus.       Right eye: No discharge.        Left eye: No discharge.     Conjunctiva/sclera: Conjunctivae normal.  Cardiovascular:     Pulses:          Radial pulses are 2+ on the right side and 2+ on the left side.  Pulmonary:     Effort: Pulmonary effort is normal.   Musculoskeletal:     Comments: There is palpation noted to the ulnar aspect of right elbow.  No deformity or crepitus noted.  No overlying warmth, erythema.  Flexion intact any difficulty.  She does report some pain with extension.  Extension is intact though.  No tenderness noted to the olecranon.  No overlying warmth, erythema.  No bony tenderness noted right shoulder, right forearm, right wrist.  No tenderness palpation of the left upper extremity.  Full range of motion without any difficulty.  Skin:    General: Skin is warm and dry.     Comments: Good distal cap refill.  RUE is not dusky in appearance or cool to touch.  Neurological:     Mental Status: She is alert.     Comments: Sensation intact along major nerve distributions of BUE. 5 out of 5 strength of bilateral upper extremity.  Equal grip strength  Psychiatric:        Speech: Speech normal.        Behavior: Behavior normal.      ED Treatments / Results  Labs (all labs ordered are listed, but only abnormal results are displayed) Labs Reviewed - No data to display  EKG None  Radiology Dg Elbow Complete Right  Result Date: 07/10/2019 CLINICAL DATA:  Pain EXAM: RIGHT ELBOW - COMPLETE 3+ VIEW COMPARISON:  None. FINDINGS: There is no evidence of fracture, dislocation, or joint effusion. There is no evidence of arthropathy or other focal bone abnormality. Soft tissues are unremarkable. IMPRESSION: Negative. Electronically Signed   By: Prudencio Pair M.D.   On: 07/10/2019 17:55    Procedures Procedures (including critical care time)  Medications Ordered in ED Medications - No data to display   Initial Impression / Assessment and Plan / ED Course  I have reviewed the triage vital signs and the nursing notes.  Pertinent labs & imaging results that were available during my care of the patient were reviewed by me and considered in my medical decision making (see chart for details).        48 year old female who presents  for evaluation of right elbow pain that is been intermittently occurring x1 month.  No trauma, injury, fall.  No overlying warmth, erythema, edema, fevers, numbness/weakness.  Reports it is worse with movement.  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  Comes in  today because she states she continues to have pain. Patient is afebrile, non-toxic appearing, sitting comfortably on examination table. Vital signs reviewed and stable. Patient is neurovascularly intact.  On exam, she has pain and tenderness noted to the ulnar aspect of her elbow.  No overlying warmth, erythema, edema.  No deformity or crepitus noted.  No weakness noted on my exam.  Consider musculoskeletal injury versus sprain versus ulnar nerve compression.  History/physical exam is not concerning for septic arthritis, DVT of upper extremity.  Her pain is isolated to the elbow.  Additionally, she does report that she felt like when she lifted things up, it was weak but it was secondary to pain and felt like she could not move it pain.  Not true weakness and this does not extend upper extremity.  History/physical exam not concerning for CVA.  We will plan for x-ray for evaluation of any acute bony abnormality.  X-ray reviewed.  No evidence of acute bony abnormality.  Discussed results with patient.  We will plan to treat with short course of Mobic.  Encouraged at home supportive care measures.  Patient instructed to follow-up with outpatient orthopedics if no improvement in symptoms in the next 2 weeks. At this time, patient exhibits no emergent life-threatening condition that require further evaluation in ED or admission. Patient had ample opportunity for questions and discussion. All patient's questions were answered with full understanding. Strict return precautions discussed. Patient expresses understanding and agreement to plan.   Portions of this note were generated with [employer].  Dictation errors may occur despite best attempts at proofreading.   Final Clinical Impressions(s) / ED Diagnoses   Final diagnoses:  Right elbow pain    ED Discharge Orders         Ordered    meloxicam (MOBIC) 7.5 MG tablet  Daily     07/10/19 1822           Desma Mcgregor 07/10/19 1849    Veryl Speak, MD 07/10/19 2345

## 2019-07-10 NOTE — Discharge Instructions (Signed)
You can take Tylenol or Ibuprofen as directed for pain. You can alternate Tylenol and Ibuprofen every 4 hours. If you take Tylenol at 1pm, then you can take Ibuprofen at 5pm. Then you can take Tylenol again at 9pm.   Take Mobic as directed.  If you are taking Mobic, do not take ibuprofen.    As we discussed, try to eliminate leaning on that elbow.  You have no improvement in symptoms the next 2 to 3 weeks, follow-up with referred orthopedic doctor.  Return the emergency department for any worsening pain, redness or swelling of elbow, fevers, numbness/weakness or any other worsening or concerning symptoms.

## 2019-12-19 ENCOUNTER — Other Ambulatory Visit: Payer: Self-pay

## 2019-12-20 ENCOUNTER — Emergency Department (HOSPITAL_BASED_OUTPATIENT_CLINIC_OR_DEPARTMENT_OTHER)
Admission: EM | Admit: 2019-12-20 | Discharge: 2019-12-20 | Disposition: A | Discharge status: Home / Self Care | Payer: No Typology Code available for payment source | Attending: Emergency Medicine | Admitting: Emergency Medicine

## 2019-12-20 ENCOUNTER — Other Ambulatory Visit: Payer: Self-pay

## 2019-12-20 ENCOUNTER — Emergency Department (HOSPITAL_BASED_OUTPATIENT_CLINIC_OR_DEPARTMENT_OTHER): Payer: No Typology Code available for payment source

## 2019-12-20 ENCOUNTER — Encounter (HOSPITAL_BASED_OUTPATIENT_CLINIC_OR_DEPARTMENT_OTHER): Payer: Self-pay

## 2019-12-20 DIAGNOSIS — M79605 Pain in left leg: Secondary | ICD-10-CM | POA: Diagnosis not present

## 2019-12-20 DIAGNOSIS — Z79899 Other long term (current) drug therapy: Secondary | ICD-10-CM | POA: Insufficient documentation

## 2019-12-20 DIAGNOSIS — R200 Anesthesia of skin: Secondary | ICD-10-CM | POA: Diagnosis present

## 2019-12-20 IMAGING — US US EXTREM LOW VENOUS*L*
1 series · 14 of 24 positions shown · non-contrast
Comparison: None.

CLINICAL DATA: 49-year-old female with left lower extremity pain
and numbness.

EXAM:
Left LOWER EXTREMITY VENOUS DOPPLER ULTRASOUND
TECHNIQUE: Gray-scale sonography with compression, as well as color and duplex
ultrasound, were performed to evaluate the deep venous system(s)
from the level of the common femoral vein through the popliteal and
proximal calf veins.

[Series 1: us extrem low venous*left* · 14 of 31 slices shown]
[im 1/31]
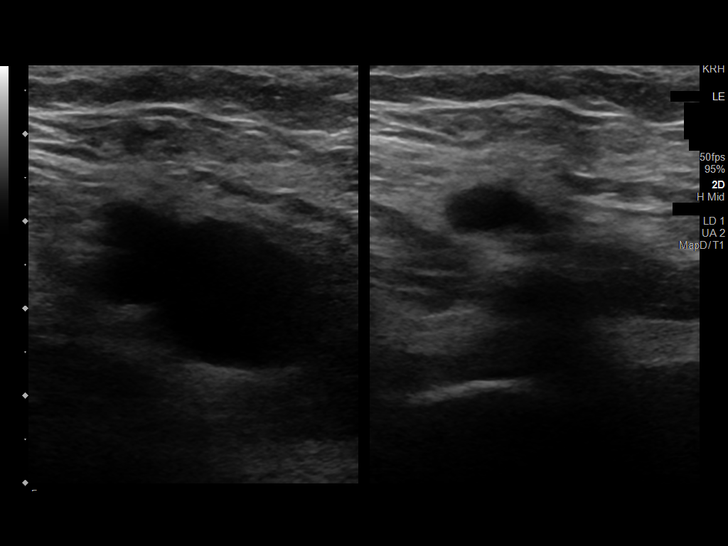
[im 3/31]
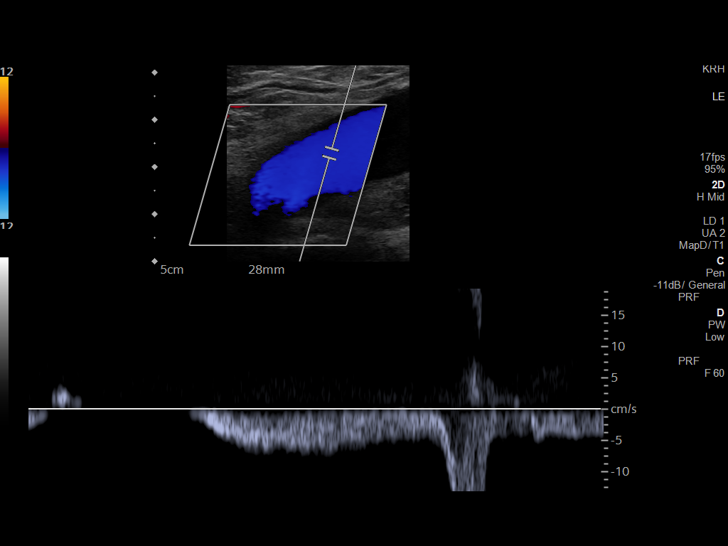
[im 6/31]
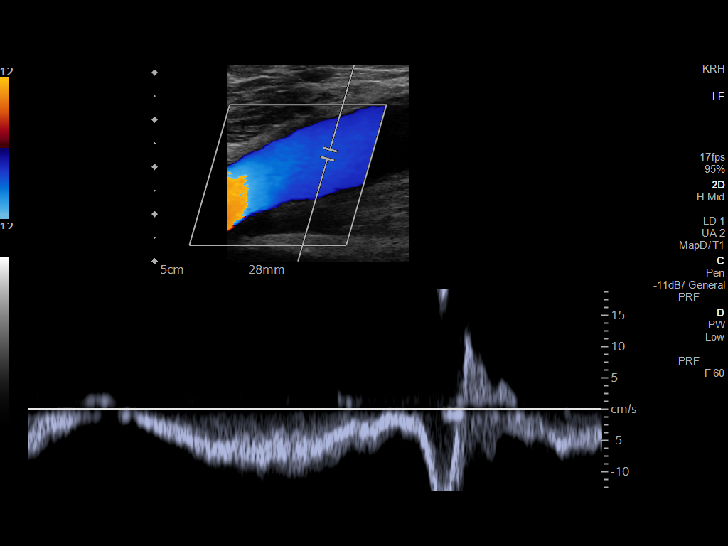
[im 8/31]
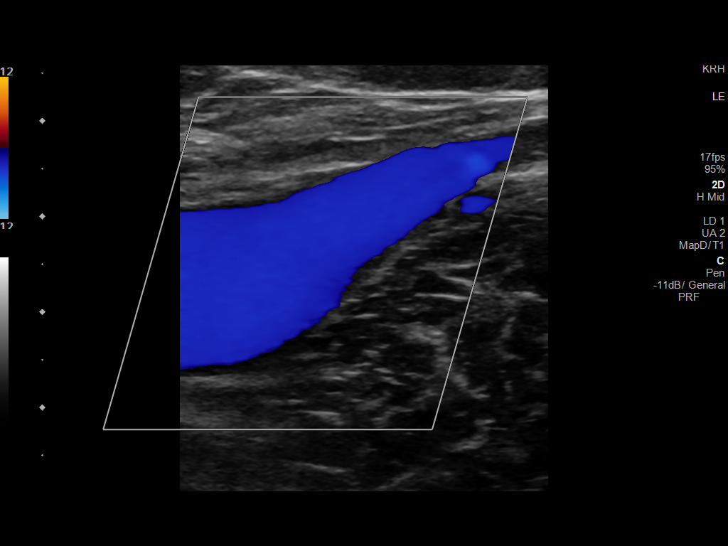
[im 10/31]
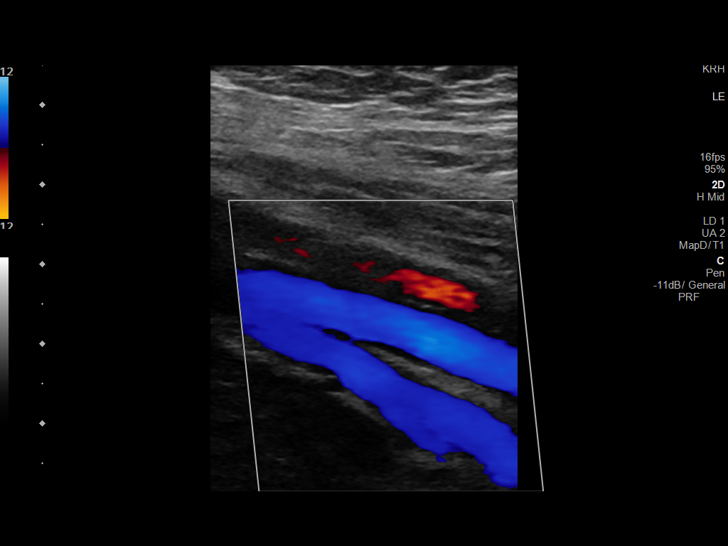
[im 12/31]
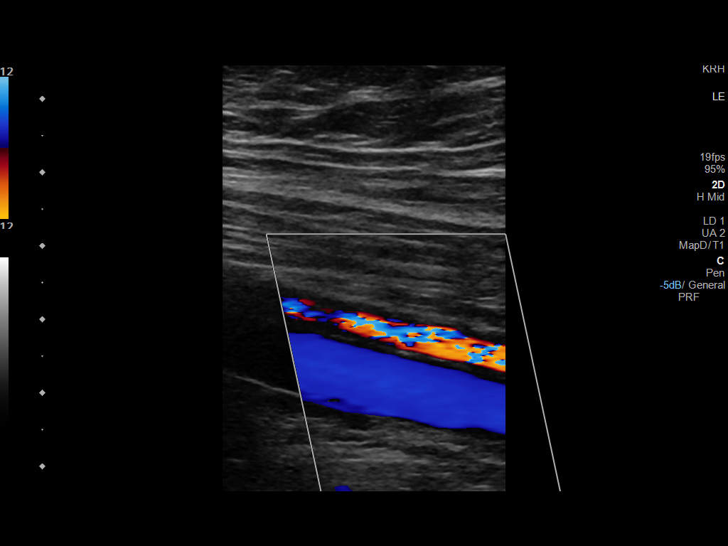
[im 15/31]
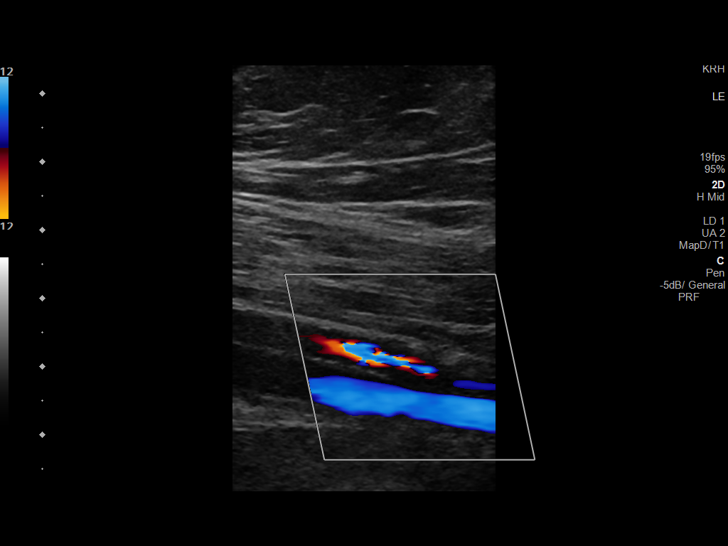
[im 16/31]
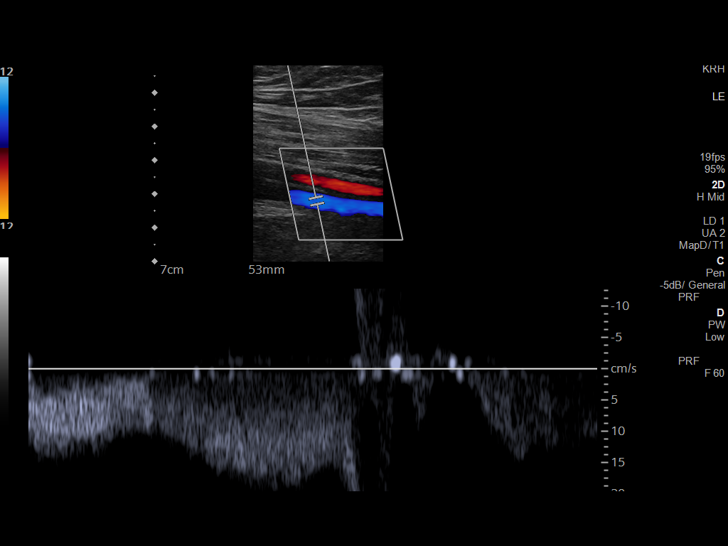
[im 19/31]
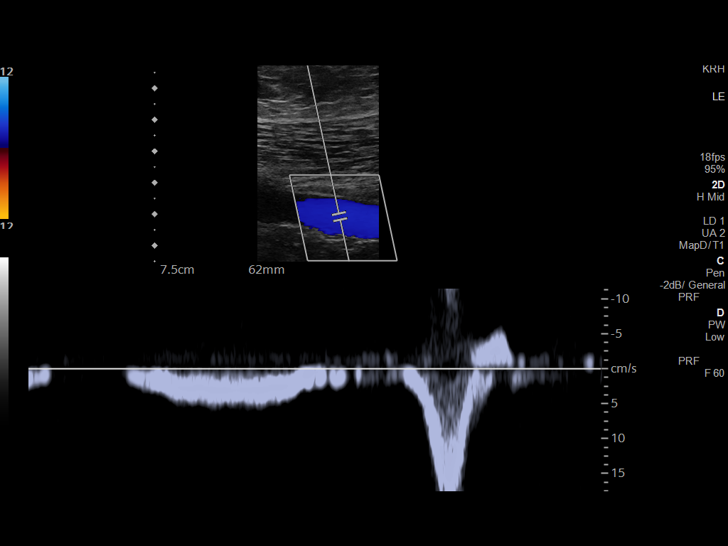
[im 21/31]
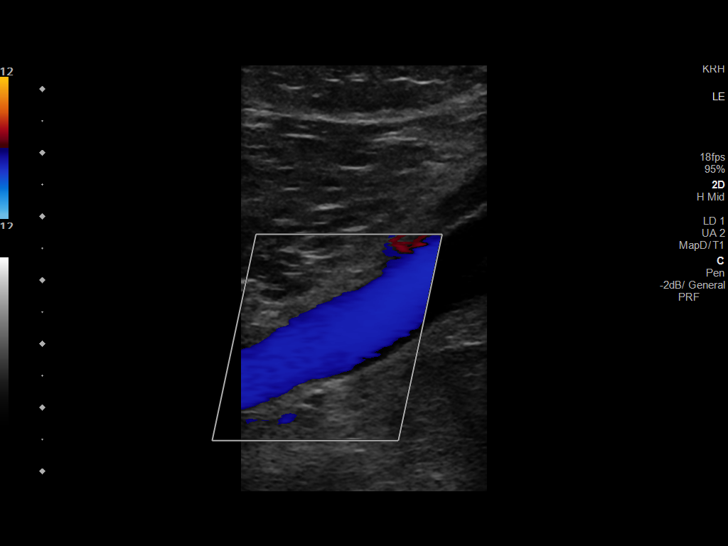
[im 24/31]
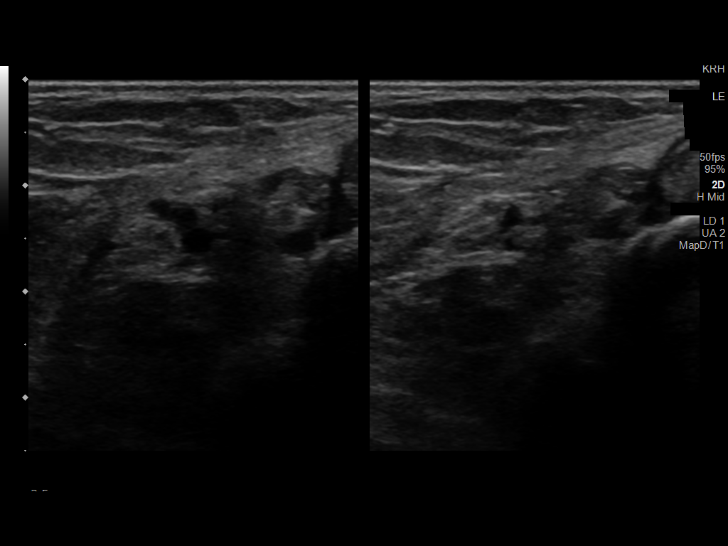
[im 25/31]
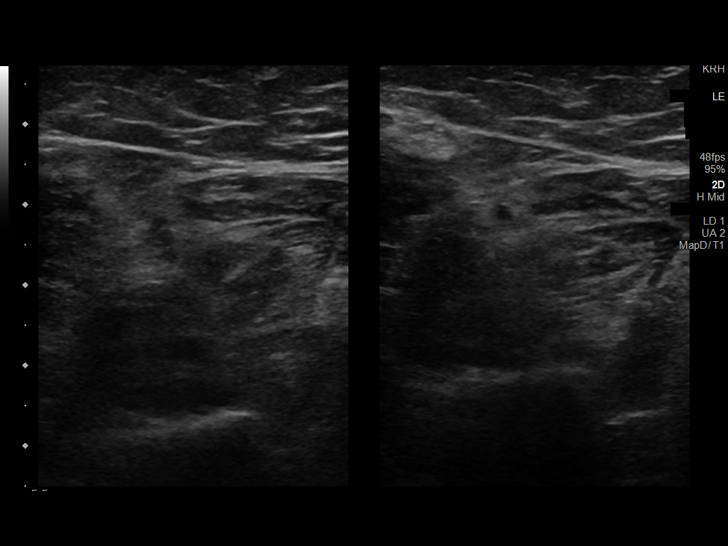
[im 28/31]
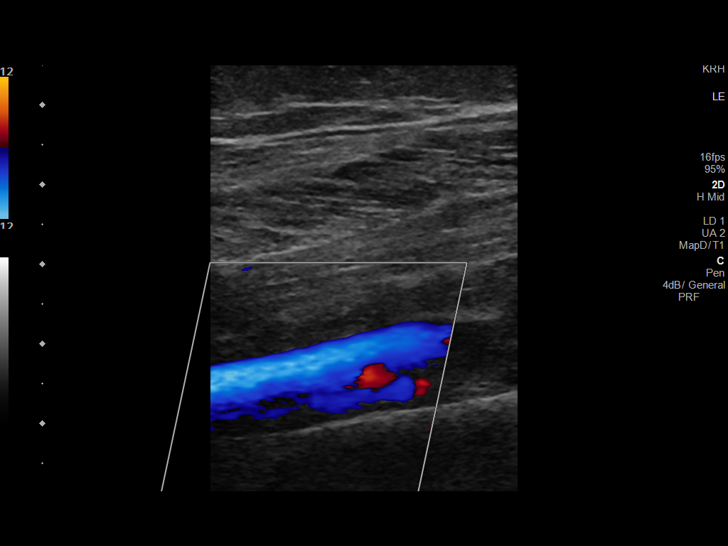
[im 31/31]
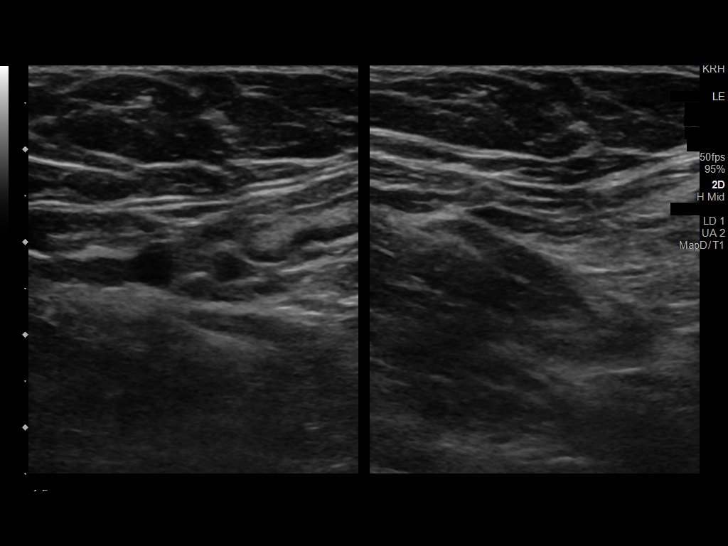

[14 of 24 positions shown; findings below may reference images not displayed]

FINDINGS: VENOUS

Normal compressibility of the common femoral, superficial femoral,
and popliteal veins, as well as the visualized calf veins.
Visualized portions of profunda femoral vein and great saphenous
vein unremarkable. No filling defects to suggest DVT on grayscale or
color Doppler imaging. Doppler waveforms show normal direction of
venous flow, normal respiratory phasicity and response to
augmentation.

Limited views of the contralateral common femoral vein are
unremarkable.

OTHER

None.

Limitations: none
IMPRESSION: No femoropopliteal DVT nor evidence of DVT within the visualized
calf veins.

If clinical symptoms are inconsistent or if there are persistent or
worsening symptoms, further imaging (possibly involving the iliac
veins) may be warranted.

## 2019-12-20 MED ORDER — PREDNISONE 10 MG (21) PO TBPK: ORAL_TABLET | Freq: Every day | ORAL | 0 refills | Status: DC

## 2019-12-20 MED ORDER — ACETAMINOPHEN 500 MG PO TABS: 500.0000 mg | ORAL_TABLET | Freq: Four times a day (QID) | ORAL | 0 refills | Status: DC | PRN

## 2019-12-20 MED ORDER — CYCLOBENZAPRINE HCL 10 MG PO TABS: 10.0000 mg | ORAL_TABLET | Freq: Two times a day (BID) | ORAL | 0 refills | Status: DC | PRN

## 2019-12-20 NOTE — ED Provider Notes (Signed)
[facility] Provider Note   CSN: [id number] Arrival date & time: 12/20/19  1322     History Chief Complaint  Patient presents with  . Numbness    Samantha Holden is a 49 y.o. female presents for evaluation of acute onset, persistent left lower extremity pain for 2 days.  Reports pain is constant, aching, radiates from the left hip along the posterior aspect of the lower extremity down to the foot.  She reports numbness to the plantar aspect of the foot now extending into the left great toe.  Pain worsens with certain movements, ambulation.  She feels a little bit of relief when laying on her right side with her left knee flexed.  She denies bowel or bladder incontinence, saddle anesthesia, fevers, history of IV drug use, cancer, or low back pain.  She denies urinary symptoms or abdominal pain.  No nausea, vomiting, chest pain or shortness of breath.  She does feel more significant pain to the left calf.  She took 400 mg ibuprofen without relief of symptoms.  She denies recent travel or surgeries, hemoptysis, prior history of DVT or PE.  The history is provided by the patient.       Past Medical History:  Diagnosis Date  . Chicken pox     Patient Active Problem List   Diagnosis Date Noted  . Right ankle pain 02/16/2018    Past Surgical History:  Procedure Laterality Date  . DILATION AND CURETTAGE, DIAGNOSTIC / THERAPEUTIC  1992  . WISDOM TOOTH EXTRACTION       OB History   No obstetric history on file.     Family History  Problem Relation Age of Onset  . Breast cancer Mother   . Arthritis Mother   . Hypertension Mother   . Cancer Father   . Bladder Cancer Father   . Heart attack Father   . Arthritis Maternal Grandmother   . Heart disease Maternal Grandfather   . Arthritis Maternal Grandfather   . Heart attack Maternal Grandfather   . Hypertension Maternal Grandfather     Social History   Tobacco Use  . Smoking status: Never  Smoker  . Smokeless tobacco: Never Used  Substance Use Topics  . Alcohol use: No  . Drug use: Never    Home Medications Prior to Admission medications   Medication Sig Start Date End Date Taking? Authorizing Provider  acetaminophen (TYLENOL) 500 MG tablet Take 1 tablet (500 mg total) by mouth every 6 (six) hours as needed. 12/20/19   Shiniqua Groseclose A, PA-C  cyclobenzaprine (FLEXERIL) 10 MG tablet Take 1 tablet (10 mg total) by mouth 2 (two) times daily as needed for muscle spasms. 12/20/19   Renalda Locklin A, PA-C  Ibuprofen-Famotidine (DUEXIS) 800-26.6 MG TABS Take by mouth 3 (three) times daily. As needed    [provider]  methylPREDNISolone (MEDROL DOSEPAK) 4 MG TBPK tablet 6 day dose pack - take as directed 12/07/18   Edrick Kins, DPM  mirabegron ER (MYRBETRIQ) 25 MG TB24 tablet Take 1 tablet (25 mg total) by mouth daily. 07/18/18   Copland, Gay Filler, MD  nitroGLYCERIN (NITRODUR - DOSED IN MG/24 HR) 0.2 mg/hr patch Apply 1/4th patch to affected ankle, change daily 03/29/18   Hudnall, Sharyn Lull, MD  predniSONE (STERAPRED UNI-PAK 21 TAB) 10 MG (21) TBPK tablet Take by mouth daily. Take 6 tabs by mouth daily  for 2 days, then 5 tabs for 2 days, then 4 tabs for 2  days, then 3 tabs for 2 days, 2 tabs for 2 days, then 1 tab by mouth daily for 2 days 12/20/19   Rodell Perna A, PA-C    Allergies    Patient has no known allergies.  Review of Systems   Review of Systems  Constitutional: Negative for chills and fever.  Respiratory: Negative for shortness of breath.   Cardiovascular: Negative for chest pain.  Gastrointestinal: Negative for abdominal pain, nausea and vomiting.  Genitourinary: Negative for dysuria, frequency, hematuria and urgency.  Musculoskeletal: Positive for arthralgias and myalgias. Negative for back pain.  Neurological: Positive for numbness. Negative for weakness.  All other systems reviewed and are negative.   Physical Exam Updated Vital Signs BP 134/78 (BP Location:  Right Arm)   Pulse 84   Temp 98.2 F (36.8 C) (Oral)   Resp 20   Ht 5\' 11"  (1.803 m)   Wt 131.1 kg   LMP 11/29/2019   SpO2 99%   BMI 40.31 kg/m   Physical Exam Vitals and nursing note reviewed.  Constitutional:      General: She is not in acute distress.    Appearance: She is well-developed. She is obese.  HENT:     Head: Normocephalic and atraumatic.  Eyes:     General:        Right eye: No discharge.        Left eye: No discharge.     Conjunctiva/sclera: Conjunctivae normal.  Neck:     Vascular: No JVD.     Trachea: No tracheal deviation.  Cardiovascular:     Rate and Rhythm: Normal rate.     Comments: 2+ radial and DP/PT pulses bilaterally.  Bevelyn Buckles' sign present on the left. Pulmonary:     Effort: Pulmonary effort is normal.  Abdominal:     General: There is no distension.     Palpations: Abdomen is soft.     Tenderness: There is no abdominal tenderness. There is no guarding or rebound.  Musculoskeletal:        General: Tenderness present.     Cervical back: Neck supple.     Comments: No midline spine tenderness, no paralumbar muscle tenderness.  No deformity, crepitus, or step-off.  No SI joint tenderness.  5/5 strength of BLE major muscle groups.  Pain elicited with flexion of the left knee and hip as well as internal and external rotation of the left hip. She also has left calf tenderness.  No swelling noted.  Skin:    Findings: No erythema.  Neurological:     Mental Status: She is alert.     Comments: Ambulatory with antalgic gait.  Altered sensation to light touch of the plantar aspect of the left foot extending to the left great toe as well as the posterior aspect of the left lower extremity.  Psychiatric:        Behavior: Behavior normal.     ED Results / Procedures / Treatments   Labs (all labs ordered are listed, but only abnormal results are displayed) Labs Reviewed - No data to display  EKG None  Radiology US Venous Img Lower  Left (DVT Study)   Result Date: 12/20/2019 CLINICAL DATA:  49 year old female with left lower extremity pain and numbness. EXAM: Left LOWER EXTREMITY VENOUS DOPPLER ULTRASOUND TECHNIQUE: Gray-scale sonography with compression, as well as color and duplex ultrasound, were performed to evaluate the deep venous system(s) from the level of the common femoral vein through the popliteal and proximal calf veins. COMPARISON:  None. FINDINGS: VENOUS Normal compressibility of the common femoral, superficial femoral, and popliteal veins, as well as the visualized calf veins. Visualized portions of profunda femoral vein and great saphenous vein unremarkable. No filling defects to suggest DVT on grayscale or color Doppler imaging. Doppler waveforms show normal direction of venous flow, normal respiratory phasicity and response to augmentation. Limited views of the contralateral common femoral vein are unremarkable. OTHER None. Limitations: none IMPRESSION: No femoropopliteal DVT nor evidence of DVT within the visualized calf veins. If clinical symptoms are inconsistent or if there are persistent or worsening symptoms, further imaging (possibly involving the iliac veins) may be warranted. Electronically Signed   By: Anner Crete M.D.   On: 12/20/2019 17:16    Procedures Procedures (including critical care time)  Medications Ordered in ED Medications - No data to display  ED Course  I have reviewed the triage vital signs and the nursing notes.  Pertinent labs & imaging results that were available during my care of the patient were reviewed by me and considered in my medical decision making (see chart for details).    MDM Rules/Calculators/A&P                      Patient with left lower extremity pain radiating from the posterior aspect of the left hip down to the foot.  She has some numbness involving the plantar aspect of the left foot.  No weakness on examination.  She is ambulatory despite pain.  No midline spine  tenderness.  No red flag signs concerning for cauda equina or spinal abscess.  She is afebrile and vital signs are stable.  She is nontoxic in appearance.  No concern for dissection, no urinary symptoms.  DVT study was obtained due to calf pain on examination which was negative.  She has an appointment to see her PCP on Monday.  I encouraged her to keep this appointment and to discuss the possibility of repeat DVT study at that time.  I suspect she is likely experiencing musculoskeletal pain, possibly sciatica.  Doubt septic arthritis, osteomyelitis, no evidence of secondary skin infection.  Discussed symptomatic management, will discharge with prednisone taper and Flexeril.  Advised of appropriate use of these medications.  She will follow with your PCP and orthopedics on an outpatient basis.  Discussed strict ED return precautions.  Patient and visitor at the bedside verbalized understanding of and agreement with plan and patient is stable for discharge at this time.  Final Clinical Impression(s) / ED Diagnoses Final diagnoses:  Acute pain of left lower extremity    Rx / DC Orders ED Discharge Orders         Ordered    cyclobenzaprine (FLEXERIL) 10 MG tablet  2 times daily PRN     12/20/19 1813    predniSONE (STERAPRED UNI-PAK 21 TAB) 10 MG (21) TBPK tablet  Daily     12/20/19 1813    acetaminophen (TYLENOL) 500 MG tablet  Every 6 hours PRN     12/20/19 1814           Renita Papa, PA-C 12/20/19 1904    Margette Fast, MD 12/21/19 1052

## 2019-12-20 NOTE — ED Triage Notes (Addendum)
Pt c/o numbness, tingling to left LE from hip to foot-pain to calf-sx started yesterday-denies injury-limping gait

## 2019-12-20 NOTE — ED Notes (Signed)
Patient transported to Ultrasound 

## 2019-12-20 NOTE — Discharge Instructions (Signed)
1. Medications: Start taking steroid taper as prescribed.  Do not take ibuprofen, Advil, Motrin, or Aleve while taking this medication.  You can take 1 to 2 tablets of Tylenol every 6 hours additionally however.  When you are done taking the steroid taper you can alternate 600 mg of ibuprofen and [phone] mg of Tylenol every 3 hours as needed for pain. Do not exceed 4000 mg of Tylenol daily.  Take ibuprofen with food to avoid upset stomach issues.  You can take Flexeril as needed for muscle spasm up to twice daily but do not drive, drink alcohol, or operate heavy machinery while taking this medicine because it may make you drowsy.  I typically recommend taking this medicine only at night when you are going to sleep.  You can also cut these tablets in half if they make you feel very drowsy.  Apply lidocaine patches to areas of pain.  2. Treatment: rest, drink plenty of fluids, gentle stretching as discussed (see attached), alternate ice and heat (or stick with whichever feels best) 20 minutes on 20 minutes off. 3. Follow Up: Please followup with your primary doctor in 3-7 days for discussion of your diagnoses and further evaluation after today's visit; I have also provided information for orthopedics you can follow-up with; return to the ER for worsening back pain, difficulty walking, loss of bowel or bladder control or other concerning symptoms
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
## 2019-12-21 NOTE — Patient Instructions (Addendum)
It was good to see you again today- thank you for bringing your concern to our attention.   We will get you set up for breast imaging asap at [facility].  Please let me know if you don't hear about this appt in the next day or so,  Certainly also feel free to give them a call today  [phone]

## 2019-12-23 ENCOUNTER — Other Ambulatory Visit: Payer: Self-pay

## 2019-12-23 ENCOUNTER — Encounter: Payer: Self-pay | Admitting: Family Medicine

## 2019-12-23 ENCOUNTER — Ambulatory Visit: Payer: No Typology Code available for payment source | Admitting: Family Medicine

## 2019-12-23 VITALS — BP 126/80 | HR 92 | Resp 17 | Ht 71.0 in | Wt 289.0 lb

## 2019-12-23 DIAGNOSIS — Z130 Encounter for screening for diseases of the blood and blood-forming organs and certain disorders involving the immune mechanism: Secondary | ICD-10-CM | POA: Diagnosis not present

## 2019-12-23 DIAGNOSIS — R7303 Prediabetes: Secondary | ICD-10-CM | POA: Diagnosis not present

## 2019-12-23 DIAGNOSIS — Z13220 Encounter for screening for lipoid disorders: Secondary | ICD-10-CM | POA: Diagnosis not present

## 2019-12-23 DIAGNOSIS — Z1211 Encounter for screening for malignant neoplasm of colon: Secondary | ICD-10-CM

## 2019-12-23 DIAGNOSIS — N6310 Unspecified lump in the right breast, unspecified quadrant: Secondary | ICD-10-CM

## 2019-12-23 DIAGNOSIS — Z1329 Encounter for screening for other suspected endocrine disorder: Secondary | ICD-10-CM

## 2019-12-23 LAB — COMPREHENSIVE METABOLIC PANEL
ALT: 9 U/L (ref 0–35)
AST: 11 U/L (ref 0–37)
Albumin: 3.7 g/dL (ref 3.5–5.2)
Alkaline Phosphatase: 80 U/L (ref 39–117)
BUN: 17 mg/dL (ref 6–23)
CO2: 24 mEq/L (ref 19–32)
Calcium: 9.1 mg/dL (ref 8.4–10.5)
Chloride: 102 mEq/L (ref 96–112)
Creatinine, Ser: 0.84 mg/dL (ref 0.40–1.20)
GFR: 86.82 mL/min (ref >60.00)
Glucose, Bld: 131 mg/dL — ABNORMAL HIGH (ref 70–99)
Potassium: 4.2 mEq/L (ref 3.5–5.1)
Sodium: 136 mEq/L (ref 135–145)
Total Bilirubin: 0.4 mg/dL (ref 0.2–1.2)
Total Protein: 7.6 g/dL (ref 6.0–8.3)

## 2019-12-23 LAB — CBC
HCT: 35.1 % — ABNORMAL LOW (ref 36.0–46.0)
Hemoglobin: 11.2 g/dL — ABNORMAL LOW (ref 12.0–15.0)
MCHC: 32 g/dL (ref 30.0–36.0)
MCV: 76.2 fl — ABNORMAL LOW (ref 78.0–100.0)
Platelets: 344 10*3/uL (ref 150.0–400.0)
RBC: 4.6 Mil/uL (ref 3.87–5.11)
RDW: 17.5 % — ABNORMAL HIGH (ref 11.5–15.5)
WBC: 6.1 10*3/uL (ref 4.0–10.5)

## 2019-12-23 LAB — LIPID PANEL
Cholesterol: 176 mg/dL (ref 0–200)
HDL: 50 mg/dL (ref >39.00)
LDL Cholesterol: 109 mg/dL — ABNORMAL HIGH (ref 0–99)
NonHDL: 125.73
Total CHOL/HDL Ratio: 4
Triglycerides: 85 mg/dL (ref 0.0–149.0)
VLDL: 17 mg/dL (ref 0.0–40.0)

## 2019-12-23 LAB — TSH: TSH: 1.68 u[IU]/mL (ref 0.35–4.50)

## 2019-12-23 LAB — HEMOGLOBIN A1C: Hgb A1c MFr Bld: 6.2 % (ref 4.6–6.5)

## 2019-12-23 NOTE — Addendum Note (Signed)
Addended by: Lamar Blinks C on: 12/23/2019 02:22 PM   Modules accepted: Orders

## 2019-12-24 ENCOUNTER — Encounter: Payer: Self-pay | Admitting: Gastroenterology

## 2019-12-30 ENCOUNTER — Ambulatory Visit (INDEPENDENT_AMBULATORY_CARE_PROVIDER_SITE_OTHER): Payer: No Typology Code available for payment source | Admitting: Family Medicine

## 2019-12-30 ENCOUNTER — Other Ambulatory Visit: Payer: Self-pay

## 2019-12-30 ENCOUNTER — Encounter: Payer: Self-pay | Admitting: Family Medicine

## 2019-12-30 VITALS — BP 120/84 | HR 94 | Ht 72.0 in | Wt 289.0 lb

## 2019-12-30 DIAGNOSIS — M25872 Other specified joint disorders, left ankle and foot: Secondary | ICD-10-CM | POA: Insufficient documentation

## 2019-12-30 NOTE — Assessment & Plan Note (Signed)
Initially had pain that was more in a radicular pattern.  Still could be contributed to that.  She does have pes planus with what appears to be impingement in this area.   -Placed a scaphoid pad as well as a low heel wedge to try to open up the lateral and posterior compartment. -Counseled on home exercise therapy and supportive care. -Provided Pennsaid samples. -Could consider imaging or injection or physical therapy.

## 2019-12-30 NOTE — Progress Notes (Signed)
Medication Samples have been provided to the patient.  Drug name: Pennsaid       Strength: 2%        Qty: 2 Boxes  [id number]  Exp.Date: 07/2020  Dosing instructions: Use a pea size amount and rub gently.  The patient has been instructed regarding the correct time, dose, and frequency of taking this medication, including desired effects and most common side effects.   Sherrie George, MA 2:58 PM 12/30/2019

## 2019-12-30 NOTE — Patient Instructions (Signed)
Nice to meet you Please try the pennsaid  Please try the exercises  Please try elevation  Pleas avoid walking barefoot   Please send me a message in MyChart with any questions or updates.  Please see me back in 4 weeks or sooner if needed.   --Dr. Raeford Razor

## 2019-12-30 NOTE — Progress Notes (Signed)
Samantha Holden - 49 y.o. female MRN [id number]  Date of birth: 1971/09/11  SUBJECTIVE:  Including CC & ROS.  Chief Complaint  Patient presents with  . Ankle Pain    left ankle    Samantha Holden is a 49 y.o. female that is presenting with left ankle pain.  The pain is been ongoing for a few days.  She notices it in the posterior lateral compartment of the hindfoot.  Seems to be worse with prolonged standing.  She does have an altered sensation occurring in the foot as well.  She was seen in the emergency department on 4/9.  She is provided prednisone and muscle relaxer.  Little improvement with prednisone thus far.  Thought it may have a component of radicular symptoms.   Review of Systems See HPI   HISTORY: Past Medical, Surgical, Social, and Family History Reviewed & Updated per EMR.   Pertinent Historical Findings include:  Past Medical History:  Diagnosis Date  . Chicken pox     Past Surgical History:  Procedure Laterality Date  . DILATION AND CURETTAGE, DIAGNOSTIC / THERAPEUTIC  1992  . WISDOM TOOTH EXTRACTION      Family History  Problem Relation Age of Onset  . Breast cancer Mother   . Arthritis Mother   . Hypertension Mother   . Cancer Father   . Bladder Cancer Father   . Heart attack Father   . Arthritis Maternal Grandmother   . Heart disease Maternal Grandfather   . Arthritis Maternal Grandfather   . Heart attack Maternal Grandfather   . Hypertension Maternal Grandfather     Social History   Socioeconomic History  . Marital status: Married    Spouse name: Not on file  . Number of children: Not on file  . Years of education: Not on file  . Highest education level: Not on file  Occupational History  . Not on file  Tobacco Use  . Smoking status: Never Smoker  . Smokeless tobacco: Never Used  Substance and Sexual Activity  . Alcohol use: No  . Drug use: Never  . Sexual activity: Not on file  Other Topics Concern  . Not on file  Social History  Narrative  . Not on file   Social Determinants of Health   Financial Resource Strain:   . Difficulty of Paying Living Expenses:   Food Insecurity:   . Worried About Charity fundraiser in the Last Year:   . Arboriculturist in the Last Year:   Transportation Needs:   . Film/video editor (Medical):   Marland Kitchen Lack of Transportation (Non-Medical):   Physical Activity:   . Days of Exercise per Week:   . Minutes of Exercise per Session:   Stress:   . Feeling of Stress :   Social Connections:   . Frequency of Communication with Friends and Family:   . Frequency of Social Gatherings with Friends and Family:   . Attends Religious Services:   . Active Member of Clubs or Organizations:   . Attends Archivist Meetings:   Marland Kitchen Marital Status:   Intimate Partner Violence:   . Fear of Current or Ex-Partner:   . Emotionally Abused:   Marland Kitchen Physically Abused:   . Sexually Abused:      PHYSICAL EXAM:  VS: BP 120/84   Pulse 94   Ht 6' (1.829 m)   Wt 289 lb (131.1 kg)   BMI 39.20 kg/m  Physical Exam Gen: NAD,  alert, cooperative with exam, well-appearing MSK:  Left ankle/foot: Severe pes planus. Normal strength to resistance. Tenderness to palpation over the peroneal tendons as well as the posterior aspect of the lateral ankle. Normal Achilles. No swelling over the dorsum of the foot. Has swelling over the anterior lateral ankle joint as well. Neurovascularly intact     ASSESSMENT & PLAN:   Impingement syndrome of left ankle Initially had pain that was more in a radicular pattern.  Still could be contributed to that.  She does have pes planus with what appears to be impingement in this area.   -Placed a scaphoid pad as well as a low heel wedge to try to open up the lateral and posterior compartment. -Counseled on home exercise therapy and supportive care. -Provided Pennsaid samples. -Could consider imaging or injection or physical therapy.

## 2020-01-07 ENCOUNTER — Encounter: Payer: Self-pay | Admitting: Family Medicine

## 2020-01-09 ENCOUNTER — Ambulatory Visit (AMBULATORY_SURGERY_CENTER): Payer: Self-pay | Admitting: *Deleted

## 2020-01-09 ENCOUNTER — Other Ambulatory Visit: Payer: Self-pay

## 2020-01-09 VITALS — Temp 97.3°F | Ht 72.0 in | Wt 295.0 lb

## 2020-01-09 DIAGNOSIS — Z01818 Encounter for other preprocedural examination: Secondary | ICD-10-CM

## 2020-01-09 DIAGNOSIS — Z1211 Encounter for screening for malignant neoplasm of colon: Secondary | ICD-10-CM

## 2020-01-09 NOTE — Progress Notes (Signed)
Patient is here in-person for PV. Patient denies any allergies to eggs or soy. Patient denies any problems with anesthesia/sedation. Patient denies any oxygen use at home. Patient denies taking any diet/weight loss medications or blood thinners. Patient is not being treated for MRSA or C-diff. Patient is aware of our care-partner policy and 0000000 safety protocol. EMMI education assisgned to the patient for the procedure, this was explained and instructions given to patient.   COVID-19 screening test is on 5/10, the pt is aware.

## 2020-01-20 ENCOUNTER — Ambulatory Visit (INDEPENDENT_AMBULATORY_CARE_PROVIDER_SITE_OTHER): Payer: No Typology Code available for payment source

## 2020-01-20 ENCOUNTER — Other Ambulatory Visit: Payer: Self-pay

## 2020-01-20 ENCOUNTER — Other Ambulatory Visit: Payer: Self-pay | Admitting: Gastroenterology

## 2020-01-20 ENCOUNTER — Ambulatory Visit (INDEPENDENT_AMBULATORY_CARE_PROVIDER_SITE_OTHER): Payer: No Typology Code available for payment source | Admitting: Podiatry

## 2020-01-20 DIAGNOSIS — M76822 Posterior tibial tendinitis, left leg: Secondary | ICD-10-CM

## 2020-01-20 DIAGNOSIS — M76821 Posterior tibial tendinitis, right leg: Secondary | ICD-10-CM

## 2020-01-20 DIAGNOSIS — G5792 Unspecified mononeuropathy of left lower limb: Secondary | ICD-10-CM

## 2020-01-20 DIAGNOSIS — M2141 Flat foot [pes planus] (acquired), right foot: Secondary | ICD-10-CM

## 2020-01-20 DIAGNOSIS — M659 Synovitis and tenosynovitis, unspecified: Secondary | ICD-10-CM | POA: Diagnosis not present

## 2020-01-20 DIAGNOSIS — Z1159 Encounter for screening for other viral diseases: Secondary | ICD-10-CM

## 2020-01-20 DIAGNOSIS — M2142 Flat foot [pes planus] (acquired), left foot: Secondary | ICD-10-CM

## 2020-01-20 LAB — SARS CORONAVIRUS 2 (TAT 6-24 HRS): SARS Coronavirus 2: NEGATIVE

## 2020-01-21 ENCOUNTER — Encounter: Payer: Self-pay | Admitting: Gastroenterology

## 2020-01-22 MED ORDER — MELOXICAM 15 MG PO TABS
15.0000 mg | ORAL_TABLET | Freq: Every day | ORAL | 1 refills | Status: DC
Start: 2020-01-22 — End: 2020-03-17

## 2020-01-23 ENCOUNTER — Ambulatory Visit (AMBULATORY_SURGERY_CENTER): Payer: No Typology Code available for payment source | Admitting: Gastroenterology

## 2020-01-23 ENCOUNTER — Other Ambulatory Visit: Payer: Self-pay

## 2020-01-23 ENCOUNTER — Encounter: Payer: Self-pay | Admitting: Gastroenterology

## 2020-01-23 VITALS — BP 111/80 | HR 57 | Temp 97.9°F | Resp 13 | Ht 72.0 in | Wt 295.0 lb

## 2020-01-23 DIAGNOSIS — Z1211 Encounter for screening for malignant neoplasm of colon: Secondary | ICD-10-CM | POA: Diagnosis not present

## 2020-01-23 DIAGNOSIS — D125 Benign neoplasm of sigmoid colon: Secondary | ICD-10-CM

## 2020-01-23 DIAGNOSIS — K635 Polyp of colon: Secondary | ICD-10-CM | POA: Diagnosis not present

## 2020-01-23 DIAGNOSIS — K621 Rectal polyp: Secondary | ICD-10-CM

## 2020-01-23 DIAGNOSIS — D129 Benign neoplasm of anus and anal canal: Secondary | ICD-10-CM

## 2020-01-23 DIAGNOSIS — K640 First degree hemorrhoids: Secondary | ICD-10-CM

## 2020-01-23 DIAGNOSIS — D128 Benign neoplasm of rectum: Secondary | ICD-10-CM

## 2020-01-23 MED ORDER — SODIUM CHLORIDE 0.9 % IV SOLN: 500.0000 mL | Freq: Once | INTRAVENOUS | Status: DC

## 2020-01-23 NOTE — Progress Notes (Signed)
   Subjective:  49 y.o. female presenting today with a chief complaint of numbness to the left dorsal foot that began 2-3 weeks ago. She reports an associated throbbing sensation. Standing increases the symptoms. She has been elevating the foot for treatment. Patient is here for further evaluation and treatment.   Past Medical History:  Diagnosis Date  . Allergy   . Chicken pox        Objective/Physical Exam General: The patient is alert and oriented x3 in no acute distress.  Dermatology: Skin is warm, dry and supple bilateral lower extremities. Negative for open lesions or macerations.  Vascular: Palpable pedal pulses bilaterally. No edema or erythema noted. Capillary refill within normal limits.  Neurological: Paresthesia noted to the left dorsal foot. Epicritic and protective threshold grossly intact bilaterally.   Musculoskeletal Exam: Range of motion within normal limits to all pedal and ankle joints bilateral. Muscle strength 5/5 in all groups bilateral.  Upon weightbearing there is a medial longitudinal arch collapse bilaterally. Remove foot valgus noted to the bilateral lower extremities with excessive pronation upon mid stance. Pain with palpation noted to the anterior, lateral and medial aspects of the left ankle joint.   Radiographic Exam:  Normal osseous mineralization. Joint spaces preserved. No fracture/dislocation/boney destruction.   Pes planus noted on radiographic exam lateral views. Decreased calcaneal inclination and metatarsal declination angle is noted. Anterior break in the cyma line noted on lateral views. Medial talar head to deviation noted on AP radiograph.   Assessment: 1. pes planus bilateral 2. Neuritis left foot 3. Left ankle synovitis    Plan of Care:  1. Patient was evaluated. X-Rays reviewed.  2. Injection of 0.5 mLs Celestone Soluspan injected into the left ankle joint.  3. Prescription for Medrol Dose Pak provided to patient. 4. Prescription  for Meloxicam provided to patient. 5. Return to clinic as needed.    Edrick Kins, DPM [facility]  Dr. Edrick Kins, [address]                                        [place], [place] [address]                Office [phone]  Fax [phone]

## 2020-01-23 NOTE — Progress Notes (Signed)
Pt's states no medical or surgical changes since previsit or office visit.  VS by DT. Temp by LS.

## 2020-01-23 NOTE — Progress Notes (Signed)
A and O x3. Report to RN. Tolerated MAC anesthesia well.

## 2020-01-23 NOTE — Patient Instructions (Signed)
Handouts given:  Polyps, hemorrhoids,  Resume previous diet Continue present medications Await pathology results    YOU HAD AN ENDOSCOPIC PROCEDURE TODAY AT [facility]:   Refer to the procedure report that was given to you for any specific questions about what was found during the examination.  If the procedure report does not answer your questions, please call your gastroenterologist to clarify.  If you requested that your care partner not be given the details of your procedure findings, then the procedure report has been included in a sealed envelope for you to review at your convenience later.  YOU SHOULD EXPECT: Some feelings of bloating in the abdomen. Passage of more gas than usual.  Walking can help get rid of the air that was put into your GI tract during the procedure and reduce the bloating. If you had a lower endoscopy (such as a colonoscopy or flexible sigmoidoscopy) you may notice spotting of blood in your stool or on the toilet paper. If you underwent a bowel prep for your procedure, you may not have a normal bowel movement for a few days.  Please Note:  You might notice some irritation and congestion in your nose or some drainage.  This is from the oxygen used during your procedure.  There is no need for concern and it should clear up in a day or so.  SYMPTOMS TO REPORT IMMEDIATELY:   Following lower endoscopy (colonoscopy or flexible sigmoidoscopy):  Excessive amounts of blood in the stool  Significant tenderness or worsening of abdominal pains  Swelling of the abdomen that is new, acute  Fever of 100F or higher   For urgent or emergent issues, a gastroenterologist can be reached at any hour by calling [phone]. Do not use MyChart messaging for urgent concerns.    DIET:  We do recommend a small meal at first, but then you may proceed to your regular diet.  Drink plenty of fluids but you should avoid alcoholic beverages for 24 hours.  ACTIVITY:   You should plan to take it easy for the rest of today and you should NOT DRIVE or use heavy machinery until tomorrow (because of the sedation medicines used during the test).    FOLLOW UP: Our staff will call the number listed on your records 48-72 hours following your procedure to check on you and address any questions or concerns that you may have regarding the information given to you following your procedure. If we do not reach you, we will leave a message.  We will attempt to reach you two times.  During this call, we will ask if you have developed any symptoms of COVID 19. If you develop any symptoms (ie: fever, flu-like symptoms, shortness of breath, cough etc.) before then, please call [phone].  If you test positive for Covid 19 in the 2 weeks post procedure, please call and report this information to [place].    If any biopsies were taken you will be contacted by phone or by letter within the next 1-3 weeks.  Please call us at [phone] if you have not heard about the biopsies in 3 weeks.    SIGNATURES/CONFIDENTIALITY: You and/or your care partner have signed paperwork which will be entered into your electronic medical record.  These signatures attest to the fact that that the information above on your After Visit Summary has been reviewed and is understood.  Full responsibility of the confidentiality of this discharge information lies with you and/or your care-partner.

## 2020-01-23 NOTE — Progress Notes (Signed)
Called to room to assist during endoscopic procedure.  Patient ID and intended procedure confirmed with present staff. Received instructions for my participation in the procedure from the performing physician.  

## 2020-01-27 ENCOUNTER — Ambulatory Visit: Payer: No Typology Code available for payment source | Admitting: Family Medicine

## 2020-01-27 ENCOUNTER — Telehealth: Payer: Self-pay

## 2020-01-27 NOTE — Telephone Encounter (Signed)
  Follow up Call-  Call back number 01/23/2020  Post procedure Call Back phone  # [phone]  Permission to leave phone message Yes  Some recent data might be hidden     Patient questions:  Do you have a fever, pain , or abdominal swelling? No. Pain Score  0 *  Have you tolerated food without any problems? Yes.    Have you been able to return to your normal activities? Yes.    Do you have any questions about your discharge instructions: Diet   No. Medications  No. Follow up visit  No.  Do you have questions or concerns about your Care? No.  Actions: * If pain score is 4 or above: No action needed, pain <4.    1. Have you developed a fever since your procedure? no  2.   Have you had an respiratory symptoms (SOB or cough) since your procedure? no  3.   Have you tested positive for COVID 19 since your procedure no  4.   Have you had any family members/close contacts diagnosed with the COVID 19 since your procedure?  no   If yes to any of these questions please route to Joylene John, RN and Erenest Rasher, RN

## 2020-01-28 ENCOUNTER — Encounter: Payer: Self-pay | Admitting: Gastroenterology

## 2020-02-03 ENCOUNTER — Telehealth: Payer: Self-pay | Admitting: Family Medicine

## 2020-02-03 ENCOUNTER — Encounter: Payer: Self-pay | Admitting: Family Medicine

## 2020-02-03 NOTE — Telephone Encounter (Signed)
Caller: Samantha Holden  Call Back # [phone]  Patient states that she is having leg cramps throughout the night. Patient would a recommendation for supplement or something for pain until she can get in to see provider.   Please Advise

## 2020-02-06 ENCOUNTER — Ambulatory Visit: Payer: No Typology Code available for payment source | Admitting: Family Medicine

## 2020-02-06 ENCOUNTER — Other Ambulatory Visit: Payer: Self-pay

## 2020-02-06 VITALS — BP 114/70 | HR 90 | Temp 97.7°F | Resp 17 | Ht 72.0 in | Wt 293.0 lb

## 2020-02-06 DIAGNOSIS — R252 Cramp and spasm: Secondary | ICD-10-CM | POA: Diagnosis not present

## 2020-02-06 DIAGNOSIS — N62 Hypertrophy of breast: Secondary | ICD-10-CM

## 2020-02-06 MED ORDER — CYCLOBENZAPRINE HCL 10 MG PO TABS: 10.0000 mg | ORAL_TABLET | Freq: Two times a day (BID) | ORAL | 0 refills | Status: DC | PRN

## 2020-02-06 NOTE — Patient Instructions (Signed)
It was great to see you again today, I will be in touch with your labs soon as possible.  We will look for any apparent explanation for your leg cramping.  In the meantime, please try taking a Flexeril before bedtime for the next week or 2.  Let me know if this is helpful for you
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
## 2020-02-07 ENCOUNTER — Encounter: Payer: Self-pay | Admitting: Family Medicine

## 2020-02-07 LAB — CBC
HCT: 34 % — ABNORMAL LOW (ref 36.0–46.0)
Hemoglobin: 10.8 g/dL — ABNORMAL LOW (ref 12.0–15.0)
MCHC: 31.6 g/dL (ref 30.0–36.0)
MCV: 76.5 fl — ABNORMAL LOW (ref 78.0–100.0)
Platelets: 296 10*3/uL (ref 150.0–400.0)
RBC: 4.45 Mil/uL (ref 3.87–5.11)
RDW: 17.4 % — ABNORMAL HIGH (ref 11.5–15.5)
WBC: 5.2 10*3/uL (ref 4.0–10.5)

## 2020-02-07 LAB — COMPREHENSIVE METABOLIC PANEL
ALT: 11 U/L (ref 0–35)
AST: 16 U/L (ref 0–37)
Albumin: 3.8 g/dL (ref 3.5–5.2)
Alkaline Phosphatase: 97 U/L (ref 39–117)
BUN: 21 mg/dL (ref 6–23)
CO2: 27 mEq/L (ref 19–32)
Calcium: 9.2 mg/dL (ref 8.4–10.5)
Chloride: 102 mEq/L (ref 96–112)
Creatinine, Ser: 0.91 mg/dL (ref 0.40–1.20)
GFR: 79.12 mL/min (ref >60.00)
Glucose, Bld: 101 mg/dL — ABNORMAL HIGH (ref 70–99)
Potassium: 4 mEq/L (ref 3.5–5.1)
Sodium: 137 mEq/L (ref 135–145)
Total Bilirubin: 0.4 mg/dL (ref 0.2–1.2)
Total Protein: 7.3 g/dL (ref 6.0–8.3)

## 2020-02-07 LAB — MAGNESIUM: Magnesium: 1.9 mg/dL (ref 1.5–2.5)

## 2020-02-07 LAB — CK: Total CK: 208 U/L — ABNORMAL HIGH (ref 7–177)

## 2020-02-07 LAB — TSH: TSH: 1.38 u[IU]/mL (ref 0.35–4.50)

## 2020-02-07 LAB — FERRITIN: Ferritin: 36.3 ng/mL (ref 10.0–291.0)

## 2020-02-29 ENCOUNTER — Encounter (HOSPITAL_BASED_OUTPATIENT_CLINIC_OR_DEPARTMENT_OTHER): Payer: Self-pay | Admitting: Emergency Medicine

## 2020-02-29 ENCOUNTER — Other Ambulatory Visit: Payer: Self-pay

## 2020-02-29 ENCOUNTER — Emergency Department (HOSPITAL_BASED_OUTPATIENT_CLINIC_OR_DEPARTMENT_OTHER)
Admission: EM | Admit: 2020-02-29 | Discharge: 2020-02-29 | Disposition: A | Discharge status: Home / Self Care | Payer: No Typology Code available for payment source | Attending: Emergency Medicine | Admitting: Emergency Medicine

## 2020-02-29 DIAGNOSIS — M79662 Pain in left lower leg: Secondary | ICD-10-CM | POA: Insufficient documentation

## 2020-02-29 DIAGNOSIS — M79605 Pain in left leg: Secondary | ICD-10-CM

## 2020-02-29 MED ORDER — PREDNISONE 10 MG (21) PO TBPK
ORAL_TABLET | Freq: Every day | ORAL | 0 refills | Status: DC
Start: 2020-02-29 — End: 2021-02-25

## 2020-02-29 MED ORDER — PREDNISONE 10 MG (21) PO TBPK
ORAL_TABLET | Freq: Every day | ORAL | 0 refills | Status: DC
Start: 2020-02-29 — End: 2020-02-29

## 2020-02-29 NOTE — ED Triage Notes (Signed)
L leg pain from knee to ankle. Denies injury

## 2020-02-29 NOTE — ED Notes (Signed)
Spoke with radiology [id number] outpt [place] study; they will schedule with pt prior to d/c

## 2020-02-29 NOTE — ED Provider Notes (Signed)
[place] [facility] EMERGENCY DEPARTMENT Provider Note   CSN: [id number] Arrival date & time: 02/29/20  1327     History Chief Complaint  Patient presents with  . Leg Pain    Samantha Holden is a 49 y.o. female.  She has no significant past medical history.  Complaining of a weeks worth of left lower leg pain from the knee to the ankle.  Pain at rest and also with activity.  Seems to be more residing in her calf and specifically in her ankle or knee joint.  No numbness or weakness.  Nothing makes it better.  She said she was here a few months ago for same.  No chest pain shortness of breath fevers chills cough.  No known injury.  The history is provided by the patient.  Leg Pain Location:  Leg Leg location:  L lower leg Pain details:    Quality:  Aching   Radiates to:  Does not radiate   Severity:  Moderate   Onset quality:  Gradual   Duration:  1 week   Timing:  Constant   Progression:  Unchanged Chronicity:  Recurrent Dislocation: no   Foreign body present:  No foreign bodies Relieved by:  Nothing Worsened by:  Bearing weight Ineffective treatments:  Muscle relaxant Associated symptoms: no back pain, no decreased ROM, no fever, no numbness and no tingling        Past Medical History:  Diagnosis Date  . Allergy   . Chicken pox     Patient Active Problem List   Diagnosis Date Noted  . Impingement syndrome of left ankle 12/30/2019  . Prediabetes 12/23/2019  . Right ankle pain 02/16/2018    Past Surgical History:  Procedure Laterality Date  . DILATION AND CURETTAGE, DIAGNOSTIC / THERAPEUTIC  1992  . WISDOM TOOTH EXTRACTION       OB History   No obstetric history on file.     Family History  Problem Relation Age of Onset  . Breast cancer Mother   . Arthritis Mother   . Hypertension Mother   . Colon polyps Mother   . Cancer Father   . Bladder Cancer Father   . Heart attack Father   . Arthritis Maternal Grandmother   . Heart disease Maternal  Grandfather   . Arthritis Maternal Grandfather   . Heart attack Maternal Grandfather   . Hypertension Maternal Grandfather   . Colon cancer Neg Hx   . Esophageal cancer Neg Hx   . Stomach cancer Neg Hx   . Rectal cancer Neg Hx     Social History   Tobacco Use  . Smoking status: Never Smoker  . Smokeless tobacco: Never Used  Vaping Use  . Vaping Use: Never used  Substance Use Topics  . Alcohol use: No  . Drug use: Never    Home Medications Prior to Admission medications   Medication Sig Start Date End Date Taking? Authorizing Provider  cyclobenzaprine (FLEXERIL) 10 MG tablet Take 1 tablet (10 mg total) by mouth 2 (two) times daily as needed for muscle spasms. 02/06/20   Copland, Gay Filler, MD  meloxicam (MOBIC) 15 MG tablet Take 1 tablet (15 mg total) by mouth daily. 01/22/20   Edrick Kins, DPM    Allergies    Patient has no known allergies.  Review of Systems   Review of Systems  Constitutional: Negative for fever.  Musculoskeletal: Negative for back pain.  Skin: Negative for wound.  Neurological: Negative for weakness and numbness.  Physical Exam Updated Vital Signs BP (!) 122/91 (BP Location: Right Arm)   Pulse 94   Temp 98.4 F (36.9 C) (Oral)   Resp 18   Ht 6' (1.829 m)   Wt 131.5 kg   SpO2 99%   BMI 39.33 kg/m   Physical Exam Constitutional:      Appearance: She is well-developed.  HENT:     Head: Normocephalic and atraumatic.  Eyes:     Conjunctiva/sclera: Conjunctivae normal.  Musculoskeletal:        General: Tenderness present. No deformity. Normal range of motion.     Cervical back: Neck supple.     Right lower leg: No edema.     Left lower leg: No edema.     Comments: She has full range of motion of her left knee and ankle.  There are some diffuse tenderness behind the knee and in the proximal calf.  Do not feel any gross tears or hematoma.  No cords or edema.  Distal neurovascular intact with strong pulses.  Skin:    General: Skin is  warm and dry.     Capillary Refill: Capillary refill takes less than 2 seconds.  Neurological:     General: No focal deficit present.     Mental Status: She is alert.     GCS: GCS eye subscore is 4. GCS verbal subscore is 5. GCS motor subscore is 6.     Sensory: No sensory deficit.     Motor: No weakness.     Gait: Gait abnormal (antalgic).     ED Results / Procedures / Treatments   Labs (all labs ordered are listed, but only abnormal results are displayed) Labs Reviewed - No data to display  EKG None  Radiology No results found.  Procedures Procedures (including critical care time)  Medications Ordered in ED Medications - No data to display  ED Course  I have reviewed the triage vital signs and the nursing notes.  Pertinent labs & imaging results that were available during my care of the patient were reviewed by me and considered in my medical decision making (see chart for details).    MDM Rules/Calculators/A&P                         Differential includes muscle tear, arthritis, septic joint, DVT, Baker's cyst, vascular.  Her joint exam is fairly benign in range of motion without any significant pain.  Doubt septic joint.  No significant warmth or effusions.  She is tender through the calf.  Her extensor mechanism and Achilles are intact.  Unfortunately duplex is not available today so we will set her up for tomorrow if she wishes.  Reviewed prior ED visit 2 months ago for similar presentation.  At that time she was duplex negative and placed on prednisone with good relief.  She is agreeable to trial that.  Return instructions discussed  Final Clinical Impression(s) / ED Diagnoses Final diagnoses:  Left leg pain    Rx / DC Orders ED Discharge Orders         Ordered    predniSONE (STERAPRED UNI-PAK 21 TAB) 10 MG (21) TBPK tablet  Daily,   Status:  Discontinued     Reprint     02/29/20 1521    predniSONE (STERAPRED UNI-PAK 21 TAB) 10 MG (21) TBPK tablet  Daily      Discontinue  Reprint     02/29/20 1525    US Venous Img  Lower Unilateral Left     Discontinue     02/29/20 1521           Hayden Rasmussen, MD 03/01/20 1025

## 2020-02-29 NOTE — Discharge Instructions (Signed)
You were seen in the emergency department for evaluation of left leg pain.  Your symptoms seem to be mostly in your calf muscle.  We are ordering you an ultrasound for tomorrow as they are not available at this time.  We are also prescribing prednisone as this is worked in the past.  Please do gentle stretching can use ice or heat to the affected area.  Follow-up with your doctor and return to the emergency department for any worsening or concerning symptoms

## 2020-03-01 ENCOUNTER — Ambulatory Visit (HOSPITAL_BASED_OUTPATIENT_CLINIC_OR_DEPARTMENT_OTHER)
Admission: RE | Admit: 2020-03-01 | Discharge: 2020-03-01 | Disposition: A | Discharge status: Home / Self Care | Payer: No Typology Code available for payment source | Source: Ambulatory Visit | Attending: Emergency Medicine | Admitting: Emergency Medicine

## 2020-03-01 DIAGNOSIS — M79605 Pain in left leg: Secondary | ICD-10-CM | POA: Insufficient documentation

## 2020-03-01 IMAGING — US US EXTREM LOW VENOUS*L*
1 series · 13 of 24 positions shown · non-contrast
Comparison: None.

CLINICAL DATA: 49-year-old female with a history of lower extremity
pain



[Series 1: us extrem low venous*left* · 13 of 33 slices shown]
[im 1/33]
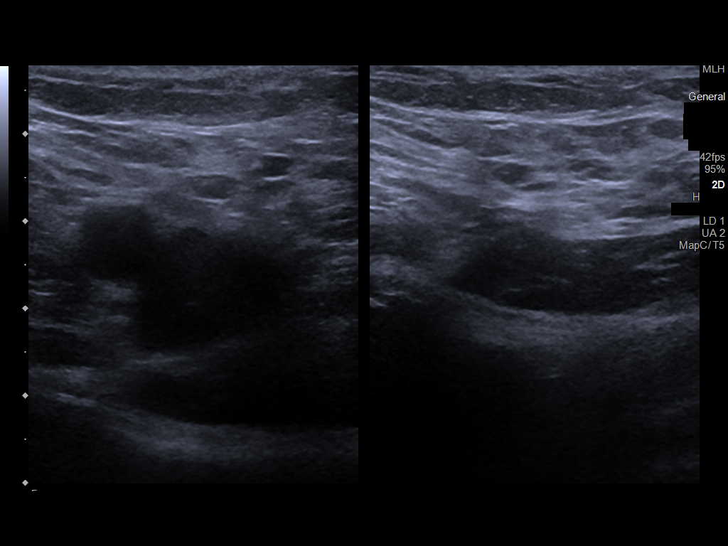
[im 3/33]
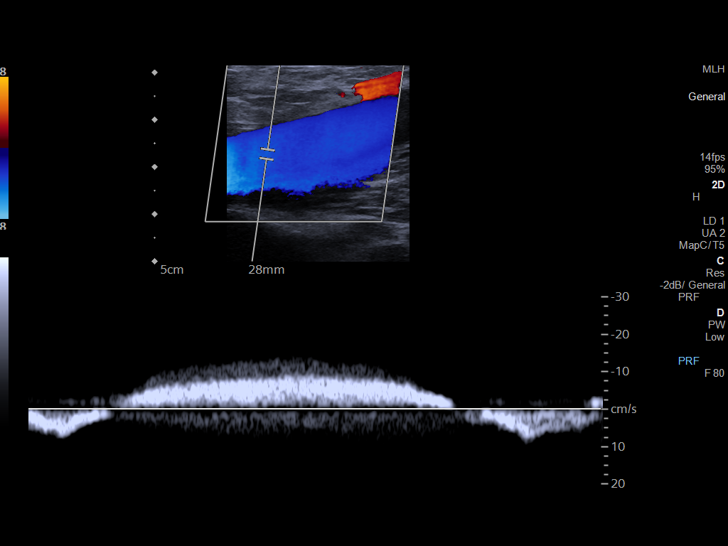
[im 6/33]
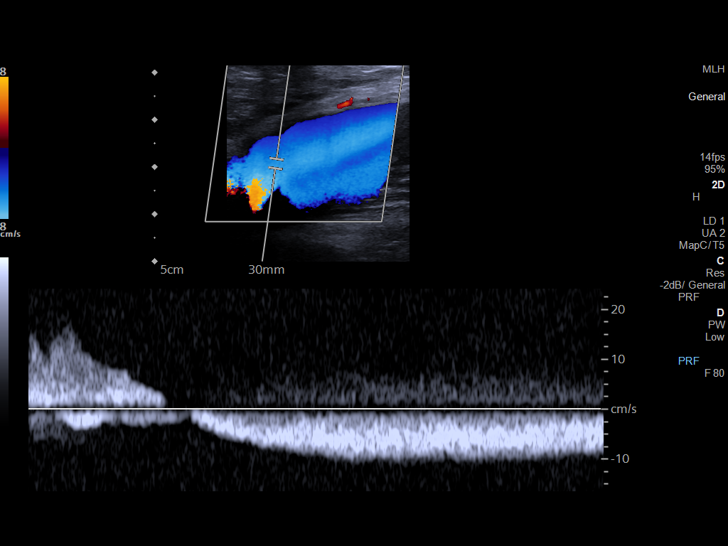
[im 9/33]
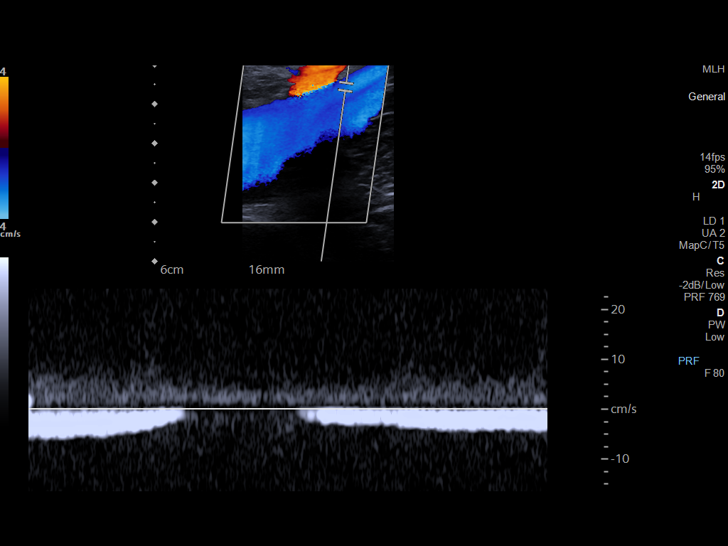
[im 12/33]
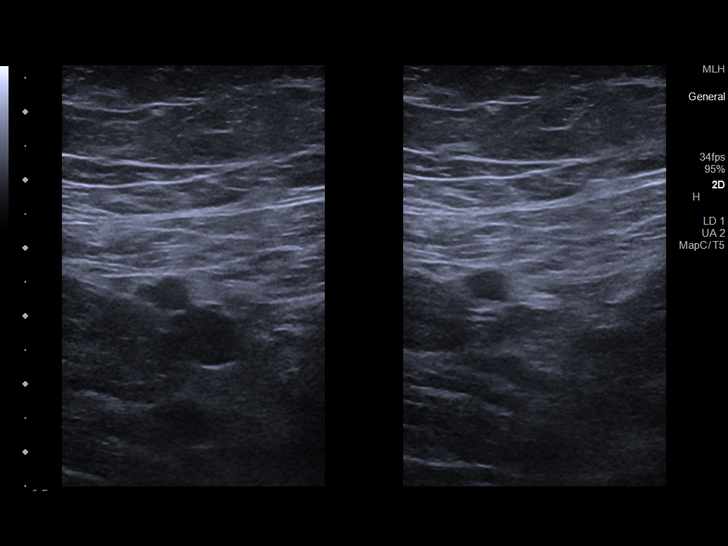
[im 14/33]
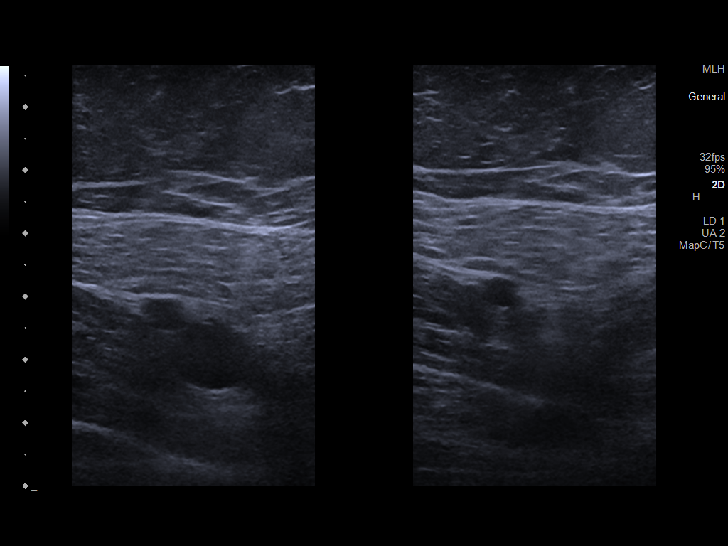
[im 17/33]
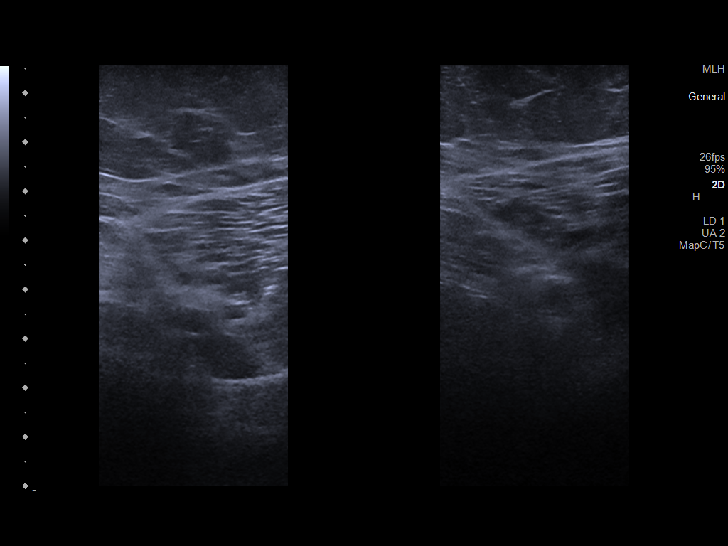
[im 19/33]
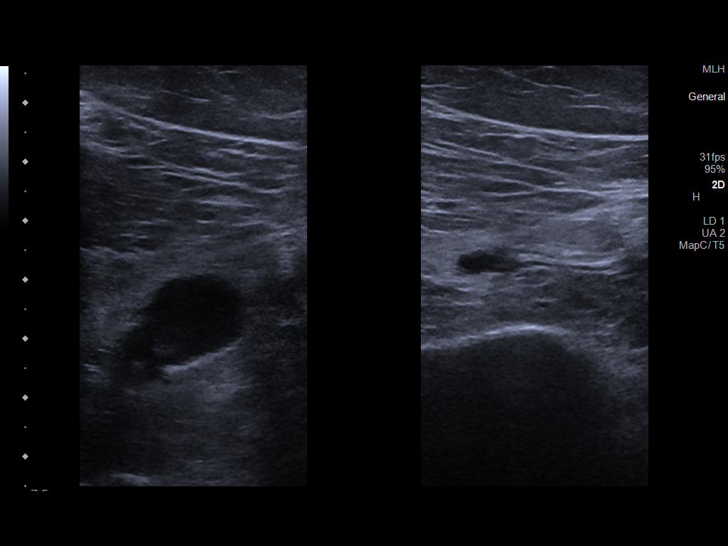
[im 21/33]
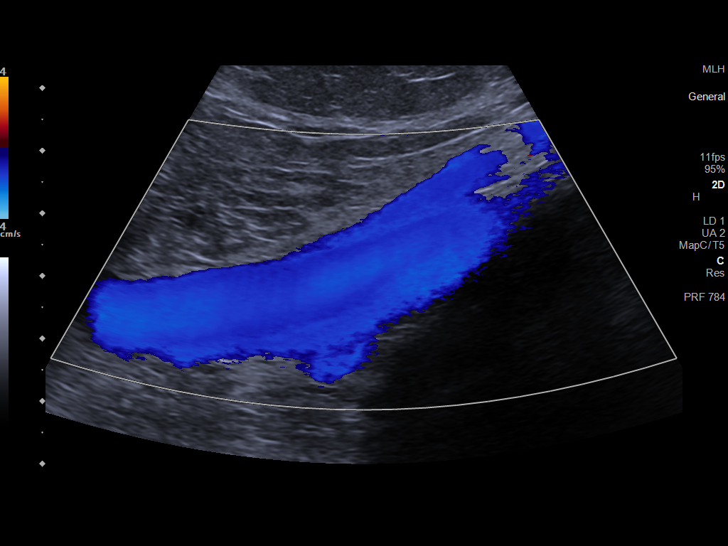
[im 24/33]
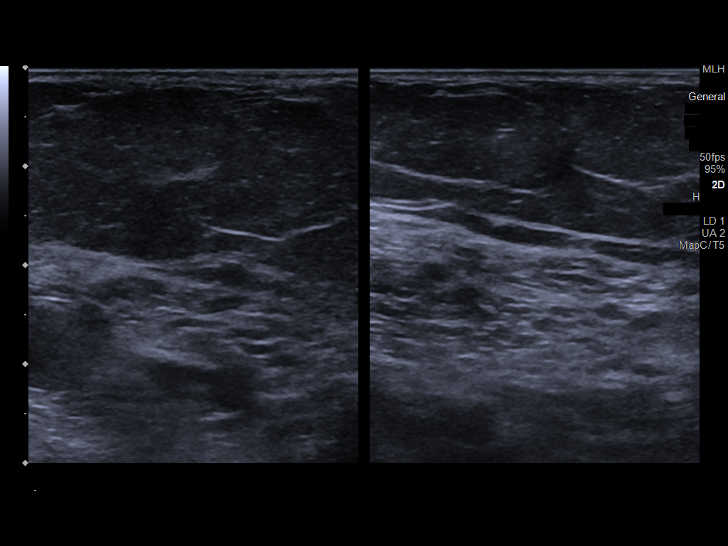
[im 27/33]
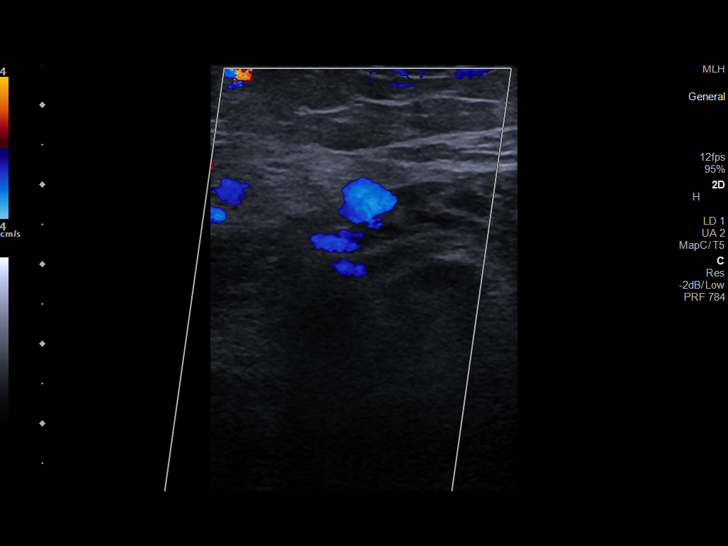
[im 30/33]
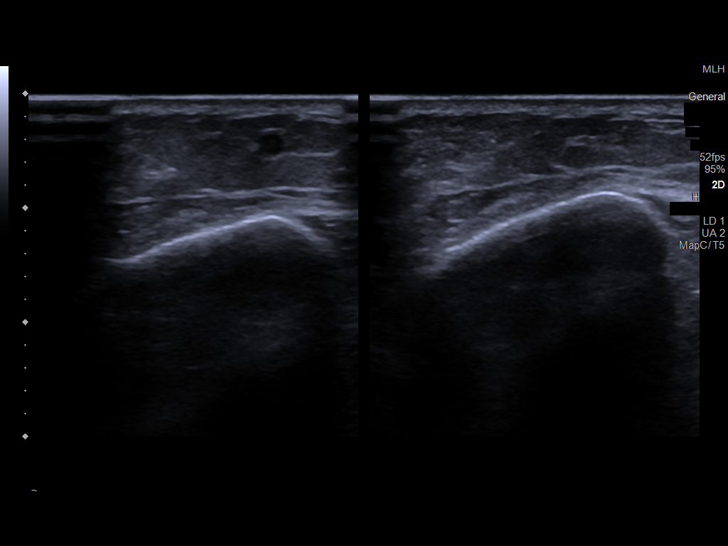
[im 33/33]
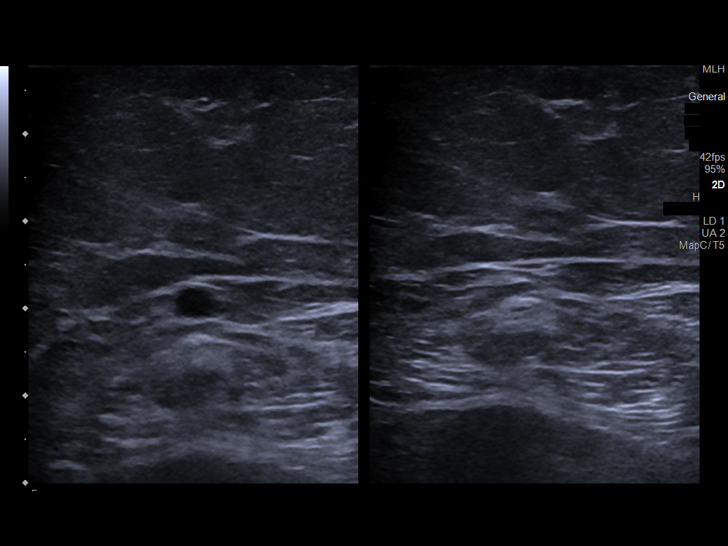

[13 of 24 positions shown; findings below may reference images not displayed]

FINDINGS: Contralateral Common Femoral Vein: Respiratory phasicity is normal
and symmetric with the symptomatic side. No evidence of thrombus.
Normal compressibility.

Common Femoral Vein: No evidence of thrombus. Normal
compressibility, respiratory phasicity and response to augmentation.

Saphenofemoral Junction: No evidence of thrombus. Normal
compressibility and flow on color Doppler imaging.

Profunda Femoral Vein: No evidence of thrombus. Normal
compressibility and flow on color Doppler imaging.

Femoral Vein: No evidence of thrombus. Normal compressibility,
respiratory phasicity and response to augmentation.

Popliteal Vein: No evidence of thrombus. Normal compressibility,
respiratory phasicity and response to augmentation.

Calf Veins: No thrombus within the posterior tibial vein which is
compressible. Peroneal vein not visualized.

Superficial Great Saphenous Vein: No evidence of thrombus. Normal
compressibility and flow on color Doppler imaging.

Other Findings:  None.
IMPRESSION: Sonographic survey of the left lower extremity negative for DVT

## 2020-03-14 ENCOUNTER — Other Ambulatory Visit: Payer: Self-pay | Admitting: Podiatry

## 2020-04-28 ENCOUNTER — Other Ambulatory Visit: Payer: Self-pay

## 2020-04-28 ENCOUNTER — Encounter: Payer: Self-pay | Admitting: Plastic Surgery

## 2020-04-28 ENCOUNTER — Ambulatory Visit (INDEPENDENT_AMBULATORY_CARE_PROVIDER_SITE_OTHER): Payer: No Typology Code available for payment source | Admitting: Plastic Surgery

## 2020-04-28 DIAGNOSIS — M25511 Pain in right shoulder: Secondary | ICD-10-CM

## 2020-04-28 DIAGNOSIS — G8929 Other chronic pain: Secondary | ICD-10-CM | POA: Diagnosis not present

## 2020-04-28 DIAGNOSIS — M546 Pain in thoracic spine: Secondary | ICD-10-CM

## 2020-04-28 DIAGNOSIS — M549 Dorsalgia, unspecified: Secondary | ICD-10-CM | POA: Insufficient documentation

## 2020-04-28 DIAGNOSIS — N62 Hypertrophy of breast: Secondary | ICD-10-CM | POA: Diagnosis not present

## 2020-04-28 DIAGNOSIS — M25519 Pain in unspecified shoulder: Secondary | ICD-10-CM | POA: Insufficient documentation

## 2020-04-28 NOTE — Progress Notes (Signed)
Patient ID: Samantha Holden, female    DOB: May 01, 1971, 49 y.o.   MRN: [id number]   Chief Complaint  Patient presents with  . Consult    Mammary Hyperplasia: The patient is a 49 y.o. female with a history of mammary hyperplasia for several years.  She has extremely large breasts causing symptoms that include the following: Back pain in the upper and lower back, including neck pain. She pulls or pins her bra straps to provide better lift and relief of the pressure and pain. She notices relief by holding her breast up manually.  Her shoulder straps cause grooves and pain and pressure that requires padding for relief. Pain medication is sometimes required with motrin and tylenol.  Activities that are hindered by enlarged breasts include: exercise and running.  She has tried supportive clothing as well as fitted bras without improvement.  She has severe grade 3 ptosis bilaterally  Her breasts are extremely large and fairly symmetric with the left slightly longer than the right.  She has hyperpigmentation of the inframammary area on both sides.  The sternal to nipple distance on the right is 45 cm and the left is 46 cm.  The IMF distance is 20 cm.  She is 6 feet 0 inches tall and weighs 285 pounds.  Preoperative bra size = 46 G cup.  She would like to be a B/C cup.  The estimated excess breast tissue to be removed at the time of surgery = 1600 grams on the left and 1600 grams on the right.  Mammogram history: April 2021 and negative.  Family history of breast cancer:  mother.  Tobacco use:  None and no Diabetes.  She has not had physical therapy in the past but is willing to have some now.  She also has some restriction on her range of motion on her right shoulder.  She contributes it to the weight of her breasts.  May be the cause but there may be also something going on with the shoulder so I will reevaluate her when she has finished her physical therapy.   Review of Systems  Constitutional:  Positive for activity change. Negative for appetite change.  HENT: Negative.   Eyes: Negative.   Respiratory: Negative.   Cardiovascular: Negative.  Negative for leg swelling.  Gastrointestinal: Negative.  Negative for abdominal distention and abdominal pain.  Endocrine: Negative.   Genitourinary: Negative.   Musculoskeletal: Positive for back pain and neck pain.  Neurological: Negative.   Hematological: Negative.   Psychiatric/Behavioral: Negative.     Past Medical History:  Diagnosis Date  . Allergy   . Chicken pox     Past Surgical History:  Procedure Laterality Date  . DILATION AND CURETTAGE, DIAGNOSTIC / THERAPEUTIC  1992  . WISDOM TOOTH EXTRACTION        Current Outpatient Medications:  .  cyclobenzaprine (FLEXERIL) 10 MG tablet, Take 1 tablet (10 mg total) by mouth 2 (two) times daily as needed for muscle spasms., Disp: 30 tablet, Rfl: 0 .  meloxicam (MOBIC) 15 MG tablet, TAKE 1 TABLET BY MOUTH EVERY DAY, Disp: 30 tablet, Rfl: 1 .  predniSONE (STERAPRED UNI-PAK 21 TAB) 10 MG (21) TBPK tablet, Take by mouth daily. Take 6 tabs by mouth daily  for 2 days, then 5 tabs for 2 days, then 4 tabs for 2 days, then 3 tabs for 2 days, 2 tabs for 2 days, then 1 tab by mouth daily for 2 days, Disp: 42 tablet, Rfl:  0   Objective:   Vitals:   04/28/20 1448  BP: 116/76  Pulse: 81  Temp: 98.3 F (36.8 C)  SpO2: 100%    Physical Exam Vitals and nursing note reviewed.  Constitutional:      Appearance: Normal appearance.  HENT:     Head: Normocephalic and atraumatic.  Eyes:     Extraocular Movements: Extraocular movements intact.  Cardiovascular:     Rate and Rhythm: Normal rate.     Pulses: Normal pulses.  Pulmonary:     Effort: Pulmonary effort is normal. No respiratory distress.  Abdominal:     General: Abdomen is flat. There is no distension.     Palpations: There is no mass.     Tenderness: There is no abdominal tenderness.  Skin:    General: Skin is warm.      Capillary Refill: Capillary refill takes less than 2 seconds.  Neurological:     General: No focal deficit present.     Mental Status: She is alert and oriented to person, place, and time.  Psychiatric:        Mood and Affect: Mood normal.        Behavior: Behavior normal.     Assessment & Plan:  Symptomatic mammary hypertrophy  Chronic right shoulder pain  Chronic bilateral thoracic back pain  The patient is a very good candidate for breast reduction.  I would also add lateral liposuction.  She will most likely need an inferior pedicle technique or a free nipple graft due to her length.  We will set up physical therapy. I will see the patient back after the physical therapy.  Patient understands that she has a high risk of loss of nipple areola sensation and loss of her nipple areola.  She understanding and agreeing with the plan.  Pictures were obtained of the patient and placed in the chart with the patient's or guardian's permission.    Ephraim, DO

## 2020-05-14 ENCOUNTER — Emergency Department (HOSPITAL_BASED_OUTPATIENT_CLINIC_OR_DEPARTMENT_OTHER)
Admission: EM | Admit: 2020-05-14 | Discharge: 2020-05-14 | Disposition: A | Discharge status: Home / Self Care | Payer: No Typology Code available for payment source | Attending: Emergency Medicine | Admitting: Emergency Medicine

## 2020-05-14 ENCOUNTER — Other Ambulatory Visit: Payer: Self-pay

## 2020-05-14 ENCOUNTER — Encounter (HOSPITAL_BASED_OUTPATIENT_CLINIC_OR_DEPARTMENT_OTHER): Payer: Self-pay | Admitting: *Deleted

## 2020-05-14 DIAGNOSIS — M542 Cervicalgia: Secondary | ICD-10-CM | POA: Diagnosis not present

## 2020-05-14 DIAGNOSIS — M25511 Pain in right shoulder: Secondary | ICD-10-CM | POA: Diagnosis not present

## 2020-05-14 DIAGNOSIS — Z79899 Other long term (current) drug therapy: Secondary | ICD-10-CM | POA: Insufficient documentation

## 2020-05-14 MED ORDER — LIDOCAINE-EPINEPHRINE 2 %-1:100000 IJ SOLN
INTRAMUSCULAR | Status: AC
  Filled 2020-05-14: qty 1.7

## 2020-05-14 MED ORDER — LIDOCAINE-EPINEPHRINE 2 %-1:100000 IJ SOLN: 20.0000 mL | Freq: Once | INTRAMUSCULAR | Status: DC

## 2020-05-14 MED ORDER — LIDOCAINE-EPINEPHRINE 1 %-1:100000 IJ SOLN
INTRAMUSCULAR | Status: AC
  Administered 2020-05-14: 1 mL
  Filled 2020-05-14: qty 1

## 2020-05-14 NOTE — ED Triage Notes (Signed)
Stabbing pain in her right shoulder and neck since last night. Limited movement of her head. Denies past back problems. No fever.

## 2020-05-14 NOTE — Discharge Instructions (Signed)
Contact a health care provider if: Your pain comes back, and it is worse than before the injection. You may need more injections. You have chills or a fever. The injection site becomes more painful, red, swollen, or warm to the touch. 

## 2020-05-14 NOTE — ED Provider Notes (Signed)
[facility] Provider Note   CSN: [id number] Arrival date & time: 05/14/20  1132     History Chief Complaint  Patient presents with  . Muscle Pain    Samantha Holden is a 49 y.o. female.  With a past medical history of chronic upper back pain and symptomatic mammary hypertrophy who presents emergency department with chief complaint of right shoulder and neck pain.  Patient states that it began in her upper back between her shoulder blade and spine yesterday.  The pain has progressively worsened.  Today she was having so much pain she had to leave work.  The pain is worse when she tries to lean her head down or sit upright.  She has had issues like this in the past but states that this is really gripping and uncomfortable.  She denies any chest pain or shortness of breath.  She tried Motrin and a heat pack without relief of her symptoms. HPI     Past Medical History:  Diagnosis Date  . Allergy   . Chicken pox     Patient Active Problem List   Diagnosis Date Noted  . Symptomatic mammary hypertrophy 04/28/2020  . Shoulder pain 04/28/2020  . Back pain 04/28/2020  . Impingement syndrome of left ankle 12/30/2019  . Prediabetes 12/23/2019  . Right ankle pain 02/16/2018    Past Surgical History:  Procedure Laterality Date  . DILATION AND CURETTAGE, DIAGNOSTIC / THERAPEUTIC  1992  . WISDOM TOOTH EXTRACTION       OB History   No obstetric history on file.     Family History  Problem Relation Age of Onset  . Breast cancer Mother   . Arthritis Mother   . Hypertension Mother   . Colon polyps Mother   . Cancer Father   . Bladder Cancer Father   . Heart attack Father   . Arthritis Maternal Grandmother   . Heart disease Maternal Grandfather   . Arthritis Maternal Grandfather   . Heart attack Maternal Grandfather   . Hypertension Maternal Grandfather   . Colon cancer Neg Hx   . Esophageal cancer Neg Hx   . Stomach cancer Neg Hx   . Rectal  cancer Neg Hx     Social History   Tobacco Use  . Smoking status: Never Smoker  . Smokeless tobacco: Never Used  Vaping Use  . Vaping Use: Never used  Substance Use Topics  . Alcohol use: No  . Drug use: Never    Home Medications Prior to Admission medications   Medication Sig Start Date End Date Taking? Authorizing Provider  cyclobenzaprine (FLEXERIL) 10 MG tablet Take 1 tablet (10 mg total) by mouth 2 (two) times daily as needed for muscle spasms. 02/06/20   Copland, Gay Filler, MD  meloxicam (MOBIC) 15 MG tablet TAKE 1 TABLET BY MOUTH EVERY DAY 03/17/20   Edrick Kins, DPM  predniSONE (STERAPRED UNI-PAK 21 TAB) 10 MG (21) TBPK tablet Take by mouth daily. Take 6 tabs by mouth daily  for 2 days, then 5 tabs for 2 days, then 4 tabs for 2 days, then 3 tabs for 2 days, 2 tabs for 2 days, then 1 tab by mouth daily for 2 days 02/29/20   Hayden Rasmussen, MD    Allergies    Patient has no known allergies.  Review of Systems   Review of Systems Ten systems reviewed and are negative for acute change, except as noted in the HPI.  Physical Exam Updated Vital Signs BP 125/90 (BP Location: Left Arm)   Pulse 80   Temp 98.3 F (36.8 C) (Oral)   Resp 14   Ht 6' (1.829 m)   Wt 129.3 kg   SpO2 100%   BMI 38.66 kg/m   Physical Exam Vitals and nursing note reviewed.  Constitutional:      General: She is not in acute distress.    Appearance: She is well-developed. She is not diaphoretic.  HENT:     Head: Normocephalic and atraumatic.  Eyes:     General: No scleral icterus.    Conjunctiva/sclera: Conjunctivae normal.  Cardiovascular:     Rate and Rhythm: Normal rate and regular rhythm.     Heart sounds: Normal heart sounds. No murmur heard.  No friction rub. No gallop.   Pulmonary:     Effort: Pulmonary effort is normal. No respiratory distress.     Breath sounds: Normal breath sounds.  Chest:     Comments: Very large breasts (46G)  Deep shoulder grooves from bra  strap Abdominal:     General: Bowel sounds are normal. There is no distension.     Palpations: Abdomen is soft. There is no mass.     Tenderness: There is no abdominal tenderness. There is no guarding.  Musculoskeletal:     Cervical back: Normal range of motion.     Thoracic back: Spasms and tenderness present.       Back:  Skin:    General: Skin is warm and dry.  Neurological:     Mental Status: She is alert and oriented to person, place, and time.  Psychiatric:        Behavior: Behavior normal.     ED Results / Procedures / Treatments   Labs (all labs ordered are listed, but only abnormal results are displayed) Labs Reviewed - No data to display  EKG None  Radiology No results found.  Procedures Procedures (including critical care time)   Procedure Note: Trigger Point Injection for Myofascial pain  Performed by Margarita Mail, PA-C  Indication: muscle/myofascial pain Muscle body and of the right Trapezius, serratus Posterior Superior, and splenius cervicus muscle(s) were injected with 1% lidocaine under sterile technique for release of muscle spasm/pain. Patient tolerated well with immediate improvement of symptoms and no immediate complications following procedure.  CPT Code:   1 or 2 muscle bodies: 20552 3 or more: 20553     Medications Ordered in ED Medications  lidocaine-EPINEPHrine (XYLOCAINE W/EPI) 2 %-1:100000 (with pres) injection 20 mL (has no administration in time range)    ED Course  I have reviewed the triage vital signs and the nursing notes.  Pertinent labs & imaging results that were available during my care of the patient were reviewed by me and considered in my medical decision making (see chart for details).    MDM Rules/Calculators/A&P                         Patient with active trigger point of the right shoulder blade region.  Patient with significant pain improvement after trigger point injection.  Pain from 10 out of 10 down to 4  out of 10 with markedly increased range of motion of the neck and upper shoulder region.  Patient will be discharged to continue her anti-inflammatory medications.  I have advised patient to use lidocaine.  She has Flexeril at home.  She may follow-up with her physical therapist and primary care doctor.  She appears otherwise appropriate for discharge at this time. Final Clinical Impression(s) / ED Diagnoses Final diagnoses:  None    Rx / DC Orders ED Discharge Orders    None       Margarita Mail, PA-C 05/14/20 1952    Margette Fast, MD 05/18/20 1226

## 2020-05-17 ENCOUNTER — Other Ambulatory Visit: Payer: Self-pay | Admitting: Podiatry

## 2020-05-28 ENCOUNTER — Encounter: Payer: Self-pay | Admitting: Family Medicine

## 2020-06-03 ENCOUNTER — Ambulatory Visit: Payer: Self-pay

## 2020-06-03 ENCOUNTER — Ambulatory Visit (INDEPENDENT_AMBULATORY_CARE_PROVIDER_SITE_OTHER): Payer: No Typology Code available for payment source | Admitting: Orthopaedic Surgery

## 2020-06-03 ENCOUNTER — Encounter: Payer: Self-pay | Admitting: Orthopaedic Surgery

## 2020-06-03 DIAGNOSIS — G8929 Other chronic pain: Secondary | ICD-10-CM | POA: Diagnosis not present

## 2020-06-03 DIAGNOSIS — M25562 Pain in left knee: Secondary | ICD-10-CM | POA: Diagnosis not present

## 2020-06-03 MED ORDER — LIDOCAINE HCL 1 % IJ SOLN
5.0000 mL | INTRAMUSCULAR | Status: AC | PRN
  Administered 2020-06-03: 5 mL

## 2020-06-03 MED ORDER — METHYLPREDNISOLONE ACETATE 40 MG/ML IJ SUSP
40.0000 mg | INTRAMUSCULAR | Status: AC | PRN
  Administered 2020-06-03: 40 mg via INTRA_ARTICULAR

## 2020-06-03 NOTE — Progress Notes (Addendum)
Office Visit Note   Patient: Samantha Holden           Date of Birth: Dec 17, 1970           MRN: [id number] Visit Date: 06/03/2020              Requested by: Darreld Mclean, MD [address] [place],  [place] [address] PCP: Darreld Mclean, MD   Assessment & Plan: Visit Diagnoses:  1. Acute pain of left knee     Plan:  We will have her work on quad strengthening exercises as shown.  See back in 2 weeks to see what type of response she had to the injection.  She continues to have pain or mechanical symptoms in the knee.  Did discuss with her the mechanical symptoms and pain continue and will with most likely obtain an MRI of her left knee to rule out meniscal tear.  Questions were encouraged and answered by Dr. Ninfa Linden and myself.  Don Joy knee brace fitted and received to aid with stability of the knee.  Follow-Up Instructions: Return in about 2 weeks (around 06/17/2020).   Orders:  Orders Placed This Encounter  Procedures  . XR Knee 1-2 Views Left   No orders of the defined types were placed in this encounter.     Procedures: Large Joint Inj: L knee on 06/03/2020 9:24 AM Indications: pain Details: 22 G 1.5 in needle, superolateral approach  Arthrogram: No  Medications: 40 mg methylPREDNISolone acetate 40 MG/ML; 5 mL lidocaine 1 % Aspirate: 2 mL yellow and blood-tinged Outcome: tolerated well, no immediate complications Procedure, treatment alternatives, risks and benefits explained, specific risks discussed. Consent was given by the patient. Immediately prior to procedure a time out was called to verify the correct patient, procedure, equipment, support staff and site/side marked as required. Patient was prepped and draped in the usual sterile fashion.       Clinical Data: No additional findings.   Subjective: Chief Complaint  Patient presents with  . Left Knee - Pain    HPI Samantha Holden is a 49 year old female were seen for the first time for  left knee pain.  She states that about a week ago started having pain in the lateral aspect of the knee and started having some buckling of the knee.  She notes swelling in the knee.  No known injury.  Trouble with ambulation due to the pain.  She cannot walk long distances due to pain.  She has taken Mobic, Tylenol and use icy hot on the knee with some relief.  Review of Systems  Constitutional: Negative for chills and fever.  Musculoskeletal: Positive for arthralgias.     Objective: Vital Signs: There were no vitals taken for this visit.  Physical Exam Constitutional:      Appearance: She is not ill-appearing or diaphoretic.  Pulmonary:     Effort: Pulmonary effort is normal.  Neurological:     Mental Status: She is alert and oriented to person, place, and time.  Psychiatric:        Mood and Affect: Mood normal.     Ortho Exam Bilateral knees no abnormal warmth erythema.  Right knee no effusion.  Left knee edema and effusion.  Good range of motion of both knees without significant pain.  Left knee tenderness along medial lateral joint lines also over the pes anserinus region.  McMurray's is positive on the left.  No instability valgus varus stressing of either knee.  Calf supple nontender. Specialty Comments:  No specialty comments available.  Imaging: XR Knee 1-2 Views Left  Result Date: 06/03/2020 AP lateral views left knee: No acute fracture.  Knee is well located.  Mild arthritic changes involving the medial compartment.  Otherwise knee is well-maintained.  Arthritic changes at the tib-fib joint.    PMFS History: Patient Active Problem List   Diagnosis Date Noted  . Symptomatic mammary hypertrophy 04/28/2020  . Shoulder pain 04/28/2020  . Back pain 04/28/2020  . Impingement syndrome of left ankle 12/30/2019  . Prediabetes 12/23/2019  . Right ankle pain 02/16/2018   Past Medical History:  Diagnosis Date  . Allergy   . Chicken pox     Family History  Problem  Relation Age of Onset  . Breast cancer Mother   . Arthritis Mother   . Hypertension Mother   . Colon polyps Mother   . Cancer Father   . Bladder Cancer Father   . Heart attack Father   . Arthritis Maternal Grandmother   . Heart disease Maternal Grandfather   . Arthritis Maternal Grandfather   . Heart attack Maternal Grandfather   . Hypertension Maternal Grandfather   . Colon cancer Neg Hx   . Esophageal cancer Neg Hx   . Stomach cancer Neg Hx   . Rectal cancer Neg Hx     Past Surgical History:  Procedure Laterality Date  . DILATION AND CURETTAGE, DIAGNOSTIC / THERAPEUTIC  1992  . WISDOM TOOTH EXTRACTION     Social History   Occupational History  . Not on file  Tobacco Use  . Smoking status: Never Smoker  . Smokeless tobacco: Never Used  Vaping Use  . Vaping Use: Never used  Substance and Sexual Activity  . Alcohol use: No  . Drug use: Never  . Sexual activity: Not on file

## 2020-06-17 ENCOUNTER — Ambulatory Visit (INDEPENDENT_AMBULATORY_CARE_PROVIDER_SITE_OTHER): Payer: No Typology Code available for payment source | Admitting: Orthopaedic Surgery

## 2020-06-17 DIAGNOSIS — M25562 Pain in left knee: Secondary | ICD-10-CM | POA: Diagnosis not present

## 2020-06-17 NOTE — Progress Notes (Signed)
Patient is a 49 year old female who returns 2 weeks after Artis Delay placed a steroid injection in her left knee.  She says she is doing much better overall and is not really having a lot of pain in that knee.  Examination of left knee shows that her range of motion is full and the knee is ligamentously stable with great strength.  Since she is doing so well follow-up can be as needed.  I encouraged her to work on weight loss given her weight of 285 pounds.  She will continue quad strengthening exercises as well.  All questions and concerns were answered and addressed.

## 2020-07-14 ENCOUNTER — Other Ambulatory Visit: Payer: Self-pay | Admitting: Podiatry

## 2020-07-15 ENCOUNTER — Ambulatory Visit (INDEPENDENT_AMBULATORY_CARE_PROVIDER_SITE_OTHER): Payer: No Typology Code available for payment source

## 2020-07-15 ENCOUNTER — Other Ambulatory Visit: Payer: Self-pay

## 2020-07-15 ENCOUNTER — Ambulatory Visit (INDEPENDENT_AMBULATORY_CARE_PROVIDER_SITE_OTHER): Payer: No Typology Code available for payment source | Admitting: Podiatry

## 2020-07-15 DIAGNOSIS — M7751 Other enthesopathy of right foot: Secondary | ICD-10-CM | POA: Diagnosis not present

## 2020-07-15 DIAGNOSIS — M19071 Primary osteoarthritis, right ankle and foot: Secondary | ICD-10-CM

## 2020-07-15 MED ORDER — METHYLPREDNISOLONE 4 MG PO TBPK: ORAL_TABLET | ORAL | 0 refills | Status: DC

## 2020-07-15 NOTE — Progress Notes (Signed)
   Subjective:  49 y.o. female presenting today with a new complaint regarding right foot and ankle pain.  Patient states the pain has been going on for approximately 3-4 weeks now.  She has not done anything for treatment.  She does not recall any history of injury.  She continues to wear her old orthotics that are approximately 50 years old.   Past Medical History:  Diagnosis Date  . Allergy   . Chicken pox      Objective / Physical Exam:  General:  The patient is alert and oriented x3 in no acute distress. Dermatology:  Skin is warm, dry and supple bilateral lower extremities. Negative for open lesions or macerations. Vascular:  Palpable pedal pulses bilaterally. No edema or erythema noted. Capillary refill within normal limits. Neurological:  Epicritic and protective threshold grossly intact bilaterally.  Musculoskeletal Exam:  Pain on palpation to the anterior lateral medial aspects of the patient's right ankle. Mild edema noted. Range of motion within normal limits to all pedal and ankle joints bilateral. Muscle strength 5/5 in all groups bilateral.   Radiographic Exam:  Normal osseous mineralization. Joint spaces preserved. No fracture/dislocation/boney destruction.  Pes planus noted on radiographic exam lateral views.  Decreased calcaneal inclination and metatarsal declination angle is noted.  Anterior break in the cyma line noted on lateral views.  Medial talar head deviation noted on AP radiograph.  Joint space narrowing also noted to the ankle  Assessment: 1.  DJD/capsulitis right ankle 2.  Pes planus right  Plan of Care:  1. Patient was evaluated. X-Rays reviewed.  2. Injection of 0.5 mL Celestone Soluspan injected in the patient's right ankle. 3.  Patient declined an ankle brace today.  She is using the athletic sports taping method.  Continue. 4.  Prescription for Medrol Dosepak 5.  Appointment with Pedorthist for new custom molded orthotics 6.  Return to clinic as  needed   Edrick Kins, DPM [facility]  Dr. Edrick Kins, [address]                                        [place], [place] [address]                Office [phone]  Fax [phone]

## 2020-07-28 ENCOUNTER — Other Ambulatory Visit: Payer: Self-pay

## 2020-07-28 ENCOUNTER — Ambulatory Visit (INDEPENDENT_AMBULATORY_CARE_PROVIDER_SITE_OTHER): Payer: No Typology Code available for payment source | Admitting: Orthotics

## 2020-07-28 DIAGNOSIS — M659 Synovitis and tenosynovitis, unspecified: Secondary | ICD-10-CM

## 2020-07-28 DIAGNOSIS — M7751 Other enthesopathy of right foot: Secondary | ICD-10-CM | POA: Diagnosis not present

## 2020-07-28 DIAGNOSIS — M19071 Primary osteoarthritis, right ankle and foot: Secondary | ICD-10-CM

## 2020-07-28 NOTE — Progress Notes (Signed)
Cast for cmfo: deep heel cup, 4* medial flange to offload medial column collapse.

## 2020-08-11 ENCOUNTER — Emergency Department (HOSPITAL_BASED_OUTPATIENT_CLINIC_OR_DEPARTMENT_OTHER)
Admission: EM | Admit: 2020-08-11 | Discharge: 2020-08-11 | Disposition: A | Discharge status: Home / Self Care | Payer: No Typology Code available for payment source | Attending: Emergency Medicine | Admitting: Emergency Medicine

## 2020-08-11 ENCOUNTER — Encounter (HOSPITAL_BASED_OUTPATIENT_CLINIC_OR_DEPARTMENT_OTHER): Payer: Self-pay | Admitting: Emergency Medicine

## 2020-08-11 ENCOUNTER — Emergency Department (HOSPITAL_BASED_OUTPATIENT_CLINIC_OR_DEPARTMENT_OTHER): Payer: No Typology Code available for payment source

## 2020-08-11 ENCOUNTER — Other Ambulatory Visit: Payer: Self-pay

## 2020-08-11 DIAGNOSIS — M25561 Pain in right knee: Secondary | ICD-10-CM

## 2020-08-11 DIAGNOSIS — M25562 Pain in left knee: Secondary | ICD-10-CM | POA: Diagnosis not present

## 2020-08-11 DIAGNOSIS — M79661 Pain in right lower leg: Secondary | ICD-10-CM | POA: Insufficient documentation

## 2020-08-11 IMAGING — CR DG KNEE COMPLETE 4+V*L*
4 series · 4 of 4 positions shown · non-contrast
Comparison: [DATE]

CLINICAL DATA: Pain and swelling

EXAM:
LEFT KNEE - COMPLETE 4+ VIEW

[t knee ap left]
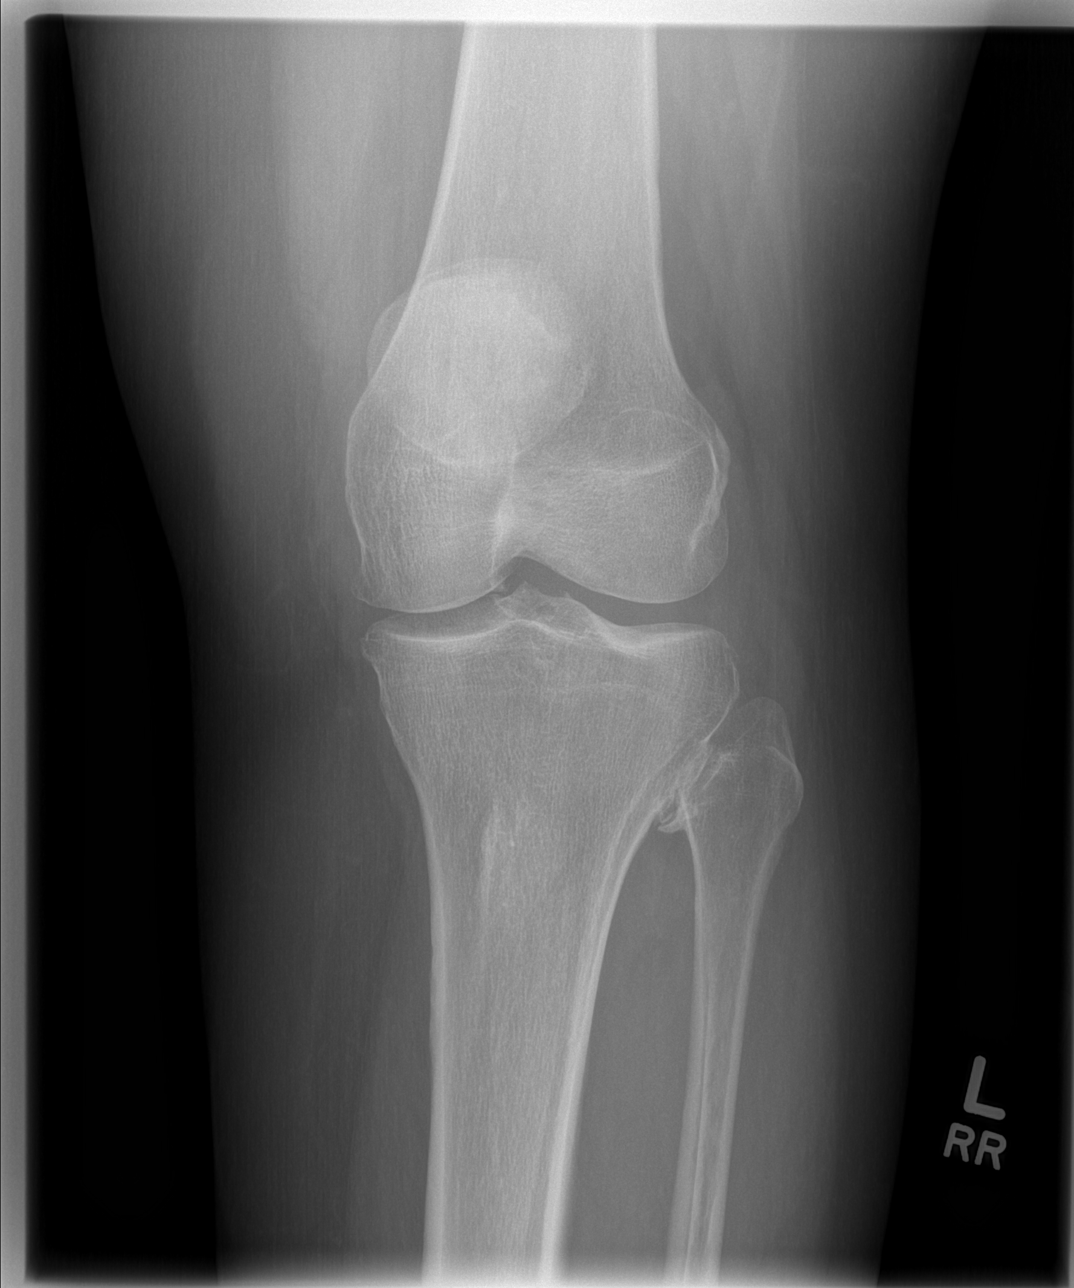

[t knee oblique left (1 of 2)]
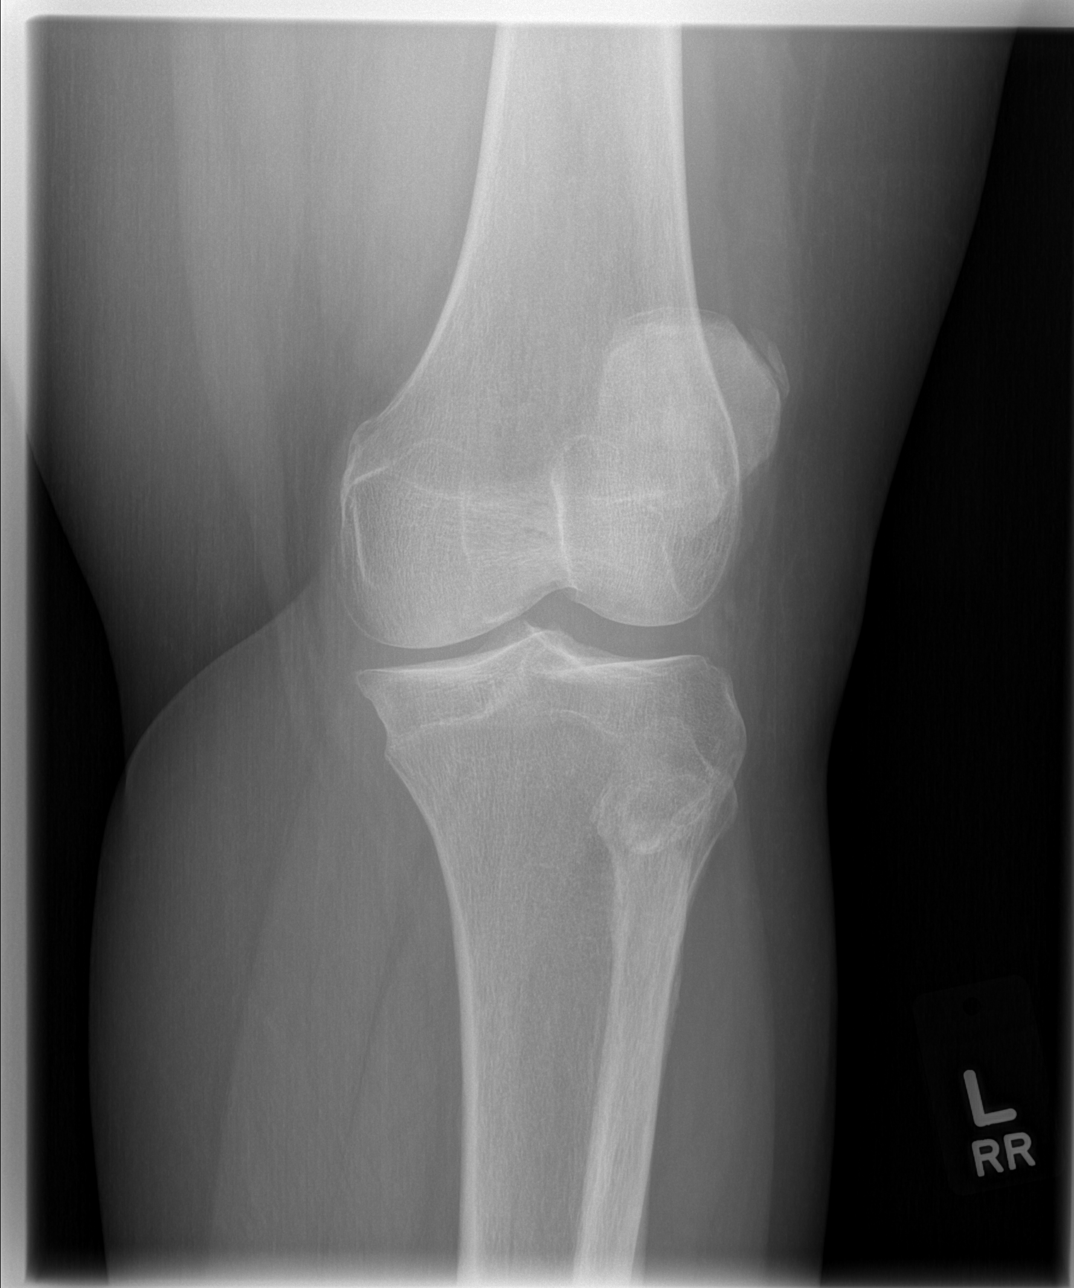

[t knee oblique left (2 of 2)]
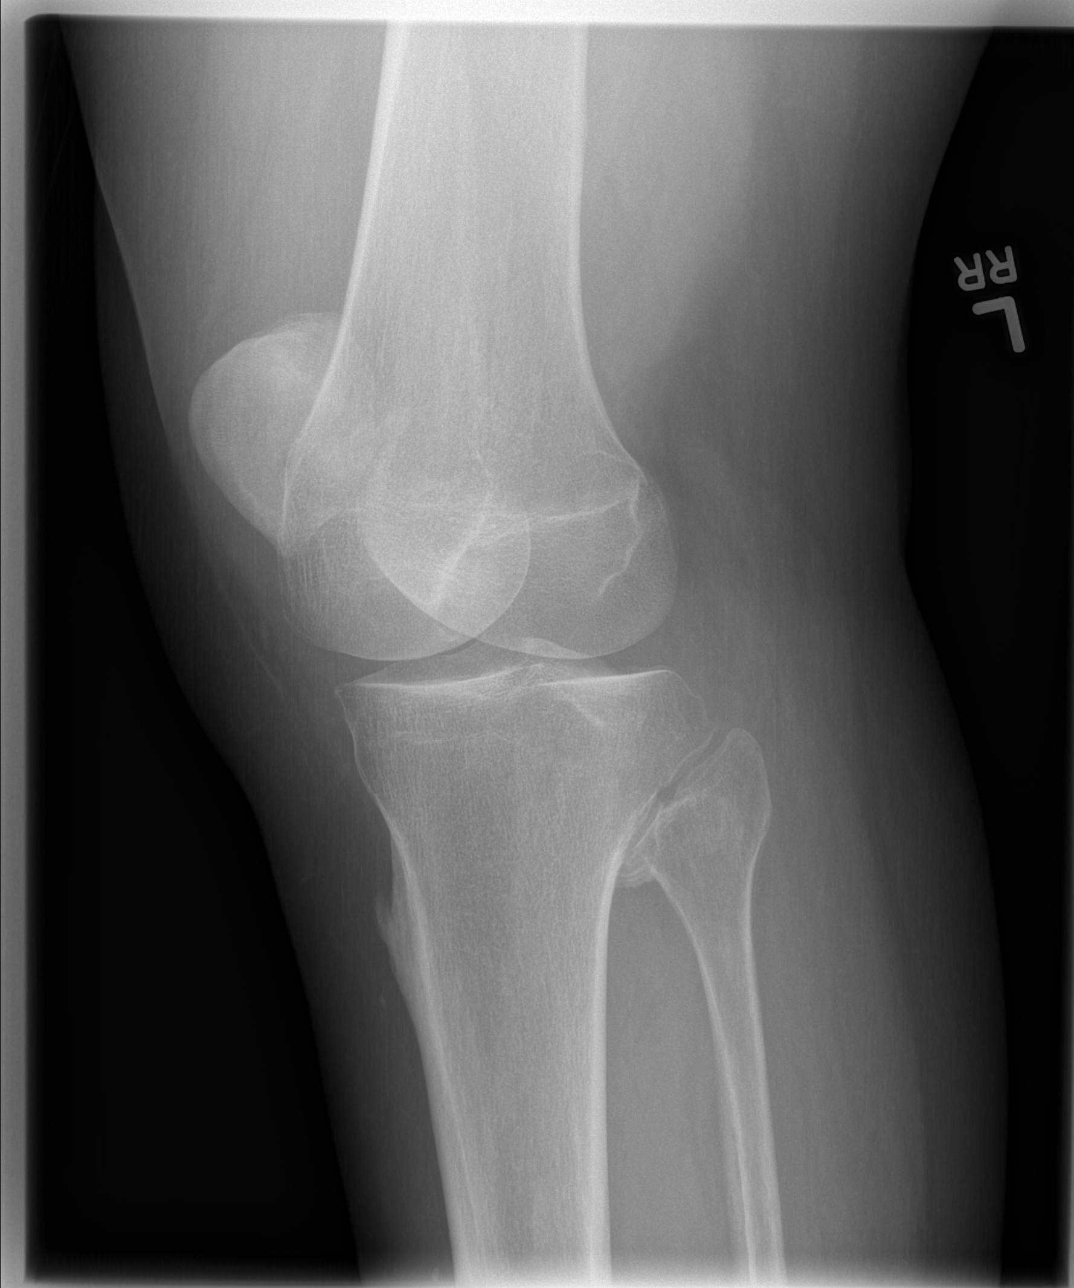

[t knee lat left]
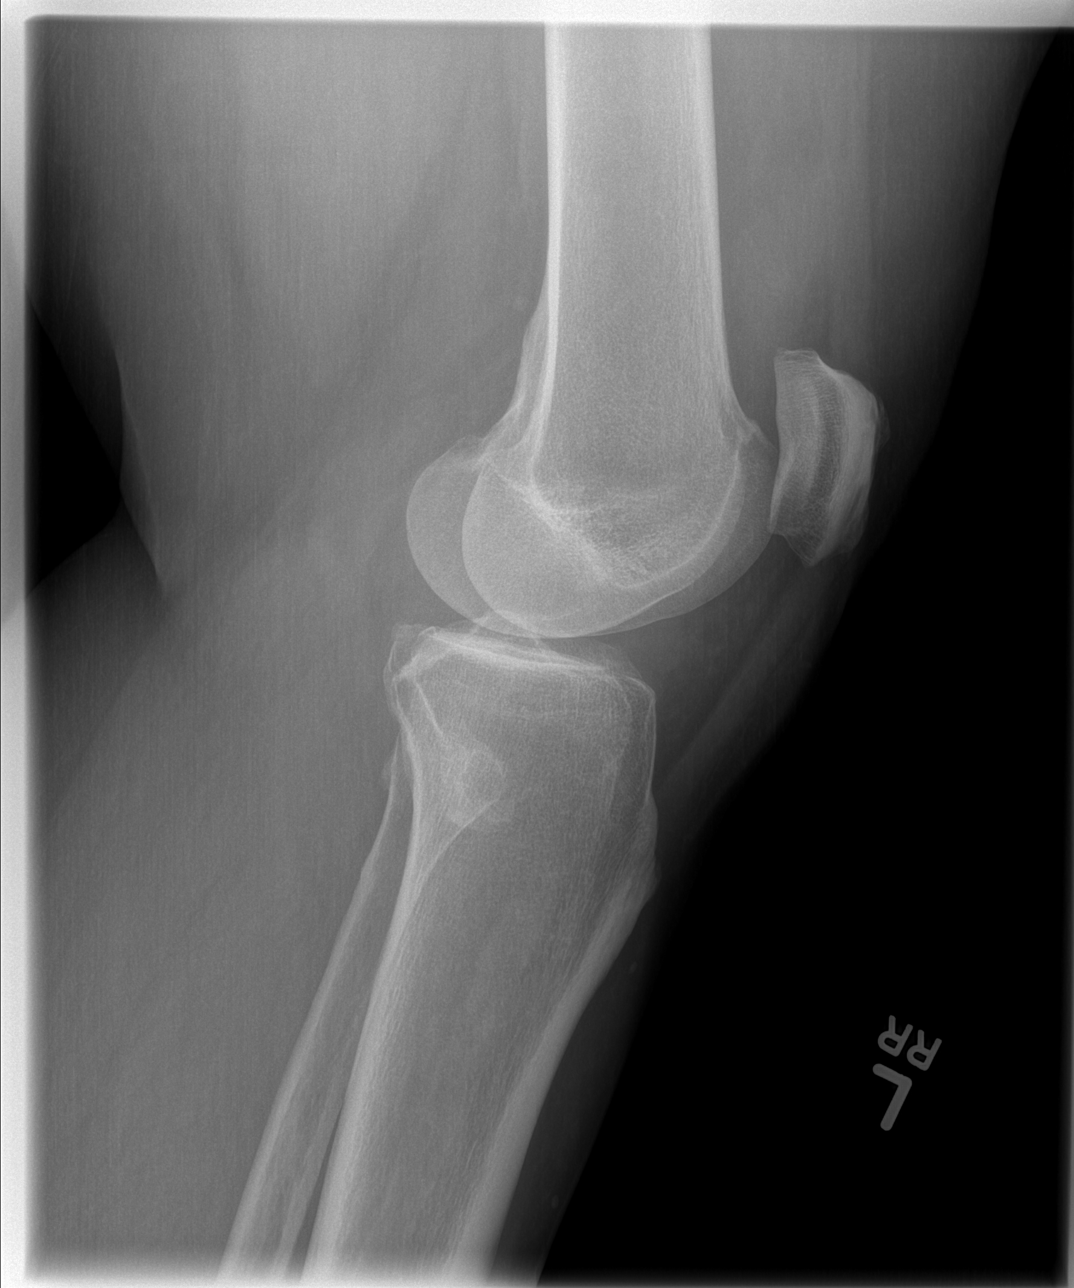

[4 of 4 positions shown; findings below may reference images not displayed]

FINDINGS: Frontal, lateral, and bilateral oblique views were obtained. There
is no appreciable fracture or dislocation. Apparent bipartite
patella. No knee joint effusion. There is slight joint space
narrowing medially. Other joint spaces appear normal. There is
osteoarthritic change at the proximal tibia fibular syndesmosis,
stable.
IMPRESSION: No fracture, dislocation, or joint effusion. Apparent small focus of
bipartite patella. Narrowing medially. Osteoarthritic change at the
proximal tibia fibular syndesmosis. Appearance stable compared to
prior study.

## 2020-08-11 IMAGING — US US EXTREM LOW VENOUS*R*
1 series · 14 of 24 positions shown · non-contrast
Comparison: None.

CLINICAL DATA: Pain.

EXAM:
RIGHT LOWER EXTREMITY VENOUS DOPPLER ULTRASOUND
TECHNIQUE: Gray-scale sonography with compression, as well as color and duplex
ultrasound, were performed to evaluate the deep venous system(s)
from the level of the common femoral vein through the popliteal and
proximal calf veins.

[Series 1: us extrem low venous*right* · 14 of 32 slices shown]
[im 1/32]
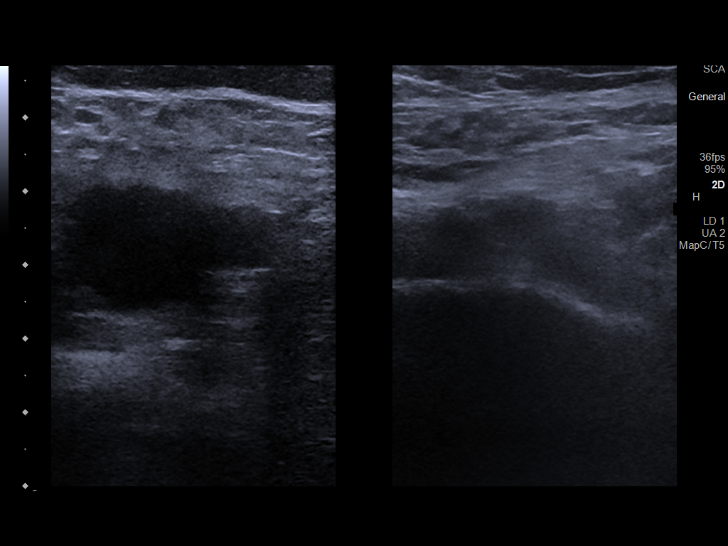
[im 3/32]
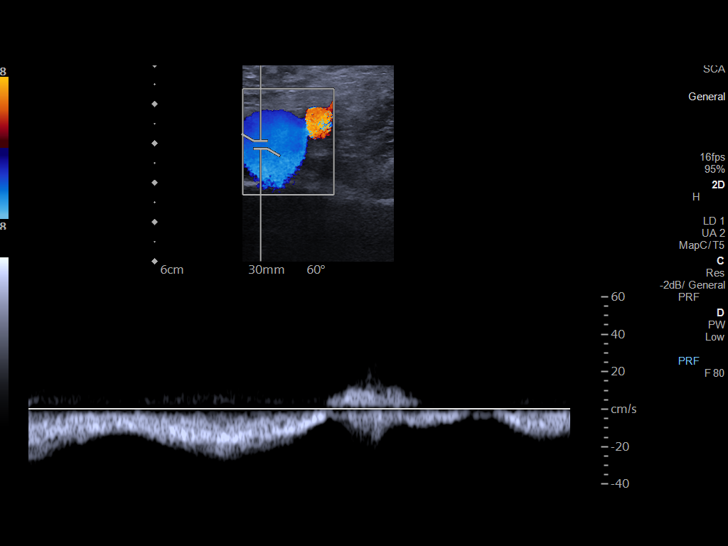
[im 6/32]
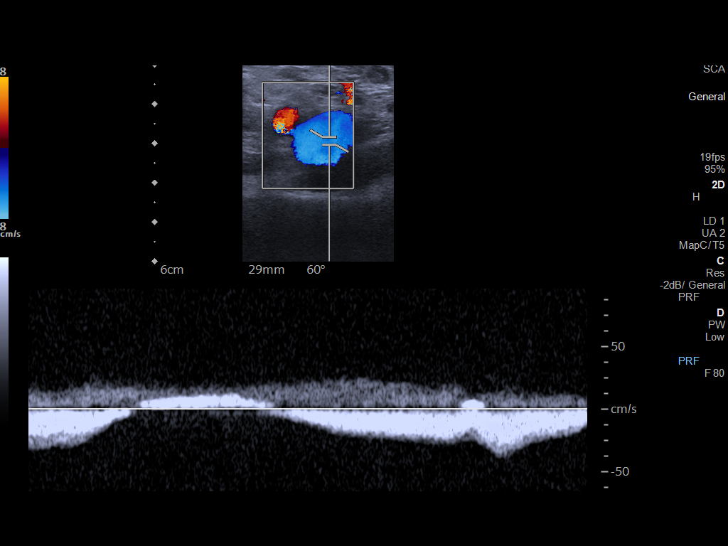
[im 9/32]
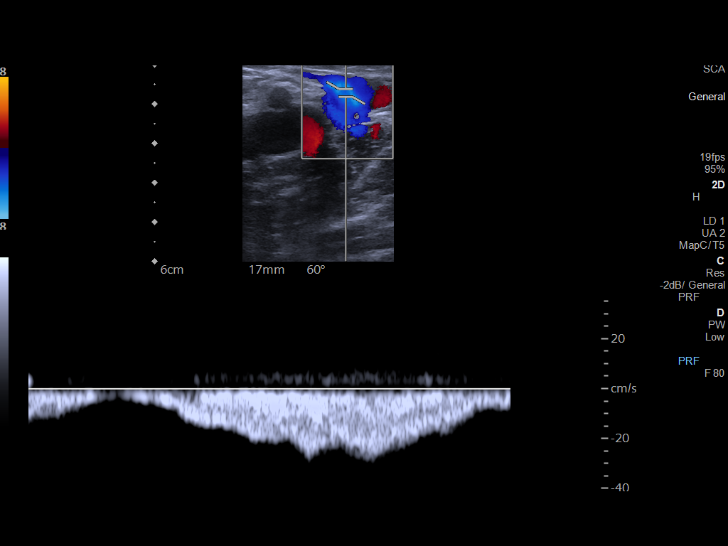
[im 10/32]
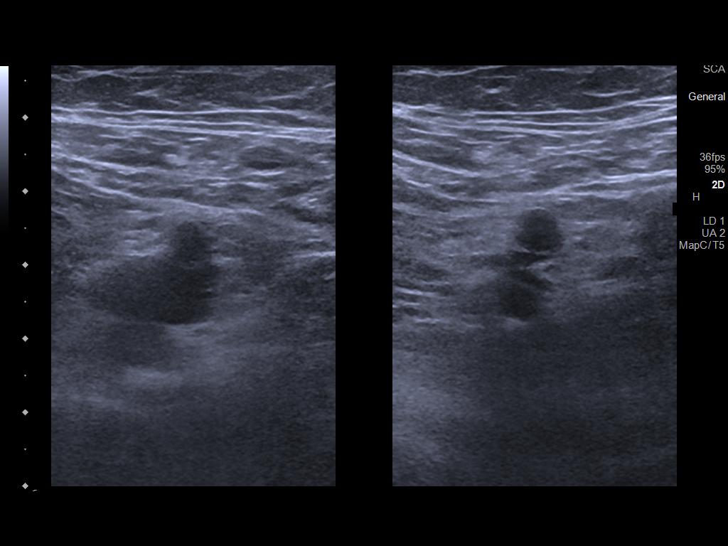
[im 13/32]
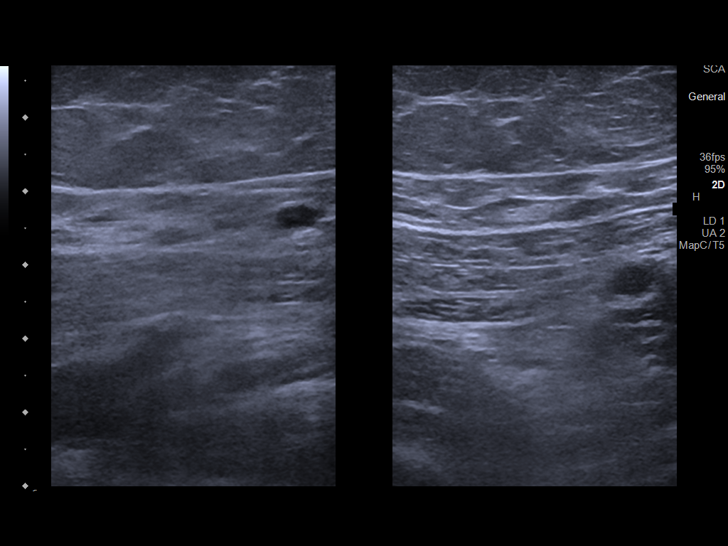
[im 15/32]
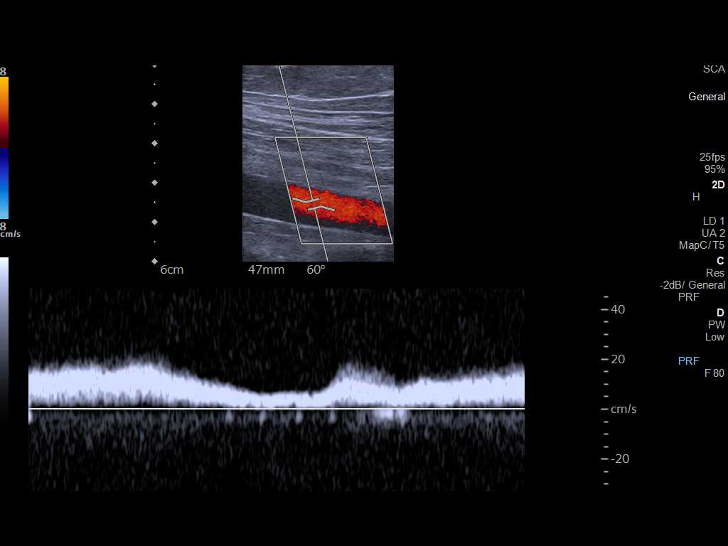
[im 17/32]
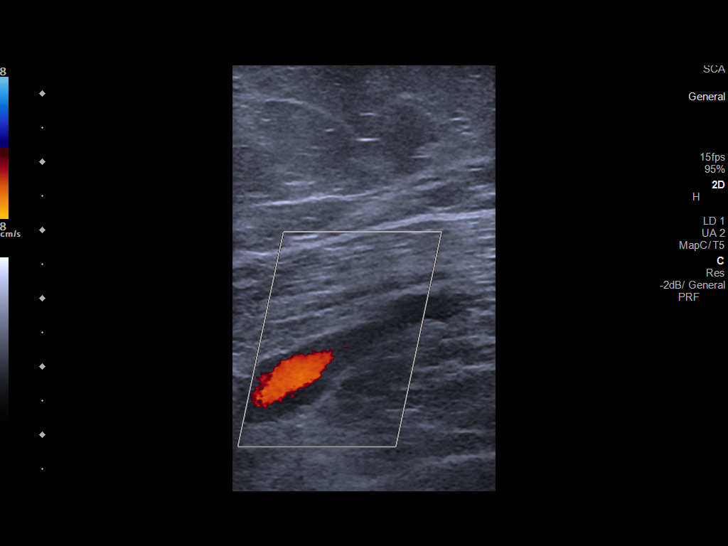
[im 19/32]
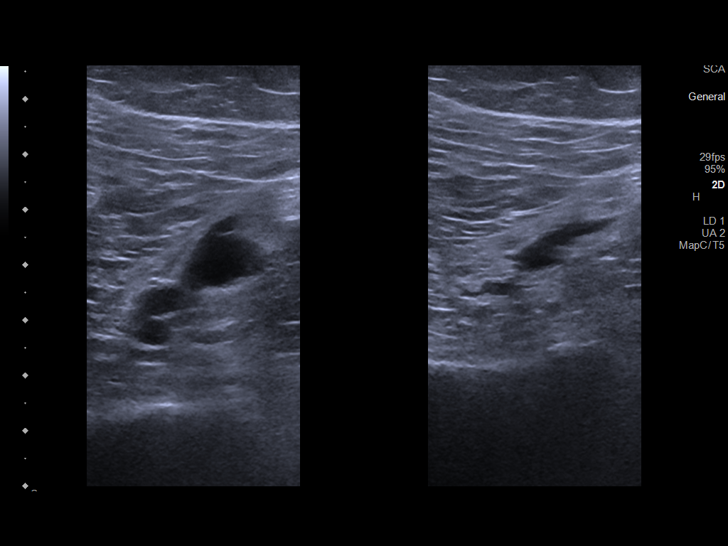
[im 22/32]
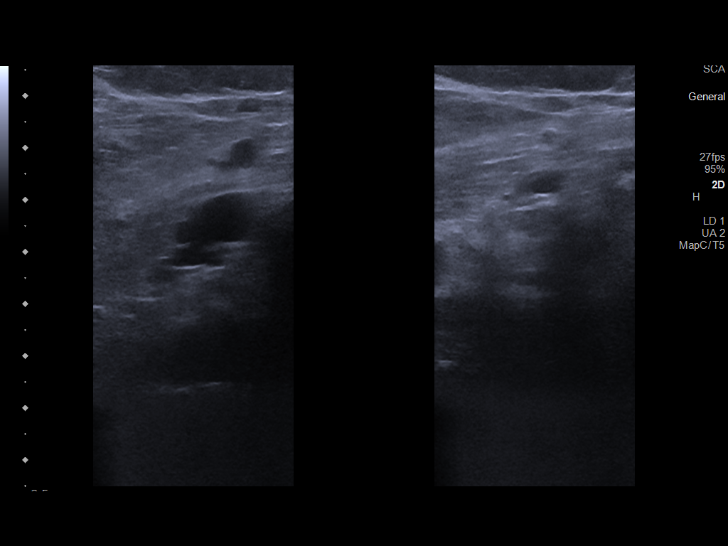
[im 25/32]
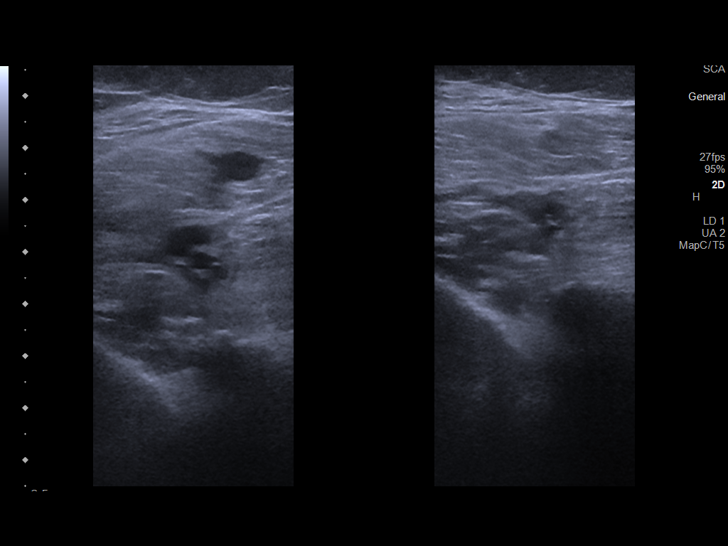
[im 26/32]
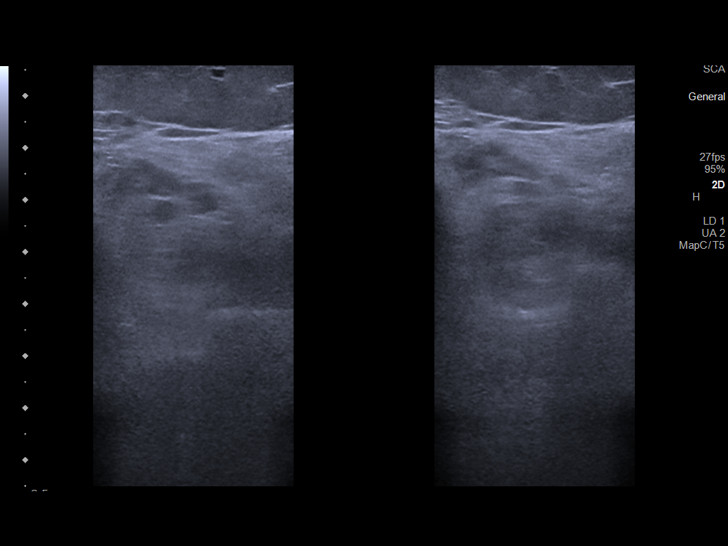
[im 29/32]
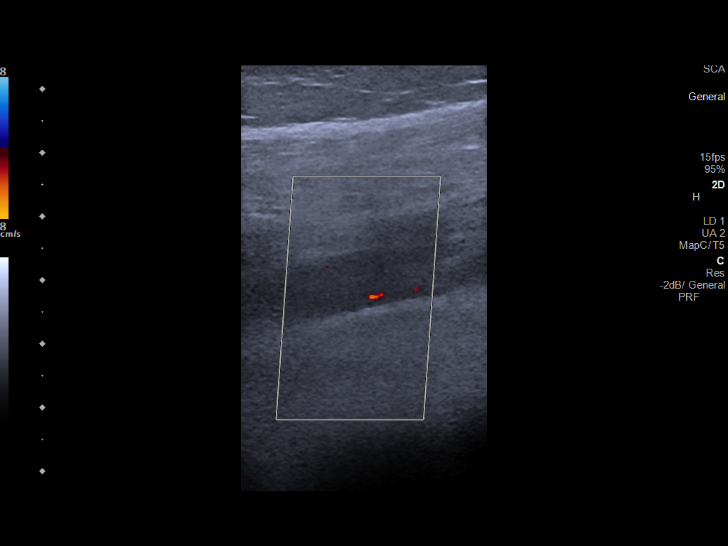
[im 32/32]
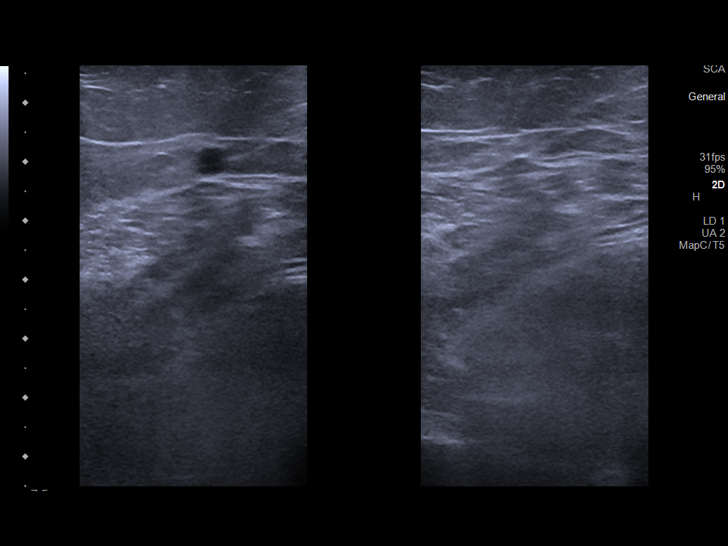

[14 of 24 positions shown; findings below may reference images not displayed]

FINDINGS: VENOUS

Normal compressibility of the common femoral, superficial femoral,
and popliteal veins, as well as the visualized calf veins.
Visualized portions of profunda femoral vein and great saphenous
vein unremarkable. No filling defects to suggest DVT on grayscale or
color Doppler imaging. Doppler waveforms show normal direction of
venous flow, normal respiratory plasticity and response to
augmentation.

Limited views of the contralateral common femoral vein are
unremarkable.
IMPRESSION: Negative for DVT.

## 2020-08-11 IMAGING — CR DG KNEE COMPLETE 4+V*R*
4 series · 4 of 4 positions shown · non-contrast
Comparison: No prior.

CLINICAL DATA: Bilateral knee pain.  Swelling.

EXAM:
RIGHT KNEE - COMPLETE 4+ VIEW

[t knee ap right]
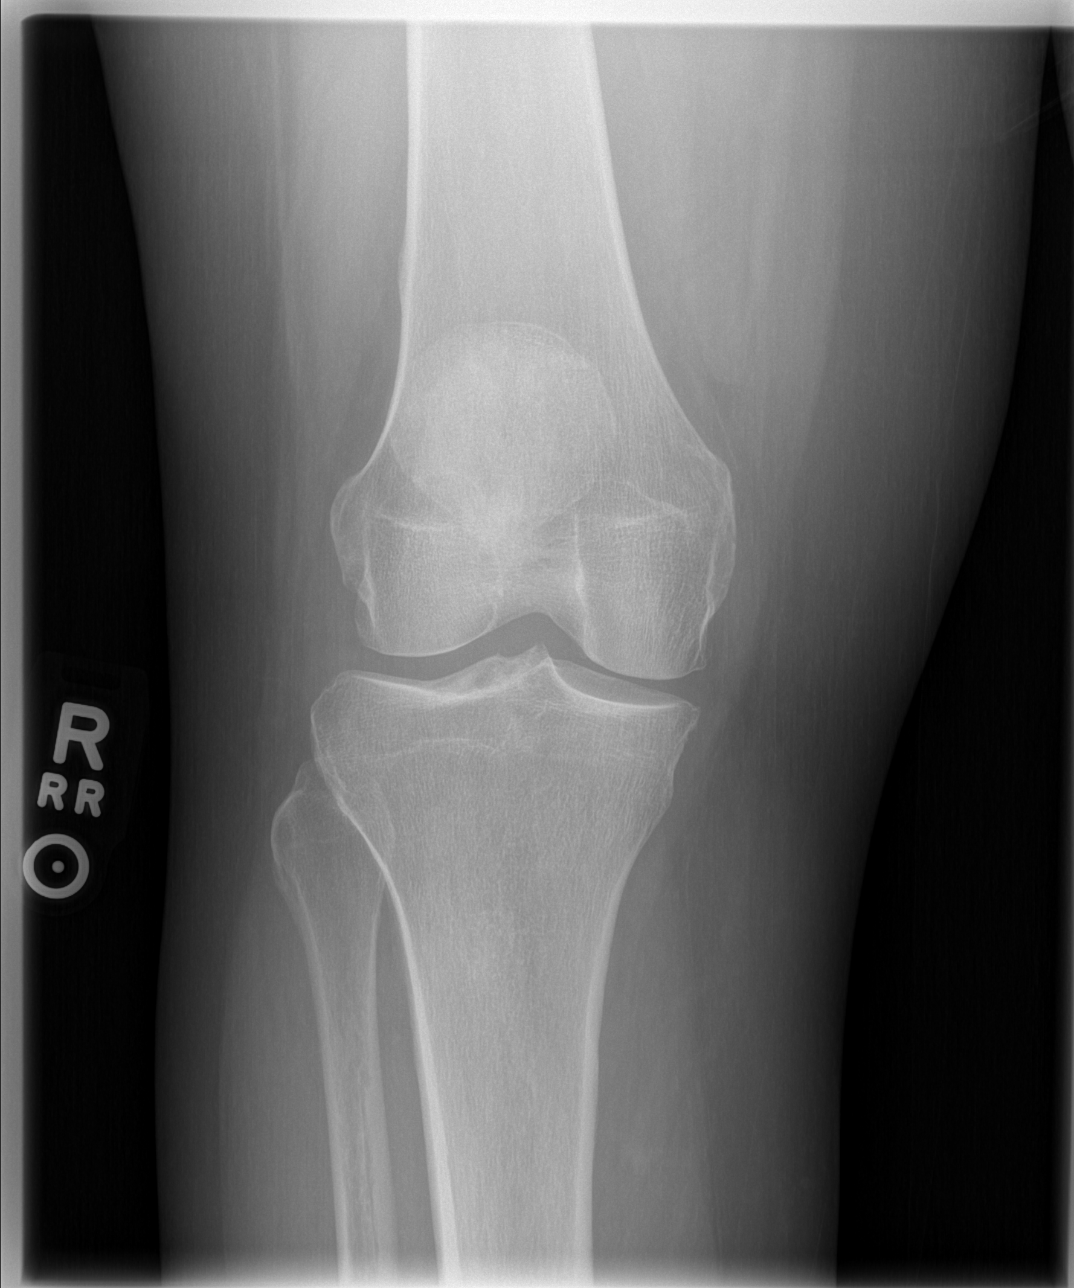

[t knee oblique right (1 of 2)]
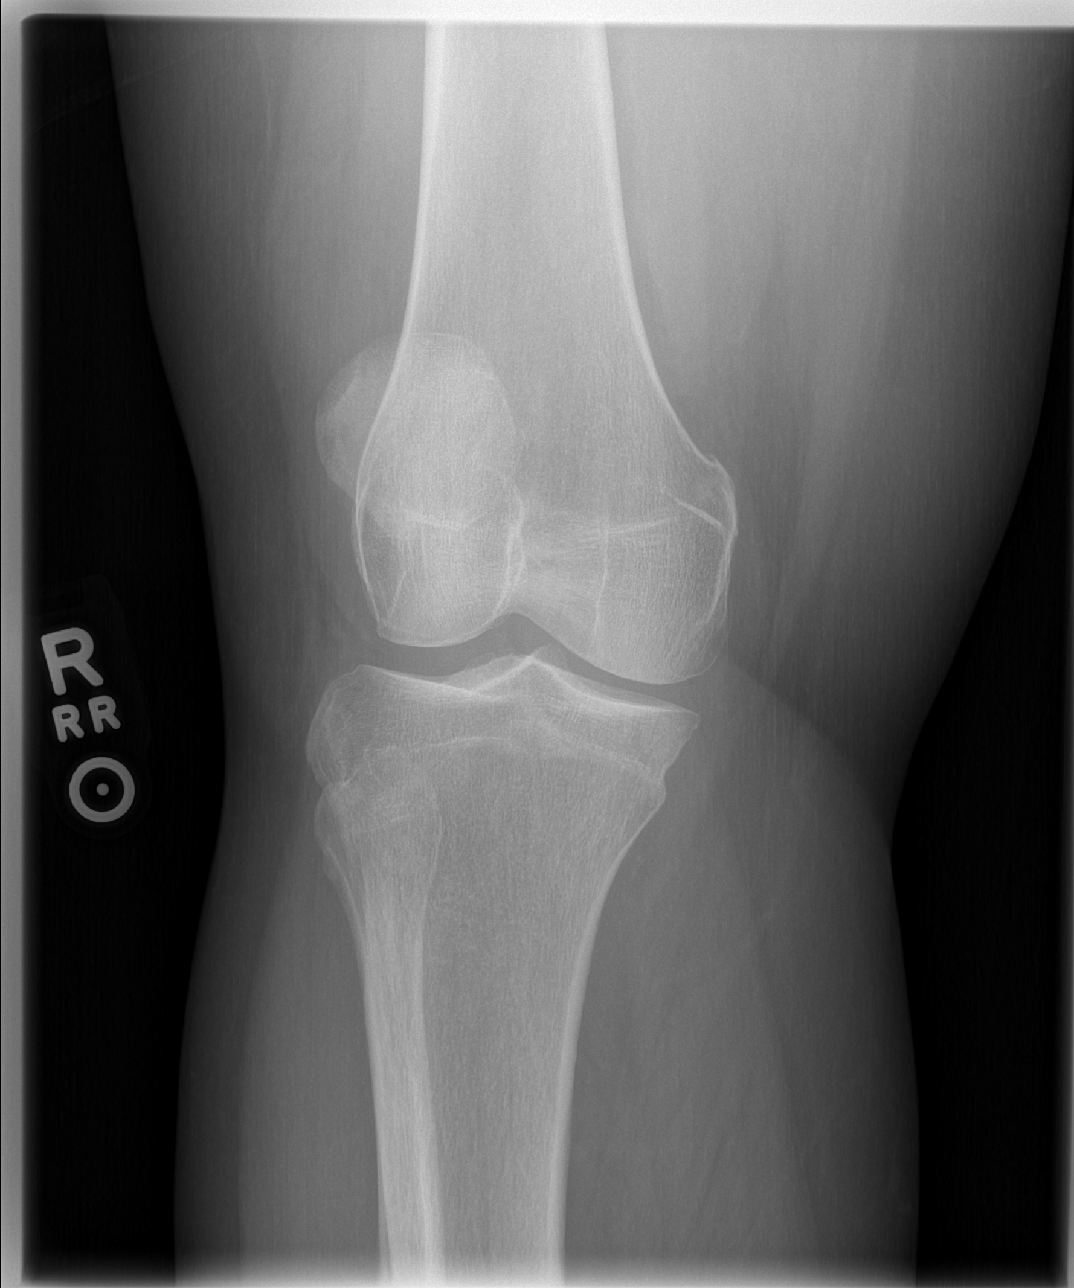

[t knee oblique right (2 of 2)]
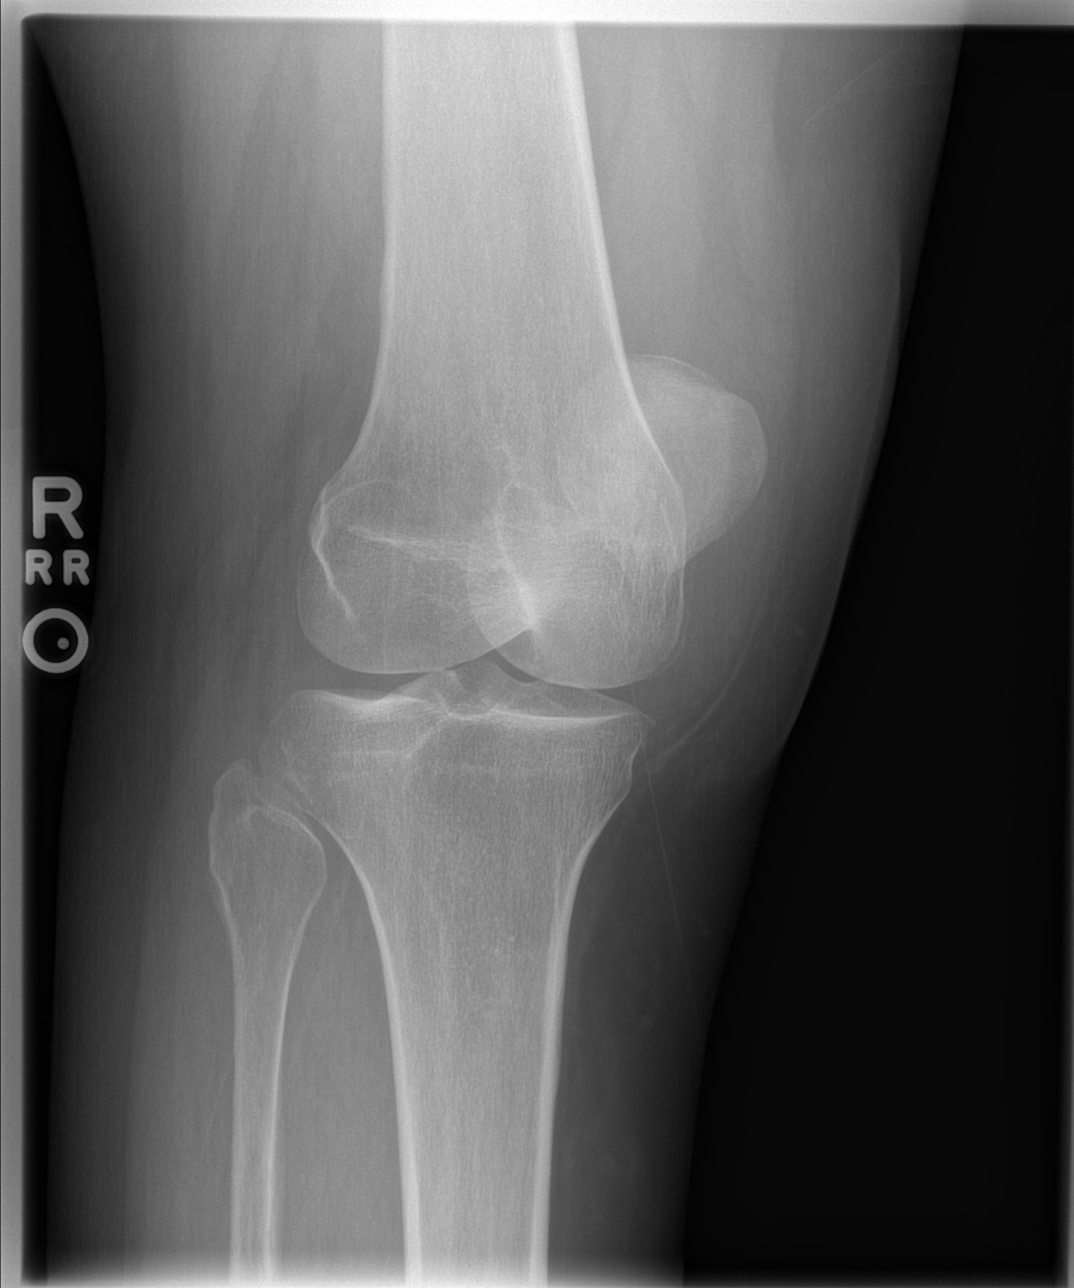

[t knee lat right]
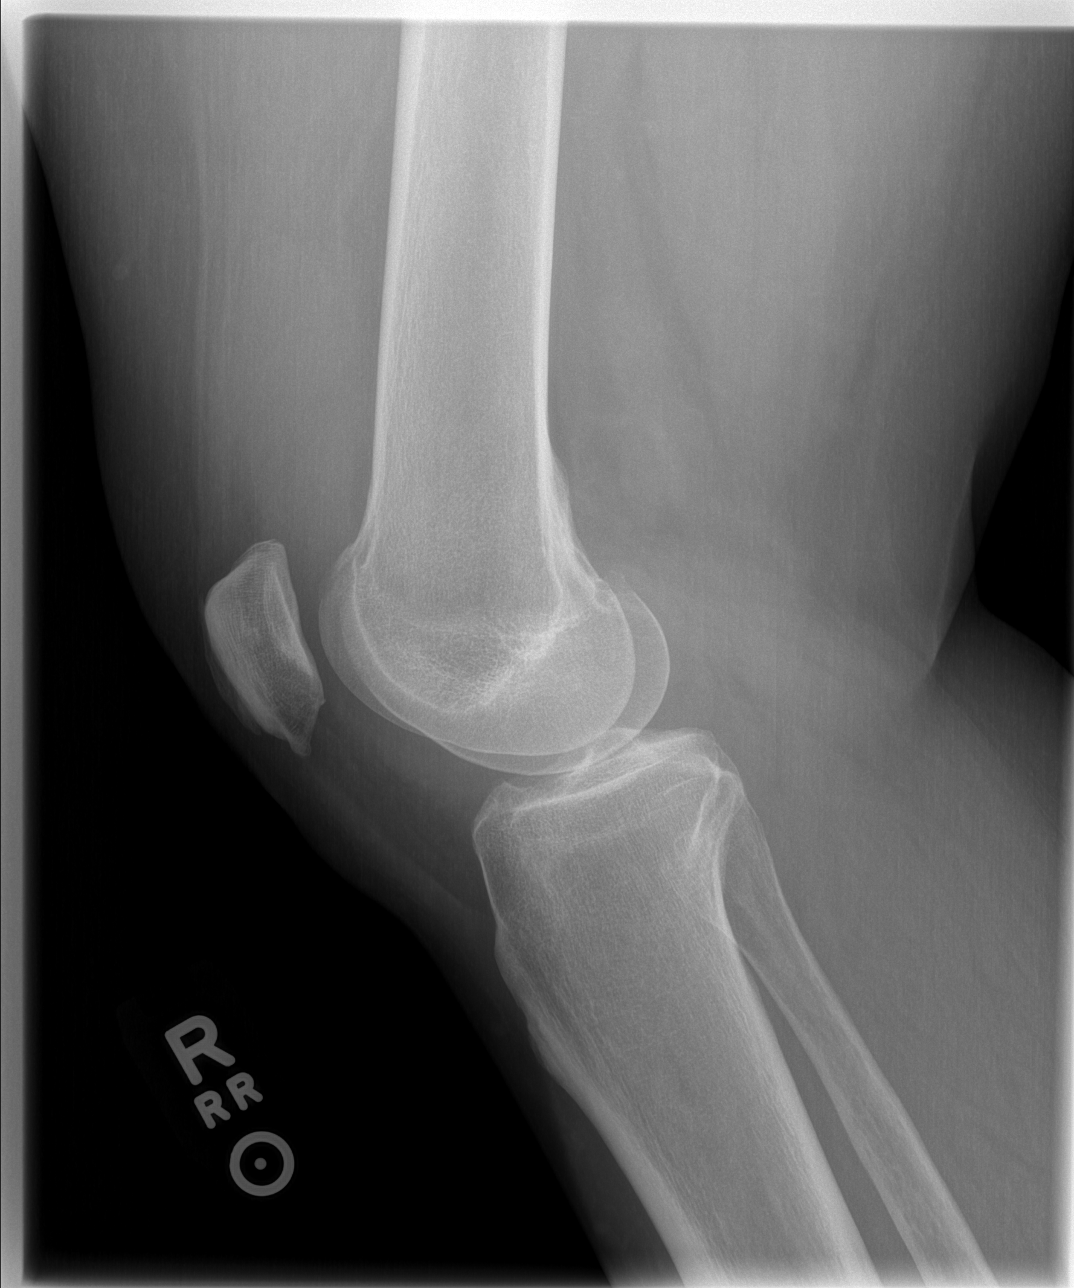

[4 of 4 positions shown; findings below may reference images not displayed]

FINDINGS: Prominent knee joint effusion. Mild tricompartment degenerative
change. No acute bony or joint abnormality. No evidence of fracture
dislocation.
IMPRESSION: 1. Prominent knee joint effusion.
2. Mild tricompartment degenerative change. No acute bony
abnormality.

## 2020-08-11 MED ORDER — NAPROXEN 500 MG PO TABS
500.0000 mg | ORAL_TABLET | Freq: Two times a day (BID) | ORAL | 0 refills | Status: DC
Start: 2020-08-11 — End: 2021-02-25

## 2020-08-11 NOTE — ED Notes (Signed)
Pt in xray will get vitals when they return

## 2020-08-11 NOTE — Discharge Instructions (Addendum)
Begin taking naproxen as prescribed.  Rest.  Follow-up with primary doctor if symptoms or not improving in the next week, and return to the ER if symptoms significantly worsen or change.

## 2020-08-11 NOTE — ED Triage Notes (Signed)
Pt recently traveled.  Pt c/o bilateral knee pain, right knee started on Sunday along with right calf pain.  No known injuries.  No fever.

## 2020-08-11 NOTE — ED Provider Notes (Signed)
[facility] EMERGENCY DEPARTMENT Provider Note   CSN: [id number] Arrival date & time: 08/11/20  1004     History Chief Complaint  Patient presents with  . Leg Pain  . Knee Pain    Samantha Holden is a 49 y.o. female.  Patient is a 49 year old female with no significant past medical history. She presents today for evaluation of bilateral knee pain and right calf pain. Patient recently traveled to [place] prior to the onset of symptoms. She denies any chest pain or difficulty breathing. She denies any specific injury or trauma. She has had issues with pain and swelling in the left knee in the past, however the right knee is also involved now. Patient denies history of DVT. Pain is worse with ambulation and relieved somewhat with rest.  The history is provided by the patient.       Past Medical History:  Diagnosis Date  . Allergy   . Chicken pox     Patient Active Problem List   Diagnosis Date Noted  . Symptomatic mammary hypertrophy 04/28/2020  . Shoulder pain 04/28/2020  . Back pain 04/28/2020  . Impingement syndrome of left ankle 12/30/2019  . Prediabetes 12/23/2019  . Right ankle pain 02/16/2018    Past Surgical History:  Procedure Laterality Date  . DILATION AND CURETTAGE, DIAGNOSTIC / THERAPEUTIC  1992  . WISDOM TOOTH EXTRACTION       OB History   No obstetric history on file.     Family History  Problem Relation Age of Onset  . Breast cancer Mother   . Arthritis Mother   . Hypertension Mother   . Colon polyps Mother   . Cancer Father   . Bladder Cancer Father   . Heart attack Father   . Arthritis Maternal Grandmother   . Heart disease Maternal Grandfather   . Arthritis Maternal Grandfather   . Heart attack Maternal Grandfather   . Hypertension Maternal Grandfather   . Colon cancer Neg Hx   . Esophageal cancer Neg Hx   . Stomach cancer Neg Hx   . Rectal cancer Neg Hx     Social History   Tobacco Use  . Smoking status: Never  Smoker  . Smokeless tobacco: Never Used  Vaping Use  . Vaping Use: Never used  Substance Use Topics  . Alcohol use: No  . Drug use: Never    Home Medications Prior to Admission medications   Medication Sig Start Date End Date Taking? Authorizing Provider  cyclobenzaprine (FLEXERIL) 10 MG tablet Take 1 tablet (10 mg total) by mouth 2 (two) times daily as needed for muscle spasms. 02/06/20   Copland, Gay Filler, MD  meloxicam (MOBIC) 15 MG tablet TAKE 1 TABLET BY MOUTH EVERY DAY 07/14/20   Edrick Kins, DPM  methylPREDNISolone (MEDROL DOSEPAK) 4 MG TBPK tablet 6 day dose pack - take as directed 07/15/20   Edrick Kins, DPM  predniSONE (STERAPRED UNI-PAK 21 TAB) 10 MG (21) TBPK tablet Take by mouth daily. Take 6 tabs by mouth daily  for 2 days, then 5 tabs for 2 days, then 4 tabs for 2 days, then 3 tabs for 2 days, 2 tabs for 2 days, then 1 tab by mouth daily for 2 days 02/29/20   Hayden Rasmussen, MD    Allergies    Patient has no known allergies.  Review of Systems   Review of Systems  All other systems reviewed and are negative.   Physical Exam Updated Vital  Signs BP (!) 124/95   Pulse 92   Temp 98 F (36.7 C)   Resp 16   Ht 5\' 11"  (1.803 m)   Wt 131.5 kg   LMP 11/11/2019   SpO2 100%   BMI 40.45 kg/m   Physical Exam Vitals and nursing note reviewed.  Constitutional:      General: She is not in acute distress.    Appearance: She is well-developed. She is not diaphoretic.  HENT:     Head: Normocephalic and atraumatic.  Cardiovascular:     Rate and Rhythm: Normal rate and regular rhythm.     Heart sounds: No murmur heard.  No friction rub. No gallop.   Pulmonary:     Effort: Pulmonary effort is normal. No respiratory distress.     Breath sounds: Normal breath sounds. No wheezing.  Abdominal:     General: Bowel sounds are normal. There is no distension.     Palpations: Abdomen is soft.     Tenderness: There is no abdominal tenderness.  Musculoskeletal:         General: Normal range of motion.     Cervical back: Normal range of motion and neck supple.     Comments: There is a moderate sized right knee effusion noted. She has good range of motion with no crepitus. There is no laxity with varus or valgus stress and anterior/posterior drawer is negative. There is tenderness to the right calf, however no palpable cord. There is no leg edema and Bevelyn Buckles' sign is absent.  Skin:    General: Skin is warm and dry.  Neurological:     Mental Status: She is alert and oriented to person, place, and time.     ED Results / Procedures / Treatments   Labs (all labs ordered are listed, but only abnormal results are displayed) Labs Reviewed - No data to display  EKG None  Radiology No results found.  Procedures Procedures (including critical care time)  Medications Ordered in ED Medications - No data to display  ED Course  I have reviewed the triage vital signs and the nursing notes.  Pertinent labs & imaging results that were available during my care of the patient were reviewed by me and considered in my medical decision making (see chart for details).    MDM Rules/Calculators/A&P  Ultrasound is negative for DVT.  X-rays show a moderate right knee effusion with degenerative changes.  I highly doubt a septic joint and suspect DJD with effusion.  Patient will be treated with NSAIDs, rest, and follow-up with her orthopedist if not improving in the next week.  Final Clinical Impression(s) / ED Diagnoses Final diagnoses:  None    Rx / DC Orders ED Discharge Orders    None       Veryl Speak, MD 08/12/20 [phone]

## 2020-08-17 ENCOUNTER — Ambulatory Visit (INDEPENDENT_AMBULATORY_CARE_PROVIDER_SITE_OTHER): Payer: No Typology Code available for payment source | Admitting: Orthopaedic Surgery

## 2020-08-17 ENCOUNTER — Other Ambulatory Visit: Payer: Self-pay

## 2020-08-17 DIAGNOSIS — M25561 Pain in right knee: Secondary | ICD-10-CM

## 2020-08-17 DIAGNOSIS — G8929 Other chronic pain: Secondary | ICD-10-CM

## 2020-08-17 DIAGNOSIS — M25562 Pain in left knee: Secondary | ICD-10-CM

## 2020-08-17 MED ORDER — LIDOCAINE HCL 1 % IJ SOLN
3.0000 mL | INTRAMUSCULAR | Status: AC | PRN
  Administered 2020-08-17: 3 mL

## 2020-08-17 MED ORDER — METHYLPREDNISOLONE ACETATE 40 MG/ML IJ SUSP
40.0000 mg | INTRAMUSCULAR | Status: AC | PRN
  Administered 2020-08-17: 40 mg via INTRA_ARTICULAR

## 2020-08-17 NOTE — Progress Notes (Addendum)
Office Visit Note   Patient: Samantha Holden           Date of Birth: [birth year]           MRN: [id number] Visit Date: 08/17/2020              Requested by: Darreld Mclean, MD [address] [place],  [place] [address] PCP: Darreld Mclean, MD   Assessment & Plan: Visit Diagnoses:  1. Chronic pain of left knee   2. Chronic pain of right knee     Plan: I was able to aspirate 50 cc of fluid off of the patient's knee and place a steroid injection in it they gave her immediate relief.  Given the chronicity of both her knees hurting and given the fact that we have drawn large effusions off of both knees, a MRI of both knees is warranted at this point to rule out internal derangement that is causing her to have this recurrent effusions of both knees.  I still advocated weight loss and activity modification as well as quad strengthening.  She tolerated the steroid injection well in her right knee.  I will see her back in follow-up after going over hopefully MRIs of both knees.  Follow-Up Instructions: Return in about 4 weeks (around 09/14/2020).   Orders:  No orders of the defined types were placed in this encounter.  No orders of the defined types were placed in this encounter.     Procedures: Large Joint Inj: R knee on 08/17/2020 2:20 PM Indications: diagnostic evaluation and pain Details: 22 G 1.5 in needle, superolateral approach  Arthrogram: No  Medications: 3 mL lidocaine 1 %; 40 mg methylPREDNISolone acetate 40 MG/ML Outcome: tolerated well, no immediate complications Procedure, treatment alternatives, risks and benefits explained, specific risks discussed. Consent was given by the patient. Immediately prior to procedure a time out was called to verify the correct patient, procedure, equipment, support staff and site/side marked as required. Patient was prepped and draped in the usual sterile fashion.       Clinical Data: No additional  findings.   Subjective: Chief Complaint  Patient presents with  . Left Knee - Pain  . Right Knee - Pain  The patient comes in today for continued follow-up with chronic bilateral knee pain.  Back in September we did aspirate fluid from her left knee and place a steroid injection in that knee.  Recently she had a car trip to [place].  She came in presented to emergency room on 08/11/2020 with calf pain and knee pain.  X-rays showed some mild Ahmar arthritis in her knees.  Her Doppler for her right calf was negative for DVT.  She still reports right knee pain acutely but her chronic pain in the left knee can be worse in the right knee.  She still reports right calf pain.  HPI  Review of Systems Today she denies any headache, chest pain, shortness of breath, fever, chills, nausea, vomiting  Objective: Vital Signs: There were no vitals taken for this visit.  Physical Exam She does not appear acutely ill or short of breath. Ortho Exam Examination of her right knee does show mild effusion.  This may be accounting for and contributing to her calf pain.  The calf is soft itself.  The left knee does not have an effusion today.  Both knees are painful throughout the arc of motion. Specialty Comments:  No specialty comments available.  Imaging: No  results found. X-rays of both knees from just a little over a week ago showed no acute findings.  There is some what I describe is mild arthritic changes in both knees.  PMFS History: Patient Active Problem List   Diagnosis Date Noted  . Symptomatic mammary hypertrophy 04/28/2020  . Shoulder pain 04/28/2020  . Back pain 04/28/2020  . Impingement syndrome of left ankle 12/30/2019  . Prediabetes 12/23/2019  . Right ankle pain 02/16/2018   Past Medical History:  Diagnosis Date  . Allergy   . Chicken pox     Family History  Problem Relation Age of Onset  . Breast cancer Mother   . Arthritis Mother   . Hypertension Mother   . Colon polyps  Mother   . Cancer Father   . Bladder Cancer Father   . Heart attack Father   . Arthritis Maternal Grandmother   . Heart disease Maternal Grandfather   . Arthritis Maternal Grandfather   . Heart attack Maternal Grandfather   . Hypertension Maternal Grandfather   . Colon cancer Neg Hx   . Esophageal cancer Neg Hx   . Stomach cancer Neg Hx   . Rectal cancer Neg Hx     Past Surgical History:  Procedure Laterality Date  . DILATION AND CURETTAGE, DIAGNOSTIC / THERAPEUTIC  1992  . WISDOM TOOTH EXTRACTION     Social History   Occupational History  . Not on file  Tobacco Use  . Smoking status: Never Smoker  . Smokeless tobacco: Never Used  Vaping Use  . Vaping Use: Never used  Substance and Sexual Activity  . Alcohol use: No  . Drug use: Never  . Sexual activity: Not on file

## 2020-08-27 ENCOUNTER — Encounter: Payer: No Typology Code available for payment source | Admitting: Orthotics

## 2020-09-03 ENCOUNTER — Other Ambulatory Visit: Payer: Self-pay | Admitting: Podiatry

## 2020-09-08 ENCOUNTER — Ambulatory Visit
Admission: RE | Admit: 2020-09-08 | Discharge: 2020-09-08 | Disposition: A | Discharge status: Home / Self Care | Payer: No Typology Code available for payment source | Source: Ambulatory Visit | Attending: Orthopaedic Surgery | Admitting: Orthopaedic Surgery

## 2020-09-08 DIAGNOSIS — G8929 Other chronic pain: Secondary | ICD-10-CM

## 2020-09-08 IMAGING — MR MR KNEE*L* W/O CM
6 series · 40 of 40 positions shown · non-contrast
Comparison: Radiographs [DATE]

CLINICAL DATA: Bilateral knee pain for 1-2 months. No known injury,
prior relevant surgery or history of malignancy.

EXAM:
MRI OF THE LEFT KNEE WITHOUT CONTRAST
TECHNIQUE: Multiplanar, multisequence MR imaging of the knee was performed. No
intravenous contrast was administered.

[Series 3: T2 fat-sat · axial · left · 4.0mm · 0.50mm/px · z∈[-126,+14]mm · 8 of 33 slices shown (1 of 3)]
[im 1/33]
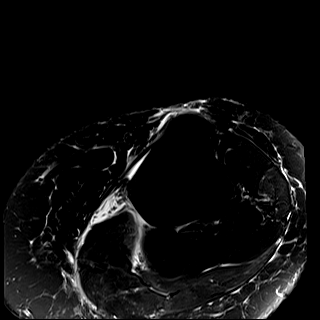
[im 5/33]
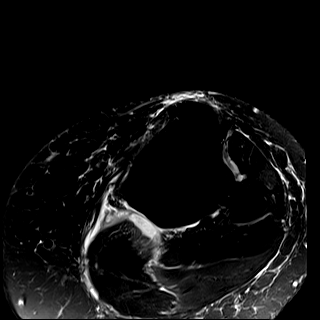
[im 10/33]
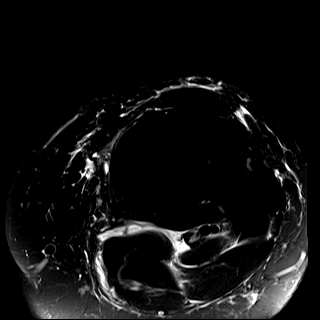
[im 14/33]
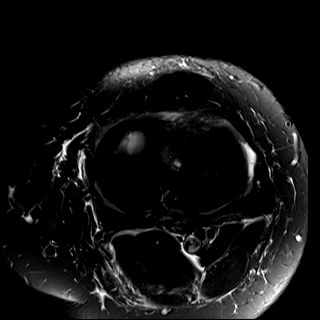
[im 19/33]
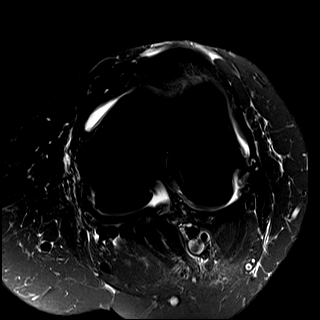
[im 23/33]
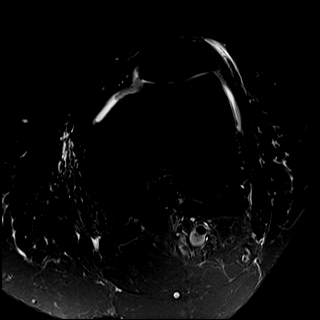
[im 28/33]
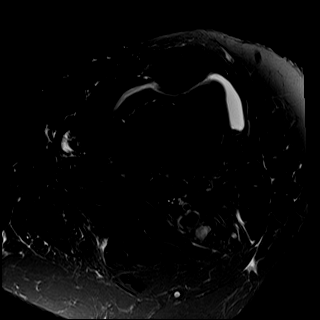
[im 33/33]
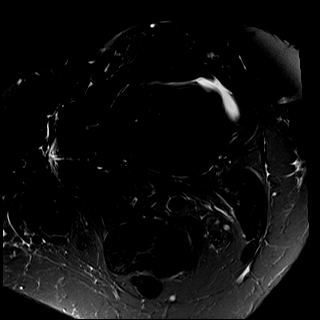

[Series 4: T2 fat-sat · coronal · left · 4.0mm · 0.50mm/px · 6 of 28 slices shown (2 of 3)]
[im 1/28]
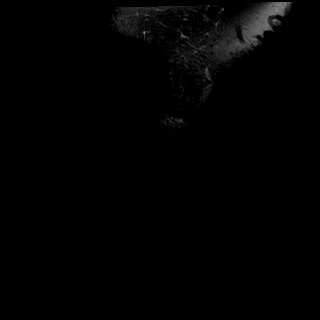
[im 6/28]
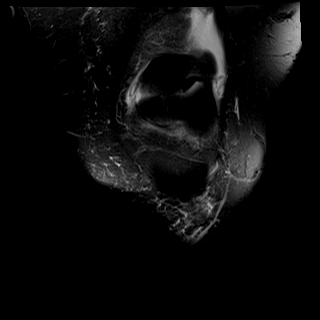
[im 11/28]
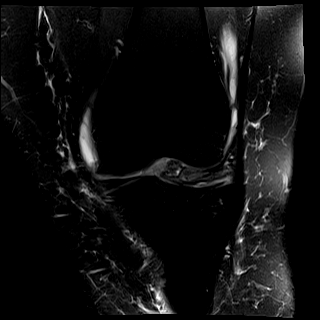
[im 17/28]
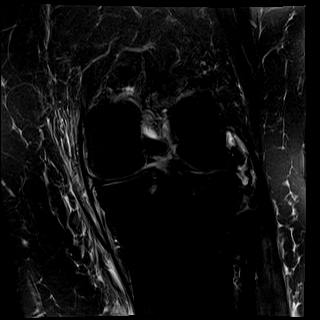
[im 22/28]
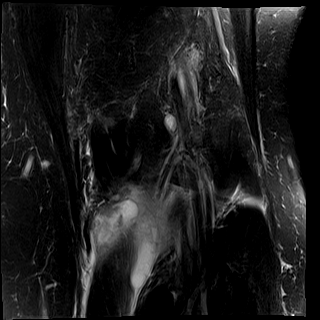
[im 28/28]
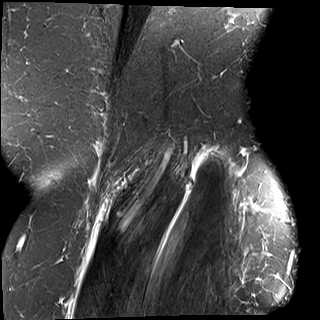

[Series 5: T1 · coronal · left · 4.0mm · 0.50mm/px · 6 of 28 slices shown]
[im 1/28]
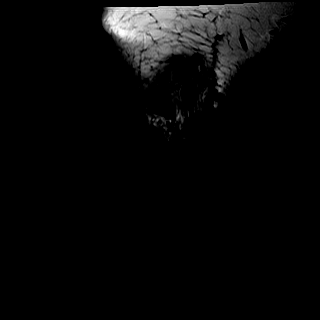
[im 6/28]
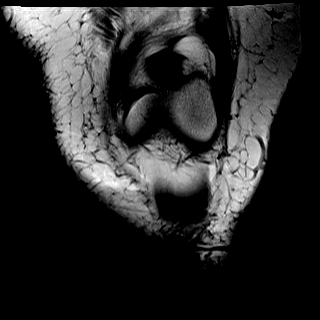
[im 11/28]
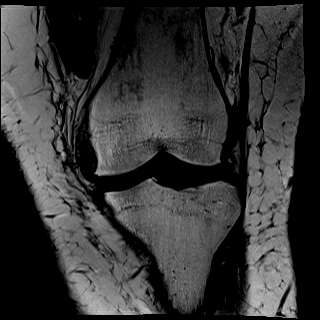
[im 17/28]
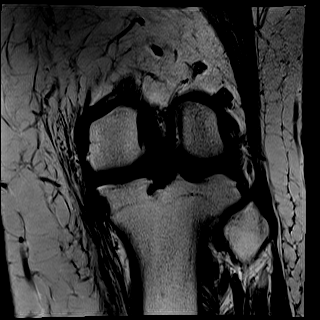
[im 22/28]
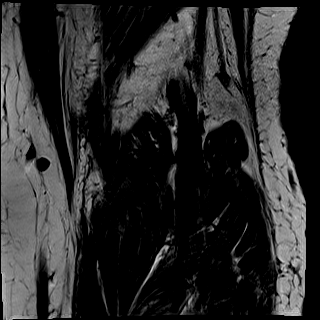
[im 28/28]
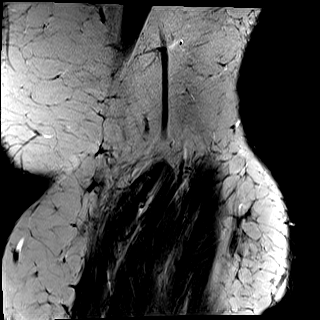

[Series 6: PD fat-sat · coronal · left · 4.0mm · 0.62mm/px · 6 of 28 slices shown (1 of 2)]
[im 1/28]
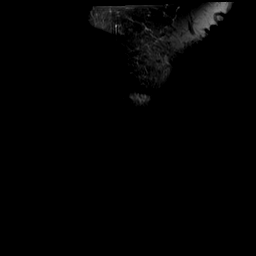
[im 6/28]
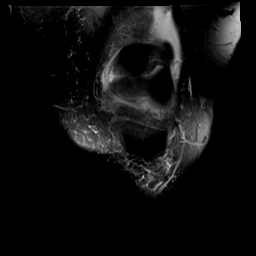
[im 11/28]
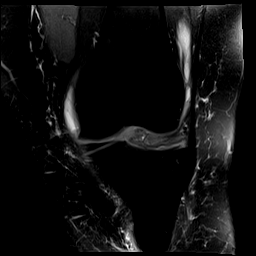
[im 17/28]
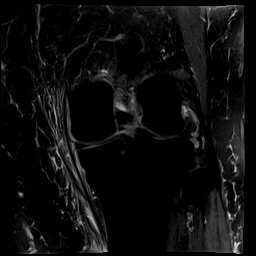
[im 22/28]
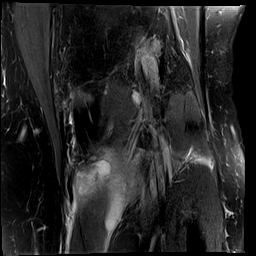
[im 28/28]
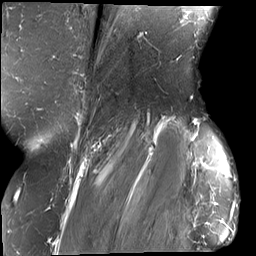

[Series 7: PD fat-sat · sagittal · left · 3.3mm · 0.50mm/px · 7 of 30 slices shown (2 of 2)]
[im 1/30]
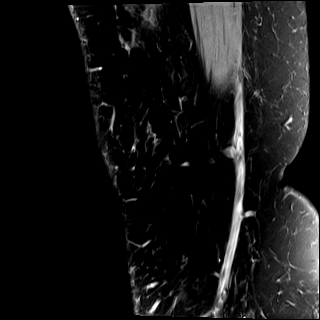
[im 5/30]
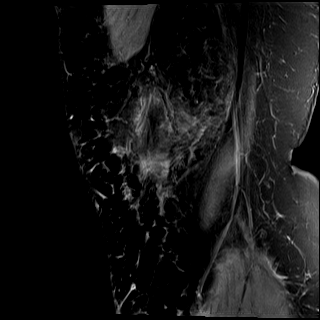
[im 10/30]
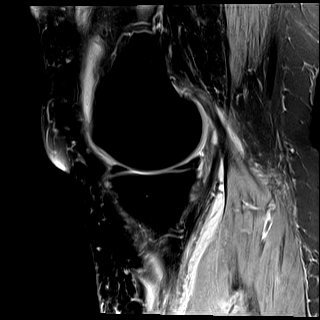
[im 15/30]
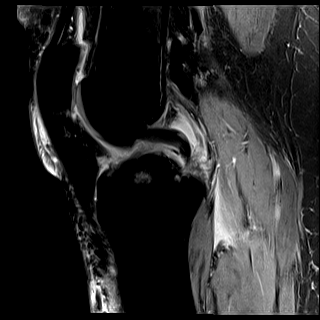
[im 20/30]
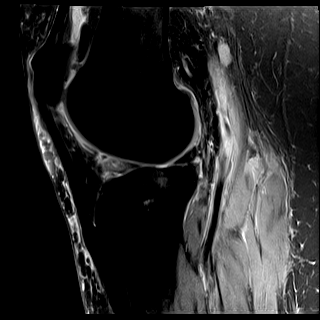
[im 25/30]
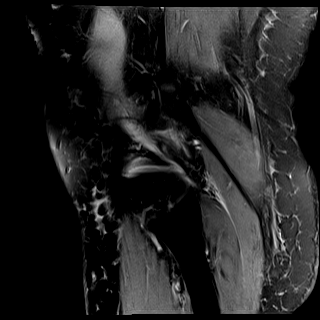
[im 30/30]
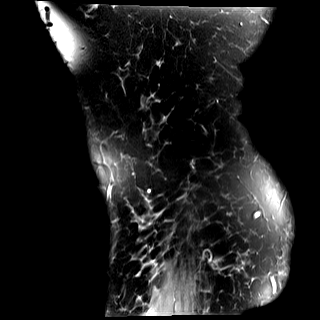

[Series 8: T2 fat-sat · sagittal · left · 3.3mm · 0.50mm/px · 7 of 30 slices shown (3 of 3)]
[im 1/30]
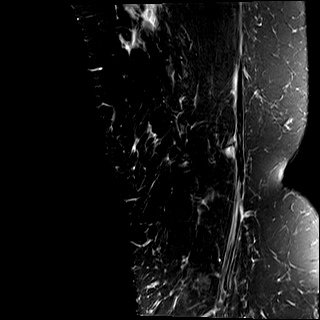
[im 5/30]
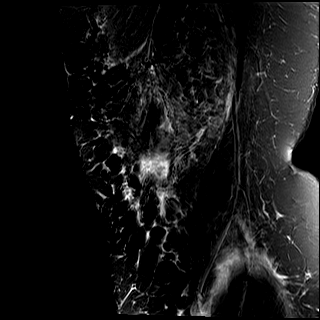
[im 10/30]
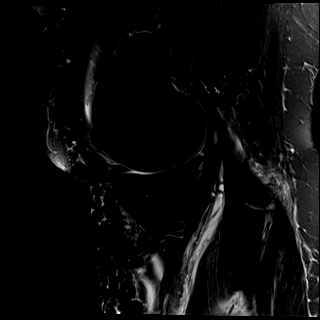
[im 15/30]
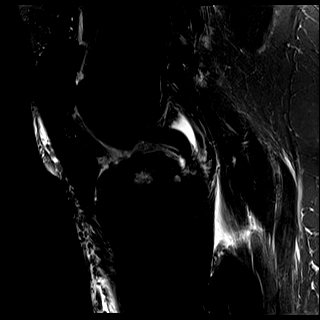
[im 20/30]
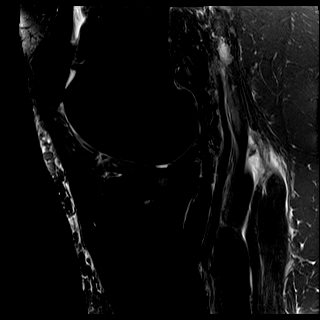
[im 25/30]
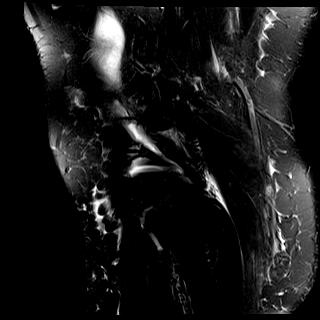
[im 30/30]
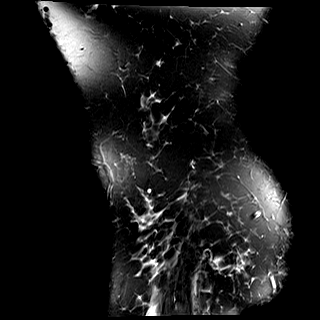

[40 of 40 positions shown; findings below may reference images not displayed]

FINDINGS: MENISCI

Medial meniscus: Mildly degenerated without evidence of tear. The
meniscal root is intact.

Lateral meniscus: Prominent degenerative signal within the anterior
horn with adjacent inflammatory changes anteriorly in the
intercondylar notch. There is a 6 mm cyst (image [DATE]) which may
reflect a ganglion, or less likely a parameniscal cyst. No displaced
meniscal fragment identified.

LIGAMENTS

Cruciates:  Intact.

Collaterals: Intact. MCL degeneration and a small amount of fluid in
the pes answering bursa are noted.

CARTILAGE

Patellofemoral: Mild patellar chondral thinning and surface
irregularity.

Medial:  Mild chondral thinning and surface irregularity.

Lateral: Moderate chondral thinning and surface irregularity with
subchondral edema centrally in the lateral tibial plateau. In
addition, there are probable intraosseous cysts centrally in the
proximal tibia adjacent to the tibial spines.

MISCELLANEOUS

Joint:  Small joint effusion.

Popliteal Fossa: Small Baker's cyst appears partially ruptured with
ill-defined edema tracking inferiorly into the lower leg
posteromedially. Small lymph nodes within the popliteal fossa are
not pathologically enlarged.

Extensor Mechanism:  Intact.

Bones:  No acute or significant extra-articular osseous findings.

Other: Mild subcutaneous edema surrounding the knee without
additional focal fluid collection.
IMPRESSION: 1. Prominent degenerative signal within the anterior horn of the
lateral meniscus with adjacent inflammatory changes anteriorly in
the intercondylar notch and probable small ganglion. No displaced
meniscal fragment identified.
2. Mild-to-moderate tricompartmental degenerative changes, greatest
in the lateral compartment. No acute osseous findings.
3. Small joint effusion and small partially ruptured Baker's cyst.
4. The medial meniscus, cruciate and collateral ligaments are
intact.

## 2020-09-08 IMAGING — MR MR KNEE*R* W/O CM
6 series · 40 of 40 positions shown · non-contrast
Comparison: Radiographs [DATE]

CLINICAL DATA: Bilateral knee pain for 1-2 months. No known injury,
prior relevant surgery or history of malignancy.

EXAM:
MRI OF THE RIGHT KNEE WITHOUT CONTRAST
TECHNIQUE: Multiplanar, multisequence MR imaging of the knee was performed. No
intravenous contrast was administered.

[Series 8: T2 fat-sat · axial · right · 4.0mm · 0.50mm/px · z∈[-56,+79]mm · 6 of 32 slices shown (1 of 3)]
[im 1/32]
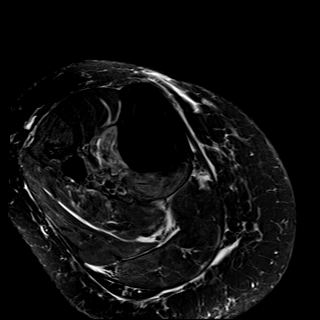
[im 7/32]
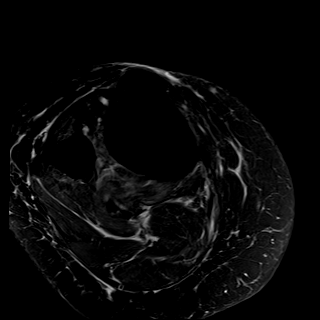
[im 13/32]
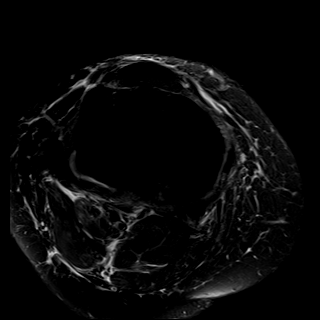
[im 19/32]
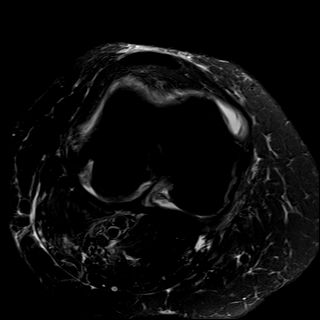
[im 25/32]
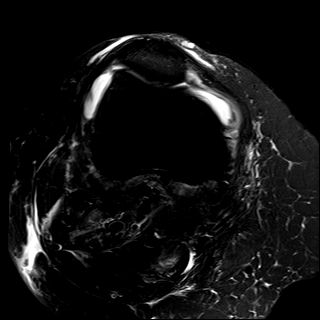
[im 32/32]
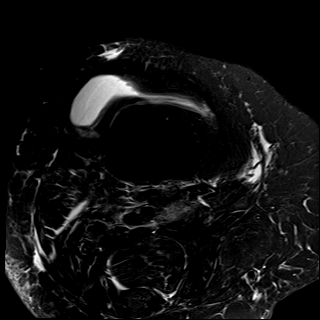

[Series 9: T2 fat-sat · coronal · right · 4.0mm · 0.50mm/px · 6 of 32 slices shown (2 of 3)]
[im 1/32]
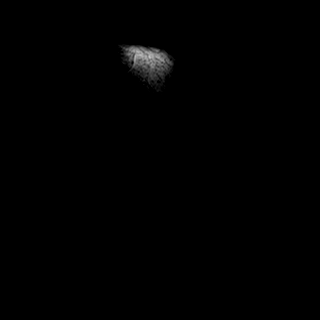
[im 7/32]
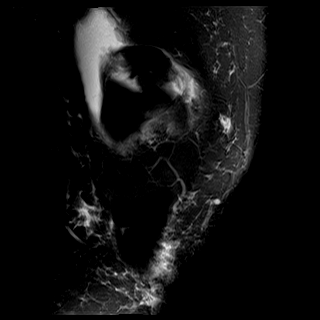
[im 13/32]
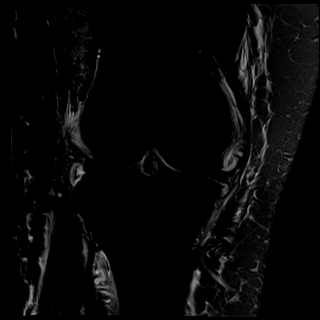
[im 19/32]
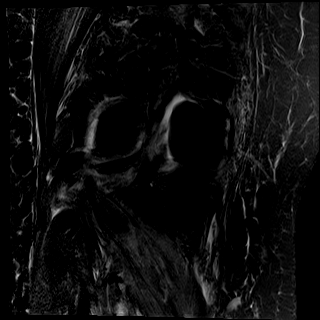
[im 25/32]
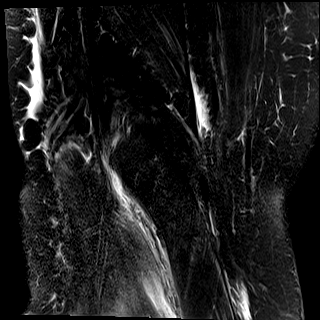
[im 32/32]
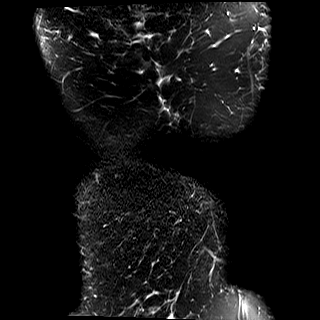

[Series 10: T1 · coronal · right · 3.5mm · 0.50mm/px · 7 of 34 slices shown]
[im 1/34]
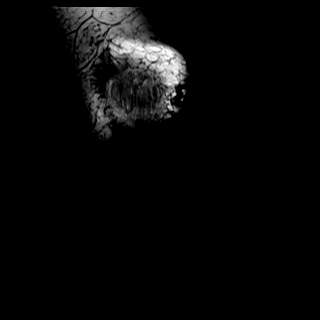
[im 6/34]
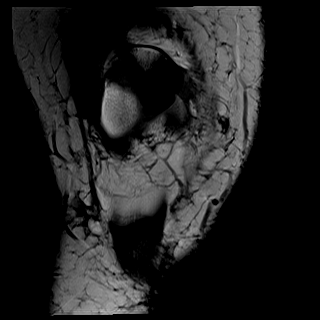
[im 12/34]
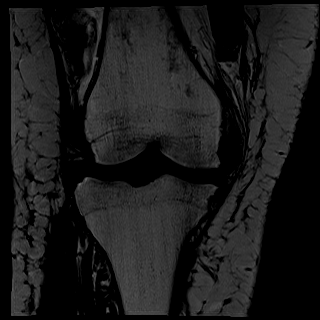
[im 17/34]
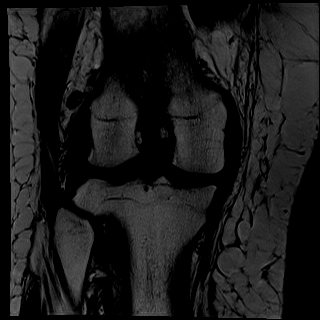
[im 23/34]
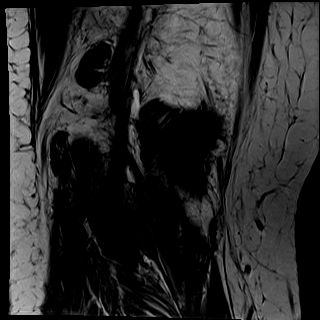
[im 28/34]
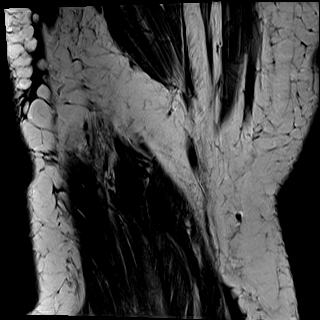
[im 34/34]
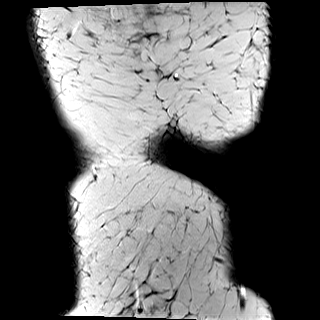

[Series 11: PD fat-sat · coronal · right · 3.3mm · 0.62mm/px · 7 of 34 slices shown (1 of 2)]
[im 1/34]
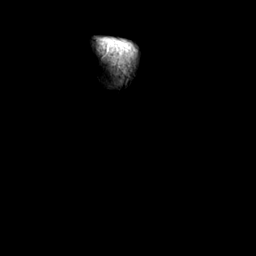
[im 6/34]
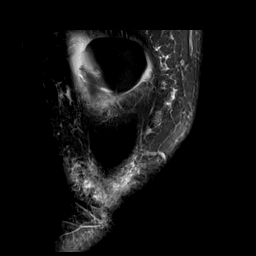
[im 12/34]
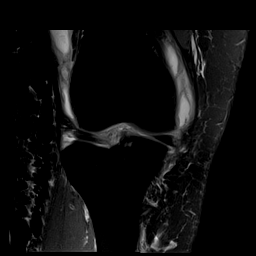
[im 17/34]
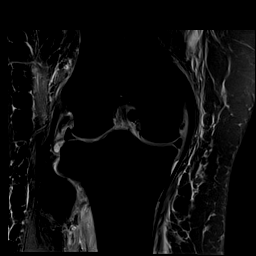
[im 23/34]
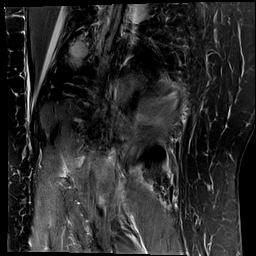
[im 28/34]
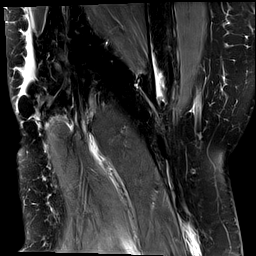
[im 34/34]
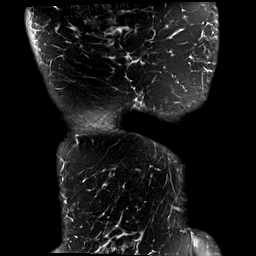

[Series 12: PD fat-sat · sagittal · right · 3.5mm · 0.50mm/px · 7 of 33 slices shown (2 of 2)]
[im 1/33]
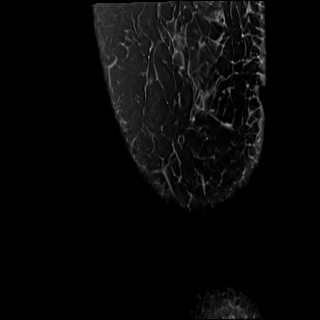
[im 6/33]
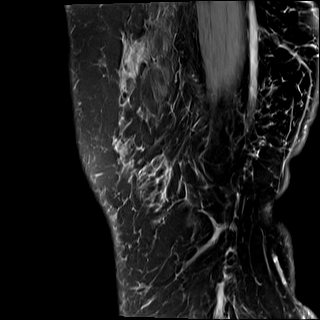
[im 11/33]
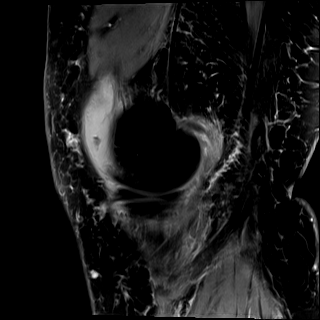
[im 17/33]
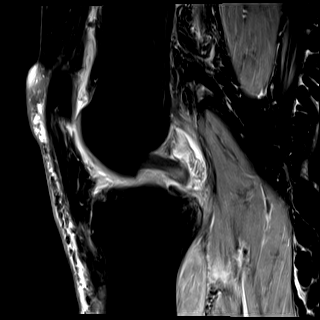
[im 22/33]
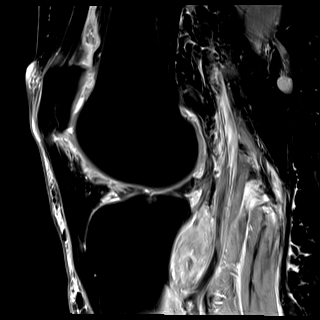
[im 27/33]
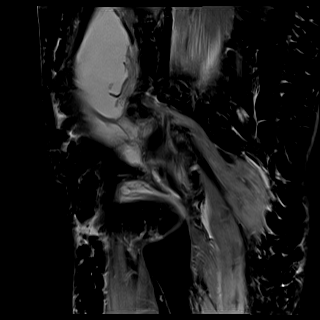
[im 33/33]
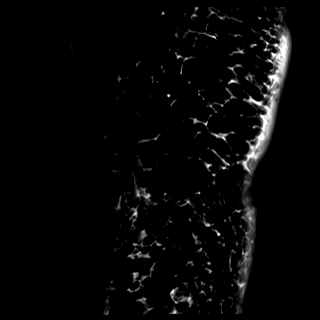

[Series 13: T2 fat-sat · sagittal · right · 3.5mm · 0.50mm/px · 7 of 33 slices shown (3 of 3)]
[im 1/33]
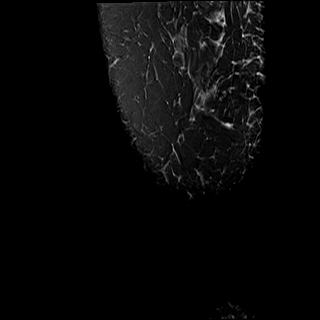
[im 6/33]
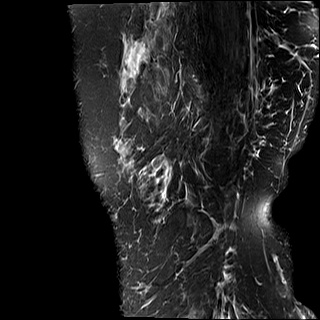
[im 11/33]
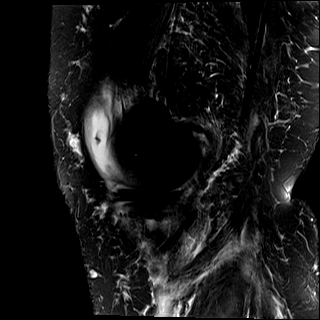
[im 17/33]
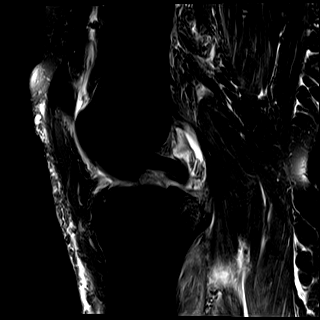
[im 22/33]
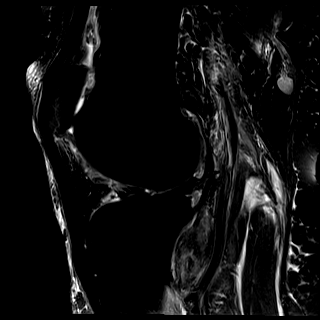
[im 27/33]
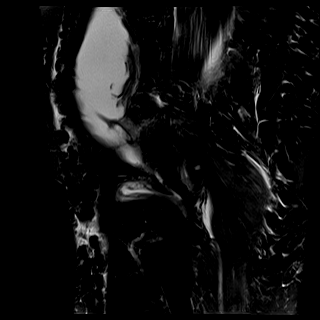
[im 33/33]
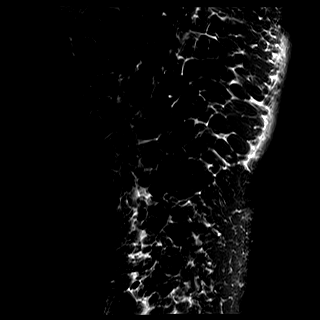

[40 of 40 positions shown; findings below may reference images not displayed]

FINDINGS: MENISCI

Medial meniscus: Mildly degenerated without evidence of tear. The
meniscal root is intact.

Lateral meniscus:  Mildly degenerated without evidence of tear.

LIGAMENTS

Cruciates:  Intact.

Collaterals:  Intact.

CARTILAGE

Patellofemoral: Mild patellar chondral thinning without focal
defect.

Medial: Mild chondral thinning and surface irregularity without
focal defect.

Lateral: Mild chondral thinning and surface irregularity with mild
subchondral cyst formation centrally in the lateral tibial plateau.

MISCELLANEOUS

Joint:  Small to moderate joint effusion with synovial irregularity.

Popliteal Fossa: There is generalized soft tissue edema and a small
Baker's cyst. There are several prominent lymph nodes posteriorly in
the distal right thigh, measuring up to 1.4 cm short axis on image
[DATE]. There are smaller, normal-sized lymph nodes in the opposite
knee.

Extensor Mechanism:  Intact.

Bones:  No acute or significant extra-articular osseous findings.

Other: Generalized soft tissue edema surrounding the knee.
IMPRESSION: 1. No acute findings or evidence of internal derangement. The
menisci, cruciate and collateral ligaments are intact.
2. Mild tricompartmental degenerative changes. No acute osseous
findings.
3. Small to moderate joint effusion with synovial irregularity
consistent with synovitis.
4. Prominent lymph nodes posteriorly in the distal right thigh,
likely reactive. Correlate clinically to exclude lymphoproliferative
disorder. These were not documented on lower extremity Doppler
ultrasound [DATE].

## 2020-09-14 ENCOUNTER — Ambulatory Visit: Payer: No Typology Code available for payment source | Admitting: Orthopaedic Surgery

## 2020-09-28 ENCOUNTER — Ambulatory Visit: Payer: No Typology Code available for payment source | Admitting: Orthopaedic Surgery

## 2020-10-05 ENCOUNTER — Ambulatory Visit: Payer: No Typology Code available for payment source | Admitting: Orthopaedic Surgery

## 2020-10-12 ENCOUNTER — Encounter: Payer: Self-pay | Admitting: Orthopaedic Surgery

## 2020-10-12 ENCOUNTER — Ambulatory Visit (INDEPENDENT_AMBULATORY_CARE_PROVIDER_SITE_OTHER): Payer: No Typology Code available for payment source | Admitting: Orthopaedic Surgery

## 2020-10-12 ENCOUNTER — Telehealth: Payer: Self-pay

## 2020-10-12 VITALS — Ht 71.0 in

## 2020-10-12 DIAGNOSIS — M25561 Pain in right knee: Secondary | ICD-10-CM

## 2020-10-12 DIAGNOSIS — M1712 Unilateral primary osteoarthritis, left knee: Secondary | ICD-10-CM | POA: Diagnosis not present

## 2020-10-12 DIAGNOSIS — M1711 Unilateral primary osteoarthritis, right knee: Secondary | ICD-10-CM | POA: Diagnosis not present

## 2020-10-12 DIAGNOSIS — M25562 Pain in left knee: Secondary | ICD-10-CM

## 2020-10-12 DIAGNOSIS — G8929 Other chronic pain: Secondary | ICD-10-CM

## 2020-10-12 NOTE — Telephone Encounter (Signed)
Bilateral gel injections  

## 2020-10-12 NOTE — Progress Notes (Signed)
The patient comes in today to go over MRIs of both her knees.  She is a very pleasant and tall 50 year old female with significant pain in both her knees.  Her right knee has been hurting just recently again after a slip on ice.  She does feel like the knee has an effusion.  Examination of both knees does show mild effusion of the right knee.  I did aspirate about 25 cc of fluid from that knee which was not worrisome appearing.  Both knees have global tenderness.  MRI both knees shows mild to moderate arthritis but no significant tearing.  There is no full-thickness cartilage loss at all.  Both sides have small Baker's cyst.  I do feel that she would benefit from hyaluronic acid for her knees.  She is trying Voltaren gel.  She is working on weight loss and activity modification which we have also recommended.  I recommend quad strengthening exercises as well.  All questions and concerns were answered and addressed.  She agrees with this treatment plan as well.  We will be in touch if this gets approved through insurance to provide hyaluronic acid injections for both knees to treat the pain from osteoarthritis of the knees.

## 2020-10-13 NOTE — Telephone Encounter (Signed)
Submitted for VOB for Durolane-Bilateral knee 

## 2020-10-15 ENCOUNTER — Telehealth: Payer: Self-pay

## 2020-10-15 NOTE — Telephone Encounter (Signed)
Pt's insurance requires a prior [place]. Paperwork completed and faxed to [employer]

## 2020-10-30 ENCOUNTER — Telehealth: Payer: Self-pay

## 2020-10-30 NOTE — Telephone Encounter (Signed)
Called and scheduled pt for Monday. Pt is aware of copay

## 2020-10-30 NOTE — Telephone Encounter (Signed)
Approved for Durolane-Bilateral knee Buy and bill Dr. Ninfa Linden $30 copay 15% OOP Auth # [id number] Dates: 10/15/20-10/15/21   Ok to schedule @ next available

## 2020-11-02 ENCOUNTER — Ambulatory Visit (INDEPENDENT_AMBULATORY_CARE_PROVIDER_SITE_OTHER): Payer: No Typology Code available for payment source | Admitting: Physician Assistant

## 2020-11-02 ENCOUNTER — Encounter: Payer: Self-pay | Admitting: Physician Assistant

## 2020-11-02 ENCOUNTER — Telehealth: Payer: Self-pay | Admitting: Podiatry

## 2020-11-02 DIAGNOSIS — M1712 Unilateral primary osteoarthritis, left knee: Secondary | ICD-10-CM | POA: Diagnosis not present

## 2020-11-02 DIAGNOSIS — M1711 Unilateral primary osteoarthritis, right knee: Secondary | ICD-10-CM

## 2020-11-02 MED ORDER — SODIUM HYALURONATE 60 MG/3ML IX PRSY
60.0000 mg | PREFILLED_SYRINGE | INTRA_ARTICULAR | Status: AC | PRN
  Administered 2020-11-02: 60 mg via INTRA_ARTICULAR

## 2020-11-02 NOTE — Telephone Encounter (Signed)
Pt left message stating she was molded for orthotics in November last yr and was scheduled to pick them up but that appt was cxled because orthotics were not in. She was expecting a call to r/s when they came in but never got one.  Upon checking an discussing with the lab they never received her impression. (lost by fedex possibly).  I called pt and apologized and she was scheduled to see Dr Amalia Hailey on 2.23 and I put a note in there to remold pt and we would get the orthotics rushed to [place]. She said ok.

## 2020-11-02 NOTE — Progress Notes (Signed)
   Procedure Note  Patient: Samantha Holden             Date of Birth: 1971/04/04           MRN: [id number]             Visit Date: 11/02/2020 HPI: Mrs. Mccalla comes in today for bilateral knee injections.  She is having trouble getting up and down due to knee pain.  No known injury.  States her left knee is worse at night right knee is very stiff during the day.  She takes Mobic for the knee pain.  Physical exam: Bilateral knees no abnormal warmth erythema.  Positive effusion left knee no effusion right knee.  Procedures: Visit Diagnoses:  1. Unilateral primary osteoarthritis, left knee   2. Unilateral primary osteoarthritis, right knee     Large Joint Inj: bilateral knee on 11/02/2020 12:08 PM Indications: pain Details: 22 G 1.5 in needle, anterolateral approach  Arthrogram: No  Medications (Right): 60 mg Sodium Hyaluronate 60 MG/3ML Medications (Left): 60 mg Sodium Hyaluronate 60 MG/3ML Aspirate (Left): 17 mL yellow Outcome: tolerated well, no immediate complications Procedure, treatment alternatives, risks and benefits explained, specific risks discussed. Consent was given by the patient. Immediately prior to procedure a time out was called to verify the correct patient, procedure, equipment, support staff and site/side marked as required. Patient was prepped and draped in the usual sterile fashion.     Plan: Patient understands that the supplemental injections can take up to 8 weeks to work.  She also understands that she can only have the injections every 6 months.  She will follow-up with [place] as needed.  Questions were encouraged and answered at length today.  Patient tolerated procedures well.

## 2020-11-03 ENCOUNTER — Other Ambulatory Visit: Payer: Self-pay | Admitting: Podiatry

## 2020-11-03 NOTE — Telephone Encounter (Signed)
Please advise 

## 2020-11-04 ENCOUNTER — Other Ambulatory Visit: Payer: Self-pay

## 2020-11-04 ENCOUNTER — Ambulatory Visit (INDEPENDENT_AMBULATORY_CARE_PROVIDER_SITE_OTHER): Payer: No Typology Code available for payment source | Admitting: Podiatry

## 2020-11-04 DIAGNOSIS — M19071 Primary osteoarthritis, right ankle and foot: Secondary | ICD-10-CM | POA: Diagnosis not present

## 2020-11-04 DIAGNOSIS — M2141 Flat foot [pes planus] (acquired), right foot: Secondary | ICD-10-CM

## 2020-11-04 DIAGNOSIS — M7751 Other enthesopathy of right foot: Secondary | ICD-10-CM

## 2020-11-04 DIAGNOSIS — M2142 Flat foot [pes planus] (acquired), left foot: Secondary | ICD-10-CM | POA: Diagnosis not present

## 2020-11-04 MED ORDER — BETAMETHASONE SOD PHOS & ACET 6 (3-3) MG/ML IJ SUSP: 12.0000 mg | Freq: Once | INTRAMUSCULAR | Status: DC

## 2020-11-04 MED ORDER — BETAMETHASONE SOD PHOS & ACET 6 (3-3) MG/ML IJ SUSP: 3.0000 mg | Freq: Once | INTRAMUSCULAR | Status: DC

## 2020-11-04 NOTE — Progress Notes (Signed)
   Subjective:  50 y.o. female presenting today with a new complaint regarding right foot and ankle pain.  Patient continues to have chronic pain to the bilateral ankles right greater than the left secondary to pes planus deformity.  She states that she also has knee pain.  She most recently got some knee injections which helped minimally. Last visit she was scanned and molded for new orthotics however it was lost in transit.  So today she is going to meet with the Pedorthist to be rescanned in remolding for new orthotics   Past Medical History:  Diagnosis Date  . Allergy   . Chicken pox      Objective / Physical Exam:  General:  The patient is alert and oriented x3 in no acute distress. Dermatology:  Skin is warm, dry and supple bilateral lower extremities. Negative for open lesions or macerations. Vascular:  Palpable pedal pulses bilaterally. No edema or erythema noted. Capillary refill within normal limits. Neurological:  Epicritic and protective threshold grossly intact bilaterally.  Musculoskeletal Exam:  Pain on palpation to the anterior lateral medial aspects of the patient's right ankle. Mild edema noted. Range of motion within normal limits to all pedal and ankle joints bilateral. Muscle strength 5/5 in all groups bilateral.   Assessment: 1.  DJD/capsulitis right ankle 2.  Pes planus bilateral  Plan of Care:  1. Patient was evaluated.  Today we discussed different treatment modalities and options both surgical and conservative.  At the moment we are going to pursue conservative treatment 2. Injection of 0.5 mL Celestone Soluspan injected in the patient's right ankle. 3.  Patient states that she would like to use an ankle brace for the left ankle.  Ankle brace dispensed today 4.  Patient is seeing our Pedorthist today to be rescanned and remolded for custom orthotics 5.  Return to clinic as needed  Edrick Kins, DPM [facility]  Dr. Edrick Kins, DPM     [address], [place] [address]                Office [phone]  Fax [phone]

## 2020-11-06 ENCOUNTER — Telehealth: Payer: Self-pay | Admitting: Podiatry

## 2020-11-06 NOTE — Telephone Encounter (Signed)
Pt called stating the pharmacy needs your authorization in order to fill the meloxicam. Please advise.

## 2020-11-10 ENCOUNTER — Encounter: Payer: Self-pay | Admitting: Podiatry

## 2020-11-12 ENCOUNTER — Other Ambulatory Visit: Payer: Self-pay | Admitting: Podiatry

## 2020-11-12 MED ORDER — MELOXICAM 15 MG PO TABS: 15.0000 mg | ORAL_TABLET | Freq: Every day | ORAL | 1 refills | Status: DC

## 2020-12-02 ENCOUNTER — Other Ambulatory Visit: Payer: Self-pay

## 2020-12-02 ENCOUNTER — Ambulatory Visit (INDEPENDENT_AMBULATORY_CARE_PROVIDER_SITE_OTHER): Payer: No Typology Code available for payment source | Admitting: *Deleted

## 2020-12-02 DIAGNOSIS — M2142 Flat foot [pes planus] (acquired), left foot: Secondary | ICD-10-CM

## 2020-12-02 DIAGNOSIS — M7751 Other enthesopathy of right foot: Secondary | ICD-10-CM

## 2020-12-02 DIAGNOSIS — M19071 Primary osteoarthritis, right ankle and foot: Secondary | ICD-10-CM

## 2020-12-02 DIAGNOSIS — M2141 Flat foot [pes planus] (acquired), right foot: Secondary | ICD-10-CM

## 2020-12-02 NOTE — Progress Notes (Signed)
Patient presents today to pick up custom molded foot orthotics, diagnosed with pes planus by Dr. Amalia Hailey.   Orthotics were dispensed and fit was satisfactory. Reviewed instructions for break-in and wear. Written instructions given to patient.  Patient will follow up as needed.   [facility] - order # [id number]

## 2020-12-21 ENCOUNTER — Encounter: Payer: Self-pay | Admitting: Orthopaedic Surgery

## 2020-12-21 ENCOUNTER — Ambulatory Visit (INDEPENDENT_AMBULATORY_CARE_PROVIDER_SITE_OTHER): Payer: No Typology Code available for payment source | Admitting: Orthopaedic Surgery

## 2020-12-21 ENCOUNTER — Telehealth: Payer: Self-pay | Admitting: Orthopaedic Surgery

## 2020-12-21 VITALS — Ht 71.0 in | Wt 290.0 lb

## 2020-12-21 DIAGNOSIS — Z6841 Body Mass Index (BMI) 40.0 and over, adult: Secondary | ICD-10-CM | POA: Diagnosis not present

## 2020-12-21 DIAGNOSIS — M25561 Pain in right knee: Secondary | ICD-10-CM

## 2020-12-21 DIAGNOSIS — M25562 Pain in left knee: Secondary | ICD-10-CM | POA: Diagnosis not present

## 2020-12-21 DIAGNOSIS — G8929 Other chronic pain: Secondary | ICD-10-CM

## 2020-12-21 NOTE — Telephone Encounter (Signed)
Patient submitted medical release form, FMLA, and $25.00 cash payment to [employer]. Accepted 12/21/20

## 2020-12-21 NOTE — Addendum Note (Signed)
Addended by: Lendon Collar on: 12/21/2020 04:19 PM   Modules accepted: Orders

## 2020-12-21 NOTE — Progress Notes (Signed)
The patient is very well-known to [place].  She is a 50 year old female with chronic bilateral knee pain.  She has a hard time getting her both her knees fully extended.  We have tried steroid injections in both knees and hyaluronic acid.  None of this is really helped.  She has been working from home and needs accommodations for being able to continue to just work from home unless her job allows her to use a walker or wheelchair to get all the way down a long-haul to her work for sitdown job which I am fine with.  Also fine with her continue to work from home.  MRIs of both knees show mild arthritis in the right knee and mild to moderate arthritis of the left knee with no meniscal tearing.  She is someone who has a BMI of over 40 as well.  She has been dealing with chronic knee pain for a while.  She says is hard to really get her knee straight and she sleeps with a pillow behind her knees at night.  She has been taking meloxicam and Tylenol arthritis.  She does use a peddling type of machine at home.  Both knees were examined and they do lack full extension by just a few degrees and there is patellofemoral crepitation.  Both knees have just a mild effusion.  Both knees flex easily and are ligamentously stable.  I did again look to the MRI of both knees and there is not really a surgical intervention to offer for her knees.  I did counsel her about weight loss.  It is worth trying outpatient physical therapy at this point to strengthen the muscles around her knees and trying any modalities they can help decrease her pain.  We can see her back in 2 months to see how she is doing overall.  She agrees with this treatment plan.

## 2020-12-24 ENCOUNTER — Telehealth: Payer: Self-pay | Admitting: Orthopaedic Surgery

## 2020-12-24 NOTE — Telephone Encounter (Signed)
Patient called advised her HR director emailed her stating the medical restriction check list was incomplete. Patient said the continuous/intermittent should have something xhecked in each box. Patient asked if it can be completed and faxed again. The number to contact patient is [phone]

## 2021-01-07 ENCOUNTER — Other Ambulatory Visit: Payer: Self-pay | Admitting: Podiatry

## 2021-01-20 ENCOUNTER — Ambulatory Visit: Payer: No Typology Code available for payment source | Attending: Orthopaedic Surgery

## 2021-01-20 ENCOUNTER — Other Ambulatory Visit: Payer: Self-pay

## 2021-01-20 DIAGNOSIS — M25562 Pain in left knee: Secondary | ICD-10-CM | POA: Insufficient documentation

## 2021-01-20 DIAGNOSIS — G8929 Other chronic pain: Secondary | ICD-10-CM | POA: Diagnosis present

## 2021-01-20 DIAGNOSIS — R2681 Unsteadiness on feet: Secondary | ICD-10-CM

## 2021-01-20 DIAGNOSIS — M25561 Pain in right knee: Secondary | ICD-10-CM | POA: Diagnosis not present

## 2021-01-20 DIAGNOSIS — M6281 Muscle weakness (generalized): Secondary | ICD-10-CM | POA: Diagnosis present

## 2021-01-21 NOTE — Therapy (Signed)
[place], [place], [address] Phone: [phone]   Fax:  [phone]  Physical Therapy Evaluation  Patient Details  Name: Samantha Holden MRN: [id number] Date of Birth: 04/24/1971 Referring Provider (PT): Mcarthur Rossetti, MD   Encounter Date: 01/20/2021   PT End of Session - 01/20/21 1613    Visit Number 1    Number of Visits 17    Date for PT Re-Evaluation 03/18/21    Authorization Type UHC    PT Start Time 8416    PT Stop Time 1700    PT Time Calculation (min) 45 min    Activity Tolerance Patient tolerated treatment well;No increased pain    Behavior During Therapy WFL for tasks assessed/performed           Past Medical History:  Diagnosis Date  . Allergy   . Chicken pox     Past Surgical History:  Procedure Laterality Date  . DILATION AND CURETTAGE, DIAGNOSTIC / THERAPEUTIC  1992  . WISDOM TOOTH EXTRACTION      There were no vitals filed for this visit.    Subjective Assessment - 01/20/21 1618    Subjective Pt is a pleasant 50 y/o F who presents to PT with reports of chronic bilateral knee pain. Pt notes bilateral knee pain that does not allow her to stand, as she arrives in wheelchair. She notes that she has a lot of pain at night when trying to sleep and get comfortable. Pain started in November 2021 after traveling for Thanksgiving. She notes that after she got back form [place] she had increased swelling and it has been a chronic issue since. No MOI and intermittent/gradual onset of pain. States that after her covid shot, she notes that her bilateral feet started to go numb and she can still feel where the shot was insert. Pt denies any other paresthesias, bowel/bladder changes, or saddle anesthesia.    Limitations Standing;Sitting;Walking    How long can you sit comfortably? 45-60 minutes    How long can you stand comfortably? 5 minutes    How long can you walk comfortably? 1-2  minutes    Patient Stated Goals Pt wants to decrease bilateral knee pain in order to get back to walking and improve mobility    Currently in Pain? No/denies    Pain Score 0-No pain   10/10 at worst in last two weeks   Pain Location Knee    Pain Orientation Right;Left;Lateral;Posterior    Pain Descriptors / Indicators Burning;Throbbing    Pain Type Chronic pain    Pain Onset More than a month ago    Pain Frequency Intermittent    Aggravating Factors  standing, laying flat, walking    Pain Relieving Factors ice, heat              [facility] PT Assessment - 01/21/21 0001      Assessment   Medical Diagnosis M25.562,G89.29 (ICD-10-CM) - Chronic pain of left knee;   M25.561,G89.29 (ICD-10-CM) - Chronic pain of right knee    Referring Provider (PT) Mcarthur Rossetti, MD    Hand Dominance Right      Precautions   Precautions None      Restrictions   Weight Bearing Restrictions No      Balance Screen   Has the patient fallen in the past 6 months No    Has the patient had a decrease in activity level because of a fear of falling?  Yes  Is the patient reluctant to leave their home because of a fear of falling?  No      Home Social worker Private residence    Living Arrangements Spouse/significant other;Children    Type of [place] Access Level entry    Home Layout One level    Home Equipment Wheelchair - manual;Walker - 2 wheels;Cane - single point      Prior Function   Level of Independence Independent    Vocation Full time employment    Vocation Requirements HR for [employer]      Observation/Other Assessments   Focus on Therapeutic Outcomes (FOTO)  26% function      Sensation   Light Touch Appears Intact      AROM   Right Knee Extension 20    Right Knee Flexion 103    Left Knee Extension 18    Left Knee Flexion 108      Strength   Right Hip Flexion 3+/5    Right Hip ABduction 3+/5    Right Hip ADduction 3+/5    Left Hip  Flexion 3+/5    Left Hip ABduction 3+/5    Left Hip ADduction 3+/5    Right Knee Flexion 4/5    Right Knee Extension 3+/5    Left Knee Flexion 4/5    Left Knee Extension 3+/5   pain     Palpation   Palpation comment TTP to L lateral knee and joint line      Transfers   Transfers Stand Pivot Transfers;Sit to Stand;Stand to Sit    Sit to Stand 7: Independent    Five time sit to stand comments  49 seconds    Stand to Sit 7: Independent    Stand Pivot Transfers 5: Supervision      Ambulation/Gait   Ambulation/Gait Yes    Ambulation/Gait Assistance 5: Supervision    Ambulation Distance (Feet) 5 Feet    Assistive device None    Gait Pattern Decreased step length - right;Decreased step length - left;Decreased stance time - right;Decreased stance time - left;Antalgic;Trunk flexed                      Objective measurements completed on examination: See above findings.       [facility] Adult PT Treatment/Exercise - 01/21/21 0001      Exercises   Exercises Knee/Hip      Knee/Hip Exercises: Stretches   Other Knee/Hip Stretches seated hamstring stretch x 30 sec ea      Knee/Hip Exercises: Seated   Long Arc Quad 5 reps;Both    Other Seated Knee/Hip Exercises clamshell x 15 green tband      Knee/Hip Exercises: Supine   Quad Sets 5 reps;Both    Quad Sets Limitations towel under knees                  PT Education - 01/21/21 0858    Education Details eval finings, POC, HEP    Person(s) Educated Patient    Methods Explanation;Demonstration;Handout    Comprehension Verbalized understanding;Returned demonstration            PT Short Term Goals - 01/21/21 0910      PT SHORT TERM GOAL #1   Title Pt will be independent with initial HEP    Baseline initial HEP given    Time 3    Period Weeks    Status New    Target Date  02/11/21             PT Long Term Goals - 01/21/21 0911      PT LONG TERM GOAL #1   Title Pt will decrease 5xSTS to no greater  than 25 seconds in order to decrease fall risk and improve mobility    Baseline 49 seconds    Time 8    Period Weeks    Status New    Target Date 03/18/21      PT LONG TERM GOAL #2   Title Pt will improve bilateral knee AROM to in range of 5-115 degrees in order to improve mobility    Baseline see flowsheet    Time 8    Period Weeks    Status New    Target Date 03/18/21      PT LONG TERM GOAL #3   Title Pt will improve FOTO function score to no less than 55% in order to improve confidence and functional ability    Baseline 26% function    Time 8    Period Weeks    Status New    Target Date 03/18/21      PT LONG TERM GOAL #4   Title Pt will improve all LE MMT to no less than 4+/5 in order to improve mobility    Baseline see flowsheet    Time 8    Period Weeks    Status New    Target Date 03/18/21                  Plan - 01/21/21 0859    Clinical Impression Statement Pt is a pleasant 50 y/o F who presents to PT with reports of chronic bilateral knee pain. Physical findings are consistent with MD impression, as pt presents with decreased bilateral knee ROM, reduced mobility as she arrives in w/c, and significant bilateral LE weakness. Her 5xSTS places her at a very high risk for falls and shows reduced functional mobility. Likewise, her FOTO score of 26% indicates significant decrease in functional ability compared to PLOF, with pt benefiting from skilled PT services working on increasing bilateral knee ROM and general LE strength in order in order to decrease pain and improve function.    Personal Factors and Comorbidities Fitness;Time since onset of injury/illness/exacerbation    Examination-Activity Limitations Sit;Squat;Stairs;Stand;Lift;Locomotion Level;Carry    Examination-Participation Restrictions [employer];Shop;Driving;Occupation;Community Activity    Stability/Clinical Decision Making Stable/Uncomplicated    Clinical Decision Making Low    Rehab  Potential Good    PT Frequency 2x / week    PT Duration 8 weeks    PT Treatment/Interventions ADLs/Self Care Home Management;Electrical Stimulation;Cryotherapy;Moist Heat;Gait training;Stair training;Functional mobility training;Therapeutic activities;Therapeutic exercise;Neuromuscular re-education;Patient/family education;Manual techniques;Passive range of motion;Dry needling;Taping    PT Next Visit Plan assess gait and perform TUG with AD as needed; assess response to HEP and progress as able    PT Home Exercise Plan Access Code [email or web address]    Consulted and Agree with Plan of Care Patient           Patient will benefit from skilled therapeutic intervention in order to improve the following deficits and impairments:  Abnormal gait,Decreased activity tolerance,Decreased balance,Decreased endurance,Decreased mobility,Decreased range of motion,Decreased strength,Difficulty walking,Pain  Visit Diagnosis: Chronic pain of right knee - Plan: PT plan of care cert/re-cert  Chronic pain of left knee - Plan: PT plan of care cert/re-cert  Muscle weakness (generalized) - Plan: PT plan of care cert/re-cert  Unsteadiness on feet - Plan: PT plan  of care cert/re-cert     Problem List Patient Active Problem List   Diagnosis Date Noted  . Symptomatic mammary hypertrophy 04/28/2020  . Shoulder pain 04/28/2020  . Back pain 04/28/2020  . Impingement syndrome of left ankle 12/30/2019  . Prediabetes 12/23/2019  . Right ankle pain 02/16/2018    Ward Chatters, PT, DPT 01/21/21 9:15 AM  [facility] [address] [place], [place], [address] Phone: [phone]   Fax:  [phone]  Name: Samantha Holden MRN: [id number] Date of Birth: Oct 10, 1970

## 2021-01-26 ENCOUNTER — Other Ambulatory Visit: Payer: Self-pay

## 2021-01-26 ENCOUNTER — Ambulatory Visit: Payer: No Typology Code available for payment source

## 2021-01-26 DIAGNOSIS — G8929 Other chronic pain: Secondary | ICD-10-CM

## 2021-01-26 DIAGNOSIS — R2681 Unsteadiness on feet: Secondary | ICD-10-CM

## 2021-01-26 DIAGNOSIS — M25562 Pain in left knee: Secondary | ICD-10-CM

## 2021-01-26 DIAGNOSIS — M25561 Pain in right knee: Secondary | ICD-10-CM

## 2021-01-26 DIAGNOSIS — M6281 Muscle weakness (generalized): Secondary | ICD-10-CM

## 2021-01-26 NOTE — Therapy (Signed)
[place], [place], [address] Phone: [phone]   Fax:  [phone]  Physical Therapy Treatment  Patient Details  Name: Samantha Holden MRN: [id number] Date of Birth: [birth year] Referring Provider (PT): Mcarthur Rossetti, MD   Encounter Date: 01/26/2021   PT End of Session - 01/26/21 1827    Visit Number 2    Number of Visits 17    Date for PT Re-Evaluation 03/18/21    Authorization Type UHC    PT Start Time 4970    PT Stop Time 1912    PT Time Calculation (min) 42 min    Activity Tolerance Patient tolerated treatment well;No increased pain    Behavior During Therapy WFL for tasks assessed/performed           Past Medical History:  Diagnosis Date  . Allergy   . Chicken pox     Past Surgical History:  Procedure Laterality Date  . DILATION AND CURETTAGE, DIAGNOSTIC / THERAPEUTIC  1992  . WISDOM TOOTH EXTRACTION      There were no vitals filed for this visit.   Subjective Assessment - 01/26/21 1827    Subjective Pt presents to PT with reports of continued bilateral knee pain and discomfort. She has been compliant with her HEP with no adverse effect. Pt is ready to begin PT treatment at this time.    Currently in Pain? Yes    Pain Score 5     Pain Location Knee    Pain Orientation Right;Left              [facility] PT Assessment - 01/26/21 0001      Standardized Balance Assessment   Standardized Balance Assessment Timed Up and Go Test      Timed Up and Go Test   Normal TUG (seconds) 70   with FWW                        [facility] Adult PT Treatment/Exercise - 01/26/21 0001      Ambulation/Gait   Ambulation/Gait Assistance 5: Supervision    Ambulation/Gait Assistance Details 15    Gait Comments pt ambulates with bilateral decrease knee ext and significant trunk flexion along with heavy reliance on FWW      Knee/Hip Exercises: Stretches   Other Knee/Hip Stretches  seated hamstring stretch x 30 sec ea      Knee/Hip Exercises: Aerobic   Nustep lvl 5 x 5 min while taking subjective      Knee/Hip Exercises: Seated   Long Arc Quad 2 sets;10 reps;Both;Weights    Long Arc Quad Weight 2 lbs.    Heel Slides 10 reps;Both      Knee/Hip Exercises: Supine   Bridges 2 sets;10 reps    Straight Leg Raises 10 reps;Right   Left LE w/ inc pain   Other Supine Knee/Hip Exercises supine clamshell 2x15 blue tband                  PT Education - 01/26/21 1916    Education Details HEP    Person(s) Educated Patient    Methods Explanation;Demonstration;Handout    Comprehension Verbalized understanding;Returned demonstration            PT Short Term Goals - 01/21/21 0910      PT SHORT TERM GOAL #1   Title Pt will be independent with initial HEP    Baseline initial HEP given    Time 3  Period Weeks    Status New    Target Date 02/11/21             PT Long Term Goals - 01/26/21 1920      PT LONG TERM GOAL #1   Title Pt will decrease 5xSTS to no greater than 25 seconds in order to decrease fall risk and improve mobility    Baseline 49 seconds    Time 8    Period Weeks    Status New    Target Date 03/18/21      PT LONG TERM GOAL #2   Title Pt will improve bilateral knee AROM to in range of 5-115 degrees in order to improve mobility    Baseline see flowsheet    Time 8    Period Weeks    Status New    Target Date 03/18/21      PT LONG TERM GOAL #3   Title Pt will improve FOTO function score to no less than 55% in order to improve confidence and functional ability    Baseline 26% function    Time 8    Period Weeks    Status New    Target Date 03/18/21      PT LONG TERM GOAL #4   Title Pt will improve all LE MMT to no less than 4+/5 in order to improve mobility    Baseline see flowsheet    Time 8    Period Weeks    Status New    Target Date 03/18/21      PT LONG TERM GOAL #5   Title Pt will decrease TUG with LRAD to no  greater than 25 seconds in order to improve functional mobility and decrease fall risk    Baseline 70 seconds    Time 8    Period Weeks    Status New    Target Date 03/18/21                 Plan - 01/26/21 1916    Clinical Impression Statement Pt was able to complete prescribed exercises with no adverse effect. She continues to have limited functional mobility and reduce gait tolerance, with TUG using FWW at 70 seconds and LTG added. Pt continues to benefit from skilled PT services working on improivng LE strength and knee AROM. Will conitnue to progress as able.    Personal Factors and Comorbidities Fitness;Time since onset of injury/illness/exacerbation    Examination-Activity Limitations Sit;Squat;Stairs;Stand;Lift;Locomotion Level;Carry    Examination-Participation Restrictions [employer];Shop;Driving;Occupation;Community Activity    Stability/Clinical Decision Making Stable/Uncomplicated    Rehab Potential Good    PT Frequency 2x / week    PT Duration 8 weeks    PT Treatment/Interventions ADLs/Self Care Home Management;Electrical Stimulation;Cryotherapy;Moist Heat;Gait training;Stair training;Functional mobility training;Therapeutic activities;Therapeutic exercise;Neuromuscular re-education;Patient/family education;Manual techniques;Passive range of motion;Dry needling;Taping    PT Next Visit Plan continue to assess gait and progress LE strengthening and knee AROM as able    PT Home Exercise Plan Access Code [email or web address]    Consulted and Agree with Plan of Care Patient           Patient will benefit from skilled therapeutic intervention in order to improve the following deficits and impairments:  Abnormal gait,Decreased activity tolerance,Decreased balance,Decreased endurance,Decreased mobility,Decreased range of motion,Decreased strength,Difficulty walking,Pain  Visit Diagnosis: Chronic pain of right knee  Chronic pain of left knee  Muscle weakness  (generalized)  Unsteadiness on feet     Problem List Patient Active Problem List  Diagnosis Date Noted  . Symptomatic mammary hypertrophy 04/28/2020  . Shoulder pain 04/28/2020  . Back pain 04/28/2020  . Impingement syndrome of left ankle 12/30/2019  . Prediabetes 12/23/2019  . Right ankle pain 02/16/2018    Ward Chatters, PT, DPT 01/26/21 7:24 PM  [place] [facility] [address] [place], [place], [address] Phone: [phone]   Fax:  [phone]  Name: Samantha Holden MRN: [id number] Date of Birth: [birth year]

## 2021-02-01 ENCOUNTER — Other Ambulatory Visit: Payer: Self-pay

## 2021-02-01 ENCOUNTER — Encounter: Payer: Self-pay | Admitting: Physical Therapy

## 2021-02-01 ENCOUNTER — Ambulatory Visit: Payer: No Typology Code available for payment source | Admitting: Physical Therapy

## 2021-02-01 DIAGNOSIS — R2681 Unsteadiness on feet: Secondary | ICD-10-CM

## 2021-02-01 DIAGNOSIS — M25561 Pain in right knee: Secondary | ICD-10-CM

## 2021-02-01 DIAGNOSIS — G8929 Other chronic pain: Secondary | ICD-10-CM

## 2021-02-01 DIAGNOSIS — M6281 Muscle weakness (generalized): Secondary | ICD-10-CM

## 2021-02-01 NOTE — Therapy (Signed)
[place], [place], [address] Phone: [phone]   Fax:  [phone]  Physical Therapy Treatment  Patient Details  Name: Samantha Holden MRN: [id number] Date of Birth: 23-Sep-1970 Referring Provider (PT): Mcarthur Rossetti, MD   Encounter Date: 02/01/2021   PT End of Session - 02/01/21 1323    Visit Number 3    Number of Visits 17    Date for PT Re-Evaluation 03/18/21    Authorization Type UHC    PT Start Time 1320    PT Stop Time 1400    PT Time Calculation (min) 40 min           Past Medical History:  Diagnosis Date  . Allergy   . Chicken pox     Past Surgical History:  Procedure Laterality Date  . DILATION AND CURETTAGE, DIAGNOSTIC / THERAPEUTIC  1992  . WISDOM TOOTH EXTRACTION      There were no vitals filed for this visit.   Subjective Assessment - 02/01/21 1322    Subjective Left knee5-6/10 pain today and right knee is a 1/10.    Currently in Pain? Yes    Pain Score 5     Pain Location Knee    Pain Orientation Left    Pain Descriptors / Indicators Aching    Pain Type Chronic pain    Aggravating Factors  standing , walking    Pain Relieving Factors ice heat                             [facility] Adult PT Treatment/Exercise - 02/01/21 0001      Ambulation/Gait   Ambulation/Gait Assistance 5: Supervision    Ambulation/Gait Assistance Details 42    Gait Comments pt ambulates with bilateral decrease knee ext and significant trunk flexion along with heavy reliance on FWW      Knee/Hip Exercises: Stretches   Active Hamstring Stretch 2 reps;20 seconds    Active Hamstring Stretch Limitations yoga strap    Passive Hamstring Stretch 5 reps;20 seconds;Left    Other Knee/Hip Stretches seated hamstring stretch x 30 sec ea   x 2 each     Knee/Hip Exercises: Aerobic   Nustep lvl 5 x 6 min while taking subjective      Knee/Hip Exercises: Standing   Other Standing  Knee Exercises gluteal sets x 10    Other Standing Knee Exercises weight shifting right to left LE holding FWW      Knee/Hip Exercises: Seated   Long Arc Quad 2 sets;10 reps    Long Arc Quad Limitations red band given for HEP      Knee/Hip Exercises: Supine   Quad Sets Limitations seated with foot in chair x 15 each    Bridges 2 sets;10 reps    Straight Leg Raises 5 reps;2 sets;Left;10 reps    Straight Leg Raises Limitations cues to maintain extension                    PT Short Term Goals - 01/21/21 0910      PT SHORT TERM GOAL #1   Title Pt will be independent with initial HEP    Baseline initial HEP given    Time 3    Period Weeks    Status New    Target Date 02/11/21             PT Long Term Goals - 01/26/21  1920      PT LONG TERM GOAL #1   Title Pt will decrease 5xSTS to no greater than 25 seconds in order to decrease fall risk and improve mobility    Baseline 49 seconds    Time 8    Period Weeks    Status New    Target Date 03/18/21      PT LONG TERM GOAL #2   Title Pt will improve bilateral knee AROM to in range of 5-115 degrees in order to improve mobility    Baseline see flowsheet    Time 8    Period Weeks    Status New    Target Date 03/18/21      PT LONG TERM GOAL #3   Title Pt will improve FOTO function score to no less than 55% in order to improve confidence and functional ability    Baseline 26% function    Time 8    Period Weeks    Status New    Target Date 03/18/21      PT LONG TERM GOAL #4   Title Pt will improve all LE MMT to no less than 4+/5 in order to improve mobility    Baseline see flowsheet    Time 8    Period Weeks    Status New    Target Date 03/18/21      PT LONG TERM GOAL #5   Title Pt will decrease TUG with LRAD to no greater than 25 seconds in order to improve functional mobility and decrease fall risk    Baseline 70 seconds    Time 8    Period Weeks    Status New    Target Date 03/18/21                  Plan - 02/01/21 1411    Clinical Impression Statement Pt arrives in [facility] and can self propel using her LEs. Gait in clinic with trunk flexion and knee flexion using FWW. Worked on left knee extension stretching and strengthening. Able to tolerate SLR on the left today as well as red band resistance for LAQ. She was given red band for HEP. Continued with encouragement for standing weight shifts and setting an alarm to get up and walk every 30 min to 1 hour. Pt was agreeable.    PT Next Visit Plan continue to assess gait and progress LE strengthening and knee AROM as able    PT Home Exercise Plan Access Code [place]           Patient will benefit from skilled therapeutic intervention in order to improve the following deficits and impairments:  Abnormal gait,Decreased activity tolerance,Decreased balance,Decreased endurance,Decreased mobility,Decreased range of motion,Decreased strength,Difficulty walking,Pain  Visit Diagnosis: Chronic pain of right knee  Chronic pain of left knee  Muscle weakness (generalized)  Unsteadiness on feet     Problem List Patient Active Problem List   Diagnosis Date Noted  . Symptomatic mammary hypertrophy 04/28/2020  . Shoulder pain 04/28/2020  . Back pain 04/28/2020  . Impingement syndrome of left ankle 12/30/2019  . Prediabetes 12/23/2019  . Right ankle pain 02/16/2018    Dorene Ar, PTA 02/01/2021, 2:15 PM  [facility] [address] [place], [place], [address] Phone: [phone]   Fax:  [phone]  Name: Samantha Holden MRN: [id number] Date of Birth: 08-May-1971

## 2021-02-03 ENCOUNTER — Other Ambulatory Visit: Payer: Self-pay

## 2021-02-03 ENCOUNTER — Ambulatory Visit: Payer: No Typology Code available for payment source

## 2021-02-03 DIAGNOSIS — M25561 Pain in right knee: Secondary | ICD-10-CM | POA: Diagnosis not present

## 2021-02-03 DIAGNOSIS — M6281 Muscle weakness (generalized): Secondary | ICD-10-CM

## 2021-02-03 DIAGNOSIS — R2681 Unsteadiness on feet: Secondary | ICD-10-CM

## 2021-02-03 DIAGNOSIS — G8929 Other chronic pain: Secondary | ICD-10-CM

## 2021-02-03 NOTE — Therapy (Signed)
[place], [place], [address] Phone: [phone]   Fax:  [phone]  Physical Therapy Treatment  Patient Details  Name: Samantha Holden MRN: [id number] Date of Birth: 10/25/1970 Referring Provider (PT): Mcarthur Rossetti, MD   Encounter Date: 02/03/2021   PT End of Session - 02/03/21 1800    Visit Number 4    Number of Visits 17    Date for PT Re-Evaluation 03/18/21    Authorization Type UHC    PT Start Time 4709   arrived late   PT Stop Time 1834    PT Time Calculation (min) 39 min    Activity Tolerance Patient tolerated treatment well;No increased pain    Behavior During Therapy WFL for tasks assessed/performed           Past Medical History:  Diagnosis Date  . Allergy   . Chicken pox     Past Surgical History:  Procedure Laterality Date  . DILATION AND CURETTAGE, DIAGNOSTIC / THERAPEUTIC  1992  . WISDOM TOOTH EXTRACTION      There were no vitals filed for this visit.   Subjective Assessment - 02/03/21 1759    Subjective Pt presents to PT with continued reports of bilateral knee pain. She has been compliant with her HEP with no adverse effect. Pt is ready to begin PT treatment at this time.    Currently in Pain? Yes    Pain Score 4     Pain Location Knee    Pain Orientation Right;Left                             [facility] Adult PT Treatment/Exercise - 02/03/21 0001      Knee/Hip Exercises: Stretches   Active Hamstring Stretch 2 reps;30 seconds;Both    Active Hamstring Stretch Limitations seated    Passive Hamstring Stretch 2 reps;60 seconds    Other Knee/Hip Stretches prolonged knee extension stretch with chair x 60 sec ea      Knee/Hip Exercises: Seated   Long Arc Quad 2 sets;10 reps;Both    Long Arc Quad Weight 2 lbs.      Knee/Hip Exercises: Supine   Quad Sets 15 reps;Both    Quad Sets Limitations with towel under heel 5 sec hold    Straight Leg Raises  10 reps;Both      Knee/Hip Exercises: Sidelying   Clams 2x10 ea green tband                  PT Education - 02/03/21 1840    Education Details HEP    Person(s) Educated Patient    Methods Explanation;Demonstration;Handout    Comprehension Verbalized understanding;Returned demonstration            PT Short Term Goals - 01/21/21 0910      PT SHORT TERM GOAL #1   Title Pt will be independent with initial HEP    Baseline initial HEP given    Time 3    Period Weeks    Status New    Target Date 02/11/21             PT Long Term Goals - 01/26/21 1920      PT LONG TERM GOAL #1   Title Pt will decrease 5xSTS to no greater than 25 seconds in order to decrease fall risk and improve mobility    Baseline 49 seconds    Time 8  Period Weeks    Status New    Target Date 03/18/21      PT LONG TERM GOAL #2   Title Pt will improve bilateral knee AROM to in range of 5-115 degrees in order to improve mobility    Baseline see flowsheet    Time 8    Period Weeks    Status New    Target Date 03/18/21      PT LONG TERM GOAL #3   Title Pt will improve FOTO function score to no less than 55% in order to improve confidence and functional ability    Baseline 26% function    Time 8    Period Weeks    Status New    Target Date 03/18/21      PT LONG TERM GOAL #4   Title Pt will improve all LE MMT to no less than 4+/5 in order to improve mobility    Baseline see flowsheet    Time 8    Period Weeks    Status New    Target Date 03/18/21      PT LONG TERM GOAL #5   Title Pt will decrease TUG with LRAD to no greater than 25 seconds in order to improve functional mobility and decrease fall risk    Baseline 70 seconds    Time 8    Period Weeks    Status New    Target Date 03/18/21                 Plan - 02/03/21 1837    Clinical Impression Statement Pt again arrives in manual w/c and has difficulty maintaining standing position d/t weakness and knee pain. Pt  continues to benefit from form skilled PT working on improving LE strength, standing tolerance, and knee ROM. HEP updated to included prolonged duration stretch for bilateral knee extension. Will continue to progress standing exercises as tolerated.    PT Treatment/Interventions ADLs/Self Care Home Management;Electrical Stimulation;Cryotherapy;Moist Heat;Gait training;Stair training;Functional mobility training;Therapeutic activities;Therapeutic exercise;Neuromuscular re-education;Patient/family education;Manual techniques;Passive range of motion;Dry needling;Taping    PT Next Visit Plan continue to assess gait and progress LE strengthening and knee AROM as able    PT Home Exercise Plan Access Code [facility]           Patient will benefit from skilled therapeutic intervention in order to improve the following deficits and impairments:  Abnormal gait,Decreased activity tolerance,Decreased balance,Decreased endurance,Decreased mobility,Decreased range of motion,Decreased strength,Difficulty walking,Pain  Visit Diagnosis: Chronic pain of right knee  Chronic pain of left knee  Muscle weakness (generalized)  Unsteadiness on feet     Problem List Patient Active Problem List   Diagnosis Date Noted  . Symptomatic mammary hypertrophy 04/28/2020  . Shoulder pain 04/28/2020  . Back pain 04/28/2020  . Impingement syndrome of left ankle 12/30/2019  . Prediabetes 12/23/2019  . Right ankle pain 02/16/2018    Ward Chatters, PT, DPT 02/03/21 6:42 PM  [facility] [address] [place], [place], [address] Phone: [phone]   Fax:  [phone]  Name: Samantha Holden MRN: [id number] Date of Birth: [birth year]

## 2021-02-10 ENCOUNTER — Ambulatory Visit: Payer: No Typology Code available for payment source | Attending: Orthopaedic Surgery

## 2021-02-10 ENCOUNTER — Other Ambulatory Visit: Payer: Self-pay

## 2021-02-10 DIAGNOSIS — R2681 Unsteadiness on feet: Secondary | ICD-10-CM | POA: Diagnosis present

## 2021-02-10 DIAGNOSIS — M6281 Muscle weakness (generalized): Secondary | ICD-10-CM

## 2021-02-10 DIAGNOSIS — M25562 Pain in left knee: Secondary | ICD-10-CM | POA: Diagnosis present

## 2021-02-10 DIAGNOSIS — M25561 Pain in right knee: Secondary | ICD-10-CM | POA: Diagnosis not present

## 2021-02-10 DIAGNOSIS — G8929 Other chronic pain: Secondary | ICD-10-CM

## 2021-02-10 NOTE — Therapy (Signed)
[place], [place], [address] Phone: [phone]   Fax:  [phone]  Physical Therapy Treatment  Patient Details  Name: Samantha Holden MRN: [id number] Date of Birth: 01/24/1971 Referring Provider (PT): Mcarthur Rossetti, MD   Encounter Date: 02/10/2021   PT End of Session - 02/10/21 1634    Visit Number 5    Number of Visits 17    Date for PT Re-Evaluation 03/18/21    Authorization Type UHC    PT Start Time 3875   arrived late   PT Stop Time 1700    PT Time Calculation (min) 31 min    Activity Tolerance Patient tolerated treatment well;No increased pain    Behavior During Therapy WFL for tasks assessed/performed           Past Medical History:  Diagnosis Date  . Allergy   . Chicken pox     Past Surgical History:  Procedure Laterality Date  . DILATION AND CURETTAGE, DIAGNOSTIC / THERAPEUTIC  1992  . WISDOM TOOTH EXTRACTION      There were no vitals filed for this visit.   Subjective Assessment - 02/10/21 1634    Subjective Pt presents to PT with reports of continued bilateral knee pain. She has been compliant with her HEP with no adverse effect. Pt is ready to begin PT treatment at this time.    Currently in Pain? Yes    Pain Score 5     Pain Location Knee    Pain Orientation Right;Left   lateral R knee, medial L knee                            [facility] Adult PT Treatment/Exercise - 02/10/21 0001      Ambulation/Gait   Ambulation/Gait Assistance 5: Supervision    Ambulation/Gait Assistance Details ambulated with FWW; PT cued pt to increase trunk and knee extension    Ambulation Distance (Feet) 112 Feet    Assistive device Rolling walker    Gait Pattern Trunk flexed;Antalgic;Right flexed knee in stance;Left flexed knee in stance      Knee/Hip Exercises: Stretches   Active Hamstring Stretch 30 seconds;Both    Active Hamstring Stretch Limitations seated       Knee/Hip Exercises: Seated   Long Arc Quad 2 sets;10 reps;Both    Long Arc Quad Weight 2 lbs.      Knee/Hip Exercises: Supine   Quad Sets 15 reps;Both    Quad Sets Limitations towel under heels - 5 sec hold    Short Arc Quad Sets 2 sets;10 reps;Both    Straight Leg Raises 10 reps;Both                    PT Short Term Goals - 01/21/21 0910      PT SHORT TERM GOAL #1   Title Pt will be independent with initial HEP    Baseline initial HEP given    Time 3    Period Weeks    Status New    Target Date 02/11/21             PT Long Term Goals - 01/26/21 1920      PT LONG TERM GOAL #1   Title Pt will decrease 5xSTS to no greater than 25 seconds in order to decrease fall risk and improve mobility    Baseline 49 seconds    Time 8  Period Weeks    Status New    Target Date 03/18/21      PT LONG TERM GOAL #2   Title Pt will improve bilateral knee AROM to in range of 5-115 degrees in order to improve mobility    Baseline see flowsheet    Time 8    Period Weeks    Status New    Target Date 03/18/21      PT LONG TERM GOAL #3   Title Pt will improve FOTO function score to no less than 55% in order to improve confidence and functional ability    Baseline 26% function    Time 8    Period Weeks    Status New    Target Date 03/18/21      PT LONG TERM GOAL #4   Title Pt will improve all LE MMT to no less than 4+/5 in order to improve mobility    Baseline see flowsheet    Time 8    Period Weeks    Status New    Target Date 03/18/21      PT LONG TERM GOAL #5   Title Pt will decrease TUG with LRAD to no greater than 25 seconds in order to improve functional mobility and decrease fall risk    Baseline 70 seconds    Time 8    Period Weeks    Status New    Target Date 03/18/21                 Plan - 02/10/21 1710    Clinical Impression Statement Pt arrived late in manual w/c and continued bilateral knee pain/stiffness. She was able to ambulate further  today with continued decrease in trunk and knee extension. She continues to benefit and require skilled PT services working on improving LE strength and functional mobility. Will continue to progress as able.    PT Treatment/Interventions ADLs/Self Care Home Management;Electrical Stimulation;Cryotherapy;Moist Heat;Gait training;Stair training;Functional mobility training;Therapeutic activities;Therapeutic exercise;Neuromuscular re-education;Patient/family education;Manual techniques;Passive range of motion;Dry needling;Taping    PT Next Visit Plan continue to assess gait and progress LE strengthening and knee AROM as able    PT Home Exercise Plan Access Code [facility]           Patient will benefit from skilled therapeutic intervention in order to improve the following deficits and impairments:  Abnormal gait,Decreased activity tolerance,Decreased balance,Decreased endurance,Decreased mobility,Decreased range of motion,Decreased strength,Difficulty walking,Pain  Visit Diagnosis: Chronic pain of right knee  Chronic pain of left knee  Muscle weakness (generalized)  Unsteadiness on feet     Problem List Patient Active Problem List   Diagnosis Date Noted  . Symptomatic mammary hypertrophy 04/28/2020  . Shoulder pain 04/28/2020  . Back pain 04/28/2020  . Impingement syndrome of left ankle 12/30/2019  . Prediabetes 12/23/2019  . Right ankle pain 02/16/2018    Ward Chatters, PT, DPT 02/10/21 5:14 PM  [facility] [address] [place], [place], [address] Phone: [phone]   Fax:  [phone]  Name: Samantha Holden MRN: [id number] Date of Birth: 1971/02/09

## 2021-02-11 ENCOUNTER — Ambulatory Visit: Payer: No Typology Code available for payment source

## 2021-02-11 DIAGNOSIS — G8929 Other chronic pain: Secondary | ICD-10-CM

## 2021-02-11 DIAGNOSIS — M25561 Pain in right knee: Secondary | ICD-10-CM | POA: Diagnosis not present

## 2021-02-11 DIAGNOSIS — M25562 Pain in left knee: Secondary | ICD-10-CM

## 2021-02-11 DIAGNOSIS — R2681 Unsteadiness on feet: Secondary | ICD-10-CM

## 2021-02-11 DIAGNOSIS — M6281 Muscle weakness (generalized): Secondary | ICD-10-CM

## 2021-02-11 NOTE — Therapy (Signed)
[place], [place], [address] Phone: [phone]   Fax:  [phone]  Physical Therapy Treatment  Patient Details  Name: IYSIS GERMAIN MRN: [id number] Date of Birth: 02-May-1971 Referring Provider (PT): Mcarthur Rossetti, MD   Encounter Date: 02/11/2021   PT End of Session - 02/11/21 [place]    Visit Number 6    Number of Visits 17    Date for PT Re-Evaluation 03/18/21    Authorization Type UHC    PT Start Time 3810    PT Stop Time 1913    PT Time Calculation (min) 43 min    Activity Tolerance Patient tolerated treatment well;No increased pain    Behavior During Therapy WFL for tasks assessed/performed           Past Medical History:  Diagnosis Date  . Allergy   . Chicken pox     Past Surgical History:  Procedure Laterality Date  . DILATION AND CURETTAGE, DIAGNOSTIC / THERAPEUTIC  1992  . WISDOM TOOTH EXTRACTION      There were no vitals filed for this visit.   Subjective Assessment - 02/11/21 1830    Subjective Pt presents to PT with no current reports of knee pain. She has been compliant with HEP with no adverse effect. She is ready to begin PT treatment at this time.    Currently in Pain? No/denies    Pain Score 0-No pain              [facility] PT Assessment - 02/11/21 0001      Observation/Other Assessments   Focus on Therapeutic Outcomes (FOTO)  46% function                    [facility] Adult PT Treatment/Exercise - 02/11/21 0001      Knee/Hip Exercises: Stretches   Active Hamstring Stretch 60 seconds;Both    Active Hamstring Stretch Limitations seated    Passive Hamstring Stretch 60 seconds    Passive Hamstring Stretch Limitations with strap      Knee/Hip Exercises: Standing   Terminal Knee Extension 10 reps;Both;Theraband    Theraband Level (Terminal Knee Extension) Level 1 (Yellow)    Other Standing Knee Exercises amb in // x 1 lap    Other Standing Knee  Exercises weight shifts focusing on bilat knee ext x 10      Knee/Hip Exercises: Seated   Long Arc Quad 2 sets;10 reps;Both    Long Arc Quad Weight 3 lbs.      Knee/Hip Exercises: Supine   Quad Sets 15 reps;Both    Quad Sets Limitations towel under knees - 5 sec hold    Straight Leg Raises 2 sets;10 reps;Both                    PT Short Term Goals - 01/21/21 0910      PT SHORT TERM GOAL #1   Title Pt will be independent with initial HEP    Baseline initial HEP given    Time 3    Period Weeks    Status New    Target Date 02/11/21             PT Long Term Goals - 01/26/21 1920      PT LONG TERM GOAL #1   Title Pt will decrease 5xSTS to no greater than 25 seconds in order to decrease fall risk and improve mobility    Baseline 49 seconds  Time 8    Period Weeks    Status New    Target Date 03/18/21      PT LONG TERM GOAL #2   Title Pt will improve bilateral knee AROM to in range of 5-115 degrees in order to improve mobility    Baseline see flowsheet    Time 8    Period Weeks    Status New    Target Date 03/18/21      PT LONG TERM GOAL #3   Title Pt will improve FOTO function score to no less than 55% in order to improve confidence and functional ability    Baseline 26% function    Time 8    Period Weeks    Status New    Target Date 03/18/21      PT LONG TERM GOAL #4   Title Pt will improve all LE MMT to no less than 4+/5 in order to improve mobility    Baseline see flowsheet    Time 8    Period Weeks    Status New    Target Date 03/18/21      PT LONG TERM GOAL #5   Title Pt will decrease TUG with LRAD to no greater than 25 seconds in order to improve functional mobility and decrease fall risk    Baseline 70 seconds    Time 8    Period Weeks    Status New    Target Date 03/18/21                 Plan - 02/11/21 1918    Clinical Impression Statement Pt was able to complete prescribed exercises and shows improved standing tolerance.  Pt shows improving bilateral knee extension, but does continue to have distal hamstring tightness and quad weakness. Her FOTO score has improved by 20% since initial evaluation. She continues to benefit from skilled PT services and will continue to be progressed as tolerated.    PT Treatment/Interventions ADLs/Self Care Home Management;Electrical Stimulation;Cryotherapy;Moist Heat;Gait training;Stair training;Functional mobility training;Therapeutic activities;Therapeutic exercise;Neuromuscular re-education;Patient/family education;Manual techniques;Passive range of motion;Dry needling;Taping    PT Next Visit Plan continue to assess gait and progress LE strengthening and knee AROM as able    PT Home Exercise Plan Access Code [facility]           Patient will benefit from skilled therapeutic intervention in order to improve the following deficits and impairments:  Abnormal gait,Decreased activity tolerance,Decreased balance,Decreased endurance,Decreased mobility,Decreased range of motion,Decreased strength,Difficulty walking,Pain  Visit Diagnosis: Chronic pain of right knee  Chronic pain of left knee  Muscle weakness (generalized)  Unsteadiness on feet     Problem List Patient Active Problem List   Diagnosis Date Noted  . Symptomatic mammary hypertrophy 04/28/2020  . Shoulder pain 04/28/2020  . Back pain 04/28/2020  . Impingement syndrome of left ankle 12/30/2019  . Prediabetes 12/23/2019  . Right ankle pain 02/16/2018    Ward Chatters, PT, DPT 02/11/21 7:22 PM  [facility] [address] [place], [place], [address] Phone: [phone]   Fax:  [phone]  Name: LISEL SIEGRIST MRN: [id number] Date of Birth: 07-23-1971

## 2021-02-16 ENCOUNTER — Ambulatory Visit: Payer: No Typology Code available for payment source

## 2021-02-16 ENCOUNTER — Other Ambulatory Visit: Payer: Self-pay

## 2021-02-16 DIAGNOSIS — M25561 Pain in right knee: Secondary | ICD-10-CM

## 2021-02-16 DIAGNOSIS — M25562 Pain in left knee: Secondary | ICD-10-CM

## 2021-02-16 DIAGNOSIS — M6281 Muscle weakness (generalized): Secondary | ICD-10-CM

## 2021-02-16 DIAGNOSIS — R2681 Unsteadiness on feet: Secondary | ICD-10-CM

## 2021-02-16 DIAGNOSIS — G8929 Other chronic pain: Secondary | ICD-10-CM

## 2021-02-16 NOTE — Therapy (Signed)
[place], [place], [address] Phone: [phone]   Fax:  [phone]  Physical Therapy Treatment  Patient Details  Name: Samantha Holden MRN: [id number] Date of Birth: 1971-09-09 Referring Provider (PT): Mcarthur Rossetti, MD   Encounter Date: 02/16/2021   PT End of Session - 02/16/21 1749    Visit Number 7    Number of Visits 17    Date for PT Re-Evaluation 03/18/21    Authorization Type UHC    PT Start Time 8676    PT Stop Time 1828    PT Time Calculation (min) 41 min    Activity Tolerance Patient tolerated treatment well;No increased pain    Behavior During Therapy WFL for tasks assessed/performed           Past Medical History:  Diagnosis Date  . Allergy   . Chicken pox     Past Surgical History:  Procedure Laterality Date  . DILATION AND CURETTAGE, DIAGNOSTIC / THERAPEUTIC  1992  . WISDOM TOOTH EXTRACTION      There were no vitals filed for this visit.   Subjective Assessment - 02/16/21 1750    Subjective Pt presents to PT with reports of increased L knee pain. She had a lot of travel over the weekend and responded fairly well. Pt is ready to begin PT treatment at this time.    Currently in Pain? Yes    Pain Score 4     Pain Location Knee    Pain Orientation Left                             [facility] Adult PT Treatment/Exercise - 02/16/21 0001      Knee/Hip Exercises: Aerobic   Nustep lvl 5 UE/LE x 5 min while taking subjective      Knee/Hip Exercises: Standing   Terminal Knee Extension 10 reps;Both;Theraband    Theraband Level (Terminal Knee Extension) Level 1 (Yellow)    Other Standing Knee Exercises standing glute set x 5 - 5 se chold    Other Standing Knee Exercises standing hamstring stretch on 4in step x 30 sec ea      Knee/Hip Exercises: Seated   Long Arc Quad 15 reps;Both    Long Arc Quad Weight 3 lbs.    Long Arc Quad Limitations 5 sec hold     Sit to [employer] 5 reps;with UE support      Knee/Hip Exercises: Supine   Quad Sets 10 reps    Quad Sets Limitations wedge under heels - 5 sec hold    Straight Leg Raises 2 sets;10 reps;Both                  PT Education - 02/16/21 1819    Education Details HEP    Person(s) Educated Patient    Methods Explanation;Demonstration;Handout    Comprehension Verbalized understanding;Returned demonstration            PT Short Term Goals - 01/21/21 0910      PT SHORT TERM GOAL #1   Title Pt will be independent with initial HEP    Baseline initial HEP given    Time 3    Period Weeks    Status New    Target Date 02/11/21             PT Long Term Goals - 01/26/21 1920      PT LONG TERM GOAL #1  Title Pt will decrease 5xSTS to no greater than 25 seconds in order to decrease fall risk and improve mobility    Baseline 49 seconds    Time 8    Period Weeks    Status New    Target Date 03/18/21      PT LONG TERM GOAL #2   Title Pt will improve bilateral knee AROM to in range of 5-115 degrees in order to improve mobility    Baseline see flowsheet    Time 8    Period Weeks    Status New    Target Date 03/18/21      PT LONG TERM GOAL #3   Title Pt will improve FOTO function score to no less than 55% in order to improve confidence and functional ability    Baseline 26% function    Time 8    Period Weeks    Status New    Target Date 03/18/21      PT LONG TERM GOAL #4   Title Pt will improve all LE MMT to no less than 4+/5 in order to improve mobility    Baseline see flowsheet    Time 8    Period Weeks    Status New    Target Date 03/18/21      PT LONG TERM GOAL #5   Title Pt will decrease TUG with LRAD to no greater than 25 seconds in order to improve functional mobility and decrease fall risk    Baseline 70 seconds    Time 8    Period Weeks    Status New    Target Date 03/18/21                 Plan - 02/16/21 1817    Clinical Impression Statement Pt  was able to complete prescribed exercises with no adverse effect. She does continue to have difficulty with standing exercises, but is slowly improving her tolerance.    PT Treatment/Interventions ADLs/Self Care Home Management;Electrical Stimulation;Cryotherapy;Moist Heat;Gait training;Stair training;Functional mobility training;Therapeutic activities;Therapeutic exercise;Neuromuscular re-education;Patient/family education;Manual techniques;Passive range of motion;Dry needling;Taping    PT Next Visit Plan continue to assess gait and progress LE strengthening and knee AROM as able    PT Home Exercise Plan Access Code [facility]           Patient will benefit from skilled therapeutic intervention in order to improve the following deficits and impairments:  Abnormal gait,Decreased activity tolerance,Decreased balance,Decreased endurance,Decreased mobility,Decreased range of motion,Decreased strength,Difficulty walking,Pain  Visit Diagnosis: Chronic pain of right knee  Chronic pain of left knee  Muscle weakness (generalized)  Unsteadiness on feet     Problem List Patient Active Problem List   Diagnosis Date Noted  . Symptomatic mammary hypertrophy 04/28/2020  . Shoulder pain 04/28/2020  . Back pain 04/28/2020  . Impingement syndrome of left ankle 12/30/2019  . Prediabetes 12/23/2019  . Right ankle pain 02/16/2018    Ward Chatters, PT, DPT 02/16/21 7:15 PM  [facility] [address] [place], [place], [address] Phone: [phone]   Fax:  [phone]  Name: LANEICE MENEELY MRN: [id number] Date of Birth: 1970/12/03

## 2021-02-24 ENCOUNTER — Other Ambulatory Visit: Payer: Self-pay

## 2021-02-24 ENCOUNTER — Ambulatory Visit: Payer: No Typology Code available for payment source

## 2021-02-24 DIAGNOSIS — M25562 Pain in left knee: Secondary | ICD-10-CM

## 2021-02-24 DIAGNOSIS — M6281 Muscle weakness (generalized): Secondary | ICD-10-CM

## 2021-02-24 DIAGNOSIS — G8929 Other chronic pain: Secondary | ICD-10-CM

## 2021-02-24 DIAGNOSIS — M25561 Pain in right knee: Secondary | ICD-10-CM

## 2021-02-24 DIAGNOSIS — R2681 Unsteadiness on feet: Secondary | ICD-10-CM

## 2021-02-25 ENCOUNTER — Ambulatory Visit: Payer: No Typology Code available for payment source

## 2021-02-25 DIAGNOSIS — R2681 Unsteadiness on feet: Secondary | ICD-10-CM

## 2021-02-25 DIAGNOSIS — M25561 Pain in right knee: Secondary | ICD-10-CM | POA: Diagnosis not present

## 2021-02-25 DIAGNOSIS — M6281 Muscle weakness (generalized): Secondary | ICD-10-CM

## 2021-02-25 DIAGNOSIS — G8929 Other chronic pain: Secondary | ICD-10-CM

## 2021-02-25 NOTE — Therapy (Signed)
[place], [place], [address] Phone: [phone]   Fax:  [phone]  Physical Therapy Treatment  Patient Details  Name: Samantha Holden MRN: [id number] Date of Birth: May 24, 1971 Referring Provider (PT): Mcarthur Rossetti, MD   Encounter Date: 02/24/2021   PT End of Session - 02/24/21 1835     Visit Number 88    Number of Visits 17    Date for PT Re-Evaluation 03/18/21    Authorization Type [employer]    PT Start Time 5170    PT Stop Time 1912    PT Time Calculation (min) 42 min    Activity Tolerance Patient tolerated treatment well;No increased pain    Behavior During Therapy WFL for tasks assessed/performed             Past Medical History:  Diagnosis Date   Allergy    Chicken pox     Past Surgical History:  Procedure Laterality Date   DILATION AND CURETTAGE, DIAGNOSTIC / THERAPEUTIC  1992   WISDOM TOOTH EXTRACTION      There were no vitals filed for this visit.   Subjective Assessment - 02/24/21 1835     Subjective Pt presents to PT with no current knee pain, but does note L lateral proximal tiba discomfort. She states this pain started roughly 3 days ago. She has gone back into the office this week but has remained w/c bound. Pt has been compliant with her HEP with no adverse effect. She is ready to begin PT at this time.    Currently in Pain? Yes    Pain Score 3     Pain Location Tibia    Pain Orientation Left                               [facility] Adult PT Treatment/Exercise - 02/25/21 0001       Ambulation/Gait   Ambulation/Gait Yes    Ambulation Distance (Feet) 150 Feet    Assistive device Rolling walker    Gait Pattern Trunk flexed;Antalgic;Right flexed knee in stance;Left flexed knee in stance    Ambulation Surface Level    Gait Comments w/c follow; pt continues to amb with decreased trunk ext, decreased bilat knee ext      Knee/Hip Exercises:  Stretches   Active Hamstring Stretch 60 seconds;Both    Active Hamstring Stretch Limitations seated    Passive Hamstring Stretch 60 seconds;Both      Knee/Hip Exercises: Aerobic   Nustep lvl 5 UE/LE x 5 min while taking subjective      Knee/Hip Exercises: Standing   Terminal Knee Extension 10 reps;Both;Theraband    Theraband Level (Terminal Knee Extension) Level 2 (Red)    Other Standing Knee Exercises standing glute set x 10 - 5 sec hold      Knee/Hip Exercises: Seated   Long Arc Quad 15 reps;Both    Long Arc Quad Weight 3 lbs.    Long Arc Quad Limitations 5 sec hold      Knee/Hip Exercises: Supine   Quad Sets 2 sets;10 reps   5 sec hold   Quad Sets Limitations towel under knees    Bridges 2 sets;10 reps                      PT Short Term Goals - 01/21/21 0910       PT SHORT TERM GOAL #1  Title Pt will be independent with initial HEP    Baseline initial HEP given    Time 3    Period Weeks    Status New    Target Date 02/11/21               PT Long Term Goals - 01/26/21 1920       PT LONG TERM GOAL #1   Title Pt will decrease 5xSTS to no greater than 25 seconds in order to decrease fall risk and improve mobility    Baseline 49 seconds    Time 8    Period Weeks    Status New    Target Date 03/18/21      PT LONG TERM GOAL #2   Title Pt will improve bilateral knee AROM to in range of 5-115 degrees in order to improve mobility    Baseline see flowsheet    Time 8    Period Weeks    Status New    Target Date 03/18/21      PT LONG TERM GOAL #3   Title Pt will improve FOTO function score to no less than 55% in order to improve confidence and functional ability    Baseline 26% function    Time 8    Period Weeks    Status New    Target Date 03/18/21      PT LONG TERM GOAL #4   Title Pt will improve all LE MMT to no less than 4+/5 in order to improve mobility    Baseline see flowsheet    Time 8    Period Weeks    Status New    Target Date  03/18/21      PT LONG TERM GOAL #5   Title Pt will decrease TUG with LRAD to no greater than 25 seconds in order to improve functional mobility and decrease fall risk    Baseline 70 seconds    Time 8    Period Weeks    Status New    Target Date 03/18/21                   Plan - 02/25/21 0906     Clinical Impression Statement Pt was once again able to complete prescribed exercises, but continues to have difficulty with standing activity. Increased gait distance today, with continued heavy reliance on FWW. Pt is slowly improving with therapy, with PT continuing to insist that she increase her standing activity at home and work. Will conitnue with POC as prescribed.    PT Treatment/Interventions ADLs/Self Care Home Management;Electrical Stimulation;Cryotherapy;Moist Heat;Gait training;Stair training;Functional mobility training;Therapeutic activities;Therapeutic exercise;Neuromuscular re-education;Patient/family education;Manual techniques;Passive range of motion;Dry needling;Taping    PT Next Visit Plan continue to assess gait and progress LE strengthening and knee AROM as able    PT Home Exercise Plan Access Code [facility]             Patient will benefit from skilled therapeutic intervention in order to improve the following deficits and impairments:  Abnormal gait, Decreased activity tolerance, Decreased balance, Decreased endurance, Decreased mobility, Decreased range of motion, Decreased strength, Difficulty walking, Pain  Visit Diagnosis: Chronic pain of right knee  Chronic pain of left knee  Muscle weakness (generalized)  Unsteadiness on feet     Problem List Patient Active Problem List   Diagnosis Date Noted   Symptomatic mammary hypertrophy 04/28/2020   Shoulder pain 04/28/2020   Back pain 04/28/2020   Impingement syndrome of left ankle 12/30/2019  Prediabetes 12/23/2019   Right ankle pain 02/16/2018    Ward Chatters, PT, DPT 02/25/21 9:12  AM  [facility] [address] [place], [place], [address] Phone: [phone]   Fax:  [phone]  Name: MADAI NUCCIO MRN: [id number] Date of Birth: 12-08-1970

## 2021-02-25 NOTE — Progress Notes (Addendum)
[place] [facility] [place], [place], [place] [address] [phone]  Date:  03/01/2021   Name:  Samantha Holden   DOB:  1971/07/12   MRN:  [id number]  PCP:  Darreld Mclean, MD    Chief Complaint: Annual Exam   History of Present Illness:  Samantha Holden is a 50 y.o. very pleasant female patient who presents with the following:  Patient seen today for routine physical Most recent visit with myself 1 year ago She is generally in good health-history of prediabetes Never smoker  Pap can be updated- will update today  COVID-19 vaccine- encouraged her to do this  Mammogram due-  Colonoscopy up-to-date Can do HIV and hep C screening with routine labs Most recent lab work 1 year ago-at that time I asked her to come back in 2 to 3 months to recheck blood count/anemia evaluation.   MCV is on the lower side, more suggestive of an iron than a B12 or folate issue Start shingrix today   She is having trouble with her right knee, is being seen by orthopedics- she is in a WC today  She has had a few knee injections, at this point I do think surgery will be helpful for her  She is no longer menstruating-last menstrual period about 15 months ago  She has noted a "sore" between her breasts present for about 3 weeks It seems to be healing but she is not sure what caused it  She has used vaseline and it seems to be clearing up No other spots on her body   Patient Active Problem List   Diagnosis Date Noted   Symptomatic mammary hypertrophy 04/28/2020   Shoulder pain 04/28/2020   Back pain 04/28/2020   Impingement syndrome of left ankle 12/30/2019   Prediabetes 12/23/2019   Right ankle pain 02/16/2018    Past Medical History:  Diagnosis Date   Allergy    Chicken pox     Past Surgical History:  Procedure Laterality Date   DILATION AND CURETTAGE, DIAGNOSTIC / THERAPEUTIC  1992   WISDOM TOOTH EXTRACTION      Social  History   Tobacco Use   Smoking status: Never   Smokeless tobacco: Never  Vaping Use   Vaping Use: Never used  Substance Use Topics   Alcohol use: No   Drug use: Never    Family History  Problem Relation Age of Onset   Breast cancer Mother    Arthritis Mother    Hypertension Mother    Colon polyps Mother    Cancer Father    Bladder Cancer Father    Heart attack Father    Arthritis Maternal Grandmother    Heart disease Maternal Grandfather    Arthritis Maternal Grandfather    Heart attack Maternal Grandfather    Hypertension Maternal Grandfather    Colon cancer Neg Hx    Esophageal cancer Neg Hx    Stomach cancer Neg Hx    Rectal cancer Neg Hx     No Known Allergies  Medication list has been reviewed and updated.  Current Outpatient Medications on File Prior to Visit  Medication Sig Dispense Refill   meloxicam (MOBIC) 15 MG tablet TAKE 1 TABLET BY MOUTH EVERY DAY 30 tablet 1   No current facility-administered medications on file prior to visit.    Review of Systems:  As per HPI- otherwise negative.   Physical Examination: Vitals:  03/01/21 1406  BP: 118/68  Pulse: 90  Resp: 16  Temp: 97.8 F (36.6 C)  SpO2: 97%   Vitals:   03/01/21 1406  Weight: (!) 301 lb (136.5 kg)  Height: 5\' 11"  (1.803 m)   Body mass index is 41.98 kg/m. Ideal Body Weight: Weight in (lb) to have BMI = 25: 178.9  GEN: no acute distress.  Obese, sitting in wheelchair but is able to get onto exam table HEENT: Atraumatic, Normocephalic.  Bilateral TM wnl, oropharynx normal.  PEERL,EOMI.   Ears and Nose: No external deformity. CV: RRR, No M/G/R. No JVD. No thrill. No extra heart sounds. PULM: CTA B, no wheezes, crackles, rhonchi. No retractions. No resp. distress. No accessory muscle use. ABD: S, NT, ND, +BS. No rebound. No HSM. EXTR: No c/c/e PSYCH: Normally interactive. Conversant.  Pelvic exam performed today.  Visualization of cervix is limited due to body habitus.  No  adnexal masses or tenderness noted  Assessment and Plan: Physical exam  Screening for thyroid disorder - Plan: TSH  Pre-diabetes - Plan: Comprehensive metabolic panel, Hemoglobin A1c  Screening for hyperlipidemia - Plan: Lipid panel  Screening for deficiency anemia - Plan: CBC, Ferritin  Encounter for hepatitis C screening test for low risk patient - Plan: Hepatitis C antibody  Screening for HIV without presence of risk factors - Plan: HIV Antibody (routine testing w rflx)  Fatigue, unspecified type - Plan: VITAMIN D 25 Hydroxy (Vit-D Deficiency, Fractures)  Encounter for screening mammogram for malignant neoplasm of breast - Plan: MM 3D SCREEN BREAST BILATERAL  Immunization due - Plan: Varicella-zoster vaccine IM (Shingrix)  Screening for cervical cancer - Plan: Cytology - PAP  Patient seen today for physical exam.  Encouraged healthy diet and exercise routine  Labs are pending as above  Ordered screening mammogram  Shingles vaccine #1  Pap collected  Will plan further follow- up pending labs.  This visit occurred during the SARS-CoV-2 public health emergency.  Safety protocols were in place, including screening questions prior to the visit, additional usage of staff PPE, and extensive cleaning of exam room while observing appropriate contact time as indicated for disinfecting solutions.   Signed Lamar Blinks, MD  Received her labs as below, 6/21-message to patient  Results for orders placed or performed in visit on 03/01/21  CBC  Result Value Ref Range   WBC 4.3 4.0 - 10.5 K/uL   RBC 4.39 3.87 - 5.11 Mil/uL   Platelets 386.0 150.0 - 400.0 K/uL   Hemoglobin 9.9 (L) 12.0 - 15.0 g/dL   HCT 31.5 (L) 36.0 - 46.0 %   MCV 71.9 (L) 78.0 - 100.0 fl   MCHC 31.4 30.0 - 36.0 g/dL   RDW 19.2 (H) 11.5 - 15.5 %  Comprehensive metabolic panel  Result Value Ref Range   Sodium 138 135 - 145 mEq/L   Potassium 4.1 3.5 - 5.1 mEq/L   Chloride 102 96 - 112 mEq/L   CO2 25 19  - 32 mEq/L   Glucose, Bld 84 70 - 99 mg/dL   BUN 14 6 - 23 mg/dL   Creatinine, Ser 0.70 0.40 - 1.20 mg/dL   Total Bilirubin 0.5 0.2 - 1.2 mg/dL   Alkaline Phosphatase 84 39 - 117 U/L   AST 12 0 - 37 U/L   ALT 6 0 - 35 U/L   Total Protein 7.7 6.0 - 8.3 g/dL   Albumin 3.6 3.5 - 5.2 g/dL   GFR 100.25 >60.00 mL/min   Calcium 9.5 8.4 -  10.5 mg/dL  Hemoglobin A1c  Result Value Ref Range   Hgb A1c MFr Bld 6.2 4.6 - 6.5 %  HIV Antibody (routine testing w rflx)  Result Value Ref Range   HIV 1&2 Ab, 4th Generation NON-REACTIVE NON-REACTIVE  TSH  Result Value Ref Range   TSH 2.22 0.35 - 4.50 uIU/mL  VITAMIN D 25 Hydroxy (Vit-D Deficiency, Fractures)  Result Value Ref Range   VITD 7.60 (L) 30.00 - 100.00 ng/mL  Ferritin  Result Value Ref Range   Ferritin 94.2 10.0 - 291.0 ng/mL  Lipid panel  Result Value Ref Range   Cholesterol 161 0 - 200 mg/dL   Triglycerides 94.0 0.0 - 149.0 mg/dL   HDL 58.10 >39.00 mg/dL   VLDL 18.8 0.0 - 40.0 mg/dL   LDL Cholesterol 84 0 - 99 mg/dL   Total CHOL/HDL Ratio 3    NonHDL 102.40

## 2021-02-25 NOTE — Patient Instructions (Addendum)
It was good to see you again today!  You got your first shingles vaccine today- we can do your 2nd dose in 2-6 months I will be in touch with your labs asap- I did your one time recommended HIV and hep C screening tests today as well   Please schedule your mammogram with [facility] I would also recommend that you get a covid 19 booster asap  Continue to work on weight control   Perhaps try an OTC anti-fungal cream on the spot on your chest.  Let me know if it does not continue to clear up   Health Maintenance, Female Adopting a healthy lifestyle and getting preventive care are important in promoting health and wellness. Ask your health care provider about: The right schedule for you to have regular tests and exams. Things you can do on your own to prevent diseases and keep yourself healthy. What should I know about diet, weight, and exercise? Eat a healthy diet  Eat a diet that includes plenty of vegetables, fruits, low-fat dairy products, and lean protein. Do not eat a lot of foods that are high in solid fats, added sugars, or sodium.  Maintain a healthy weight Body mass index (BMI) is used to identify weight problems. It estimates body fat based on height and weight. Your health care provider can help determineyour BMI and help you achieve or maintain a healthy weight. Get regular exercise Get regular exercise. This is one of the most important things you can do for your health. Most adults should: Exercise for at least 150 minutes each week. The exercise should increase your heart rate and make you sweat (moderate-intensity exercise). Do strengthening exercises at least twice a week. This is in addition to the moderate-intensity exercise. Spend less time sitting. Even light physical activity can be beneficial. Watch cholesterol and blood lipids Have your blood tested for lipids and cholesterol at 51 years of age, then havethis test every 5 years. Have your cholesterol levels checked more  often if: Your lipid or cholesterol levels are high. You are older than 51 years of age. You are at high risk for heart disease. What should I know about cancer screening? Depending on your health history and family history, you may need to have cancer screening at various ages. This may include screening for: Breast cancer. Cervical cancer. Colorectal cancer. Skin cancer. Lung cancer. What should I know about heart disease, diabetes, and high blood pressure? Blood pressure and heart disease High blood pressure causes heart disease and increases the risk of stroke. This is more likely to develop in people who have high blood pressure readings, are of African descent, or are overweight. Have your blood pressure checked: Every 3-5 years if you are 71-88 years of age. Every year if you are 68 years old or older. Diabetes Have regular diabetes screenings. This checks your fasting blood sugar level. Have the screening done: Once every three years after age 42 if you are at a normal weight and have a low risk for diabetes. More often and at a younger age if you are overweight or have a high risk for diabetes. What should I know about preventing infection? Hepatitis B If you have a higher risk for hepatitis B, you should be screened for this virus. Talk with your health care provider to find out if you are at risk forhepatitis B infection. Hepatitis C Testing is recommended for: Everyone born from [birth year] through 1966. Anyone with known risk factors for hepatitis C. Sexually  transmitted infections (STIs) Get screened for STIs, including gonorrhea and chlamydia, if: You are sexually active and are younger than 51 years of age. You are older than 51 years of age and your health care provider tells you that you are at risk for this type of infection. Your sexual activity has changed since you were last screened, and you are at increased risk for chlamydia or gonorrhea. Ask your health care  provider if you are at risk. Ask your health care provider about whether you are at high risk for HIV. Your health care provider may recommend a prescription medicine to help prevent HIV infection. If you choose to take medicine to prevent HIV, you should first get tested for HIV. You should then be tested every 3 months for as long as you are taking the medicine. Pregnancy If you are about to stop having your period (premenopausal) and you may become pregnant, seek counseling before you get pregnant. Take 400 to 800 micrograms (mcg) of folic acid every day if you become pregnant. Ask for birth control (contraception) if you want to prevent pregnancy. Osteoporosis and menopause Osteoporosis is a disease in which the bones lose minerals and strength with aging. This can result in bone fractures. If you are 47 years old or older, or if you are at risk for osteoporosis and fractures, ask your health care provider if you should: Be screened for bone loss. Take a calcium or vitamin D supplement to lower your risk of fractures. Be given hormone replacement therapy (HRT) to treat symptoms of menopause. Follow these instructions at home: Lifestyle Do not use any products that contain nicotine or tobacco, such as cigarettes, e-cigarettes, and chewing tobacco. If you need help quitting, ask your health care provider. Do not use street drugs. Do not share needles. Ask your health care provider for help if you need support or information about quitting drugs. Alcohol use Do not drink alcohol if: Your health care provider tells you not to drink. You are pregnant, may be pregnant, or are planning to become pregnant. If you drink alcohol: Limit how much you use to 0-1 drink a day. Limit intake if you are breastfeeding. Be aware of how much alcohol is in your drink. In the [place] one drink equals one 12 oz bottle of beer (355 mL), one 5 oz glass of wine (148 mL), or one 1 oz glass of hard liquor (44  mL). General instructions Schedule regular health, dental, and eye exams. Stay current with your vaccines. Tell your health care provider if: You often feel depressed. You have ever been abused or do not feel safe at home. Summary Adopting a healthy lifestyle and getting preventive care are important in promoting health and wellness. Follow your health care provider's instructions about healthy diet, exercising, and getting tested or screened for diseases. Follow your health care provider's instructions on monitoring your cholesterol and blood pressure. This information is not intended to replace advice given to you by your health care provider. Make sure you discuss any questions you have with your healthcare provider. Document Revised: 08/22/2018 Document Reviewed: 08/22/2018 Elsevier Patient Education  2022 [employer].

## 2021-02-28 NOTE — Therapy (Signed)
[place], [place], [address] Phone: [phone]   Fax:  [phone]  Physical Therapy Treatment  Patient Details  Name: Samantha Holden MRN: [id number] Date of Birth: Jun 20, 1971 Referring Provider (PT): Mcarthur Rossetti, MD   Encounter Date: 02/25/2021    Past Medical History:  Diagnosis Date   Allergy    Chicken pox     Past Surgical History:  Procedure Laterality Date   DILATION AND CURETTAGE, DIAGNOSTIC / THERAPEUTIC  1992   WISDOM TOOTH EXTRACTION      There were no vitals filed for this visit.                      [facility] Adult PT Treatment/Exercise - 02/28/21 0001       Knee/Hip Exercises: Stretches   Active Hamstring Stretch 2 reps;30 seconds    Active Hamstring Stretch Limitations seated      Knee/Hip Exercises: Aerobic   Nustep lvl 5 UE/LE x 5 min while taking subjective      Knee/Hip Exercises: Standing   Abduction Limitations x 10 R stance; L stance unable      Knee/Hip Exercises: Seated   Long Arc Quad 2 sets;10 reps    Long Arc Quad Weight 4 lbs.    Long Arc Quad Limitations 5 sec hold    Sit to [employer] 2 sets;10 reps;without UE support      Knee/Hip Exercises: Supine   Quad Sets 2 sets;10 reps   5 sec hold   Other Supine Knee/Hip Exercises supine hamstring ball rollouts x15 green physioball      Knee/Hip Exercises: Sidelying   Clams 2x10 green tband                      PT Short Term Goals - 01/21/21 0910       PT SHORT TERM GOAL #1   Title Pt will be independent with initial HEP    Baseline initial HEP given    Time 3    Period Weeks    Status New    Target Date 02/11/21               PT Long Term Goals - 01/26/21 1920       PT LONG TERM GOAL #1   Title Pt will decrease 5xSTS to no greater than 25 seconds in order to decrease fall risk and improve mobility    Baseline 49 seconds    Time 8    Period Weeks    Status  New    Target Date 03/18/21      PT LONG TERM GOAL #2   Title Pt will improve bilateral knee AROM to in range of 5-115 degrees in order to improve mobility    Baseline see flowsheet    Time 8    Period Weeks    Status New    Target Date 03/18/21      PT LONG TERM GOAL #3   Title Pt will improve FOTO function score to no less than 55% in order to improve confidence and functional ability    Baseline 26% function    Time 8    Period Weeks    Status New    Target Date 03/18/21      PT LONG TERM GOAL #4   Title Pt will improve all LE MMT to no less than 4+/5 in order to improve mobility    Baseline see  flowsheet    Time 8    Period Weeks    Status New    Target Date 03/18/21      PT LONG TERM GOAL #5   Title Pt will decrease TUG with LRAD to no greater than 25 seconds in order to improve functional mobility and decrease fall risk    Baseline 70 seconds    Time 8    Period Weeks    Status New    Target Date 03/18/21                   Plan - 02/28/21 2114     Clinical Impression Statement Pt was able to complete prescribed exercises with no adverse effect. Continues to have difficulty with standing activity and gait d/t decreased LE strength and pain in bilateral knees. PT will continue to progress as able per POC as prescribe.d    PT Treatment/Interventions ADLs/Self Care Home Management;Electrical Stimulation;Cryotherapy;Moist Heat;Gait training;Stair training;Functional mobility training;Therapeutic activities;Therapeutic exercise;Neuromuscular re-education;Patient/family education;Manual techniques;Passive range of motion;Dry needling;Taping    PT Next Visit Plan continue to assess gait and progress LE strengthening and knee AROM as able    PT Home Exercise Plan Access Code [facility]             Patient will benefit from skilled therapeutic intervention in order to improve the following deficits and impairments:  Abnormal gait, Decreased activity tolerance,  Decreased balance, Decreased endurance, Decreased mobility, Decreased range of motion, Decreased strength, Difficulty walking, Pain  Visit Diagnosis: Chronic pain of right knee  Chronic pain of left knee  Muscle weakness (generalized)  Unsteadiness on feet     Problem List Patient Active Problem List   Diagnosis Date Noted   Symptomatic mammary hypertrophy 04/28/2020   Shoulder pain 04/28/2020   Back pain 04/28/2020   Impingement syndrome of left ankle 12/30/2019   Prediabetes 12/23/2019   Right ankle pain 02/16/2018    Ward Chatters, PT, DPT 02/28/21 9:17 PM  [facility] [address] [place], [place], [address] Phone: [phone]   Fax:  [phone]  Name: Samantha Holden MRN: [id number] Date of Birth: Jun 23, 1971

## 2021-03-01 ENCOUNTER — Ambulatory Visit (INDEPENDENT_AMBULATORY_CARE_PROVIDER_SITE_OTHER): Payer: No Typology Code available for payment source | Admitting: Family Medicine

## 2021-03-01 ENCOUNTER — Other Ambulatory Visit (HOSPITAL_COMMUNITY)
Admission: RE | Admit: 2021-03-01 | Discharge: 2021-03-01 | Disposition: A | Discharge status: Home / Self Care | Payer: No Typology Code available for payment source | Source: Ambulatory Visit | Attending: Family Medicine | Admitting: Family Medicine

## 2021-03-01 ENCOUNTER — Other Ambulatory Visit: Payer: Self-pay

## 2021-03-01 ENCOUNTER — Encounter: Payer: Self-pay | Admitting: Family Medicine

## 2021-03-01 VITALS — BP 118/68 | HR 90 | Temp 97.8°F | Resp 16 | Ht 71.0 in | Wt 301.0 lb

## 2021-03-01 DIAGNOSIS — Z23 Encounter for immunization: Secondary | ICD-10-CM | POA: Diagnosis not present

## 2021-03-01 DIAGNOSIS — D509 Iron deficiency anemia, unspecified: Secondary | ICD-10-CM

## 2021-03-01 DIAGNOSIS — Z1231 Encounter for screening mammogram for malignant neoplasm of breast: Secondary | ICD-10-CM

## 2021-03-01 DIAGNOSIS — Z0000 Encounter for general adult medical examination without abnormal findings: Secondary | ICD-10-CM

## 2021-03-01 DIAGNOSIS — E559 Vitamin D deficiency, unspecified: Secondary | ICD-10-CM

## 2021-03-01 DIAGNOSIS — Z1329 Encounter for screening for other suspected endocrine disorder: Secondary | ICD-10-CM

## 2021-03-01 DIAGNOSIS — Z124 Encounter for screening for malignant neoplasm of cervix: Secondary | ICD-10-CM

## 2021-03-01 DIAGNOSIS — Z114 Encounter for screening for human immunodeficiency virus [HIV]: Secondary | ICD-10-CM

## 2021-03-01 DIAGNOSIS — R5383 Other fatigue: Secondary | ICD-10-CM

## 2021-03-01 DIAGNOSIS — Z13220 Encounter for screening for lipoid disorders: Secondary | ICD-10-CM

## 2021-03-01 DIAGNOSIS — R7303 Prediabetes: Secondary | ICD-10-CM | POA: Diagnosis not present

## 2021-03-01 DIAGNOSIS — Z130 Encounter for screening for diseases of the blood and blood-forming organs and certain disorders involving the immune mechanism: Secondary | ICD-10-CM | POA: Diagnosis not present

## 2021-03-01 DIAGNOSIS — Z1159 Encounter for screening for other viral diseases: Secondary | ICD-10-CM

## 2021-03-02 ENCOUNTER — Encounter: Payer: Self-pay | Admitting: Family Medicine

## 2021-03-02 DIAGNOSIS — E559 Vitamin D deficiency, unspecified: Secondary | ICD-10-CM | POA: Insufficient documentation

## 2021-03-02 LAB — TSH: TSH: 2.22 u[IU]/mL (ref 0.35–4.50)

## 2021-03-02 LAB — LIPID PANEL
Cholesterol: 161 mg/dL (ref 0–200)
HDL: 58.1 mg/dL (ref >39.00)
LDL Cholesterol: 84 mg/dL (ref 0–99)
NonHDL: 102.4
Total CHOL/HDL Ratio: 3
Triglycerides: 94 mg/dL (ref 0.0–149.0)
VLDL: 18.8 mg/dL (ref 0.0–40.0)

## 2021-03-02 LAB — COMPREHENSIVE METABOLIC PANEL
ALT: 6 U/L (ref 0–35)
AST: 12 U/L (ref 0–37)
Albumin: 3.6 g/dL (ref 3.5–5.2)
Alkaline Phosphatase: 84 U/L (ref 39–117)
BUN: 14 mg/dL (ref 6–23)
CO2: 25 mEq/L (ref 19–32)
Calcium: 9.5 mg/dL (ref 8.4–10.5)
Chloride: 102 mEq/L (ref 96–112)
Creatinine, Ser: 0.7 mg/dL (ref 0.40–1.20)
GFR: 100.25 mL/min (ref >60.00)
Glucose, Bld: 84 mg/dL (ref 70–99)
Potassium: 4.1 mEq/L (ref 3.5–5.1)
Sodium: 138 mEq/L (ref 135–145)
Total Bilirubin: 0.5 mg/dL (ref 0.2–1.2)
Total Protein: 7.7 g/dL (ref 6.0–8.3)

## 2021-03-02 LAB — CBC
HCT: 31.5 % — ABNORMAL LOW (ref 36.0–46.0)
Hemoglobin: 9.9 g/dL — ABNORMAL LOW (ref 12.0–15.0)
MCHC: 31.4 g/dL (ref 30.0–36.0)
MCV: 71.9 fl — ABNORMAL LOW (ref 78.0–100.0)
Platelets: 386 10*3/uL (ref 150.0–400.0)
RBC: 4.39 Mil/uL (ref 3.87–5.11)
RDW: 19.2 % — ABNORMAL HIGH (ref 11.5–15.5)
WBC: 4.3 10*3/uL (ref 4.0–10.5)

## 2021-03-02 LAB — VITAMIN D 25 HYDROXY (VIT D DEFICIENCY, FRACTURES): VITD: 7.6 ng/mL — ABNORMAL LOW (ref 30.00–100.00)

## 2021-03-02 LAB — HEMOGLOBIN A1C: Hgb A1c MFr Bld: 6.2 % (ref 4.6–6.5)

## 2021-03-02 LAB — FERRITIN: Ferritin: 94.2 ng/mL (ref 10.0–291.0)

## 2021-03-02 LAB — HIV ANTIBODY (ROUTINE TESTING W REFLEX): HIV 1&2 Ab, 4th Generation: NONREACTIVE

## 2021-03-02 LAB — HEPATITIS C ANTIBODY
Hepatitis C Ab: NONREACTIVE
SIGNAL TO CUT-OFF: 0.04 (ref <1.00)

## 2021-03-02 MED ORDER — VITAMIN D3 1.25 MG (50000 UT) PO CAPS: ORAL_CAPSULE | ORAL | 0 refills | Status: DC

## 2021-03-02 NOTE — Addendum Note (Signed)
Addended by: Lamar Blinks C on: 03/02/2021 02:12 PM   Modules accepted: Orders

## 2021-03-03 ENCOUNTER — Other Ambulatory Visit: Payer: No Typology Code available for payment source

## 2021-03-03 ENCOUNTER — Ambulatory Visit: Payer: No Typology Code available for payment source

## 2021-03-03 ENCOUNTER — Other Ambulatory Visit: Payer: Self-pay

## 2021-03-03 DIAGNOSIS — M6281 Muscle weakness (generalized): Secondary | ICD-10-CM

## 2021-03-03 DIAGNOSIS — M25561 Pain in right knee: Secondary | ICD-10-CM | POA: Diagnosis not present

## 2021-03-03 DIAGNOSIS — G8929 Other chronic pain: Secondary | ICD-10-CM

## 2021-03-03 DIAGNOSIS — R2681 Unsteadiness on feet: Secondary | ICD-10-CM

## 2021-03-03 DIAGNOSIS — D509 Iron deficiency anemia, unspecified: Secondary | ICD-10-CM

## 2021-03-03 LAB — CYTOLOGY - PAP
Adequacy: ABSENT
Comment: NEGATIVE
Diagnosis: NEGATIVE
High risk HPV: NEGATIVE

## 2021-03-03 NOTE — Addendum Note (Signed)
Addended by: Kelle Darting A on: 03/03/2021 09:36 AM   Modules accepted: Orders

## 2021-03-04 ENCOUNTER — Ambulatory Visit: Payer: No Typology Code available for payment source

## 2021-03-04 ENCOUNTER — Other Ambulatory Visit: Payer: Self-pay

## 2021-03-04 DIAGNOSIS — R2681 Unsteadiness on feet: Secondary | ICD-10-CM

## 2021-03-04 DIAGNOSIS — M6281 Muscle weakness (generalized): Secondary | ICD-10-CM

## 2021-03-04 DIAGNOSIS — M25562 Pain in left knee: Secondary | ICD-10-CM

## 2021-03-04 DIAGNOSIS — M25561 Pain in right knee: Secondary | ICD-10-CM | POA: Diagnosis not present

## 2021-03-04 DIAGNOSIS — G8929 Other chronic pain: Secondary | ICD-10-CM

## 2021-03-04 NOTE — Therapy (Signed)
[place], [place], [address] Phone: [phone]   Fax:  [phone]  Physical Therapy Treatment  Patient Details  Name: Samantha Holden MRN: [id number] Date of Birth: 05-Feb-1971 Referring Provider (PT): Mcarthur Rossetti, MD   Encounter Date: 03/03/2021   PT End of Session - 03/03/21 1834     Visit Number 10    Number of Visits 17    Date for PT Re-Evaluation 03/18/21    Authorization Type U[employer]    PT Start Time 4268    PT Stop Time 1914    PT Time Calculation (min) 43 min    Activity Tolerance Patient tolerated treatment well;No increased pain    Behavior During Therapy WFL for tasks assessed/performed             Past Medical History:  Diagnosis Date   Allergy    Chicken pox     Past Surgical History:  Procedure Laterality Date   DILATION AND CURETTAGE, DIAGNOSTIC / THERAPEUTIC  1992   WISDOM TOOTH EXTRACTION      There were no vitals filed for this visit.   Subjective Assessment - 03/03/21 1835     Subjective Pt presents to PT with no current reports of bilateral knee pain. She notes that she feels like things are getting a little better, she is able to stand for longer periods at her desk at work. Pt is ready to begin PT treatment at this time.    Currently in Pain? No/denies    Pain Score 0-No pain                               [facility] Adult PT Treatment/Exercise - 03/04/21 0001       Ambulation/Gait   Ambulation/Gait Assistance Details amb 144ft using FWW; then 138ft at end of session wiht FWW; continued decrease in trunk extension and decreased bilateral knee ext, however less than previously      Knee/Hip Exercises: Stretches   Active Hamstring Stretch 60 seconds;Both    Active Hamstring Stretch Limitations seated      Knee/Hip Exercises: Aerobic   Nustep lvl 5 UE/LE x 5 min while taking subjective      Knee/Hip Exercises: Machines for  Strengthening   Total Gym Leg Press 3x10 55lbs      Knee/Hip Exercises: Standing   Hip Abduction 10 reps;Right    Abduction Limitations 5 reps L - heavy UE reliance    Other Standing Knee Exercises standing glute set x 10 - 5 sec hold    Other Standing Knee Exercises weight shift R<>L x 10 - 5 sec hold                      PT Short Term Goals - 01/21/21 0910       PT SHORT TERM GOAL #1   Title Pt will be independent with initial HEP    Baseline initial HEP given    Time 3    Period Weeks    Status New    Target Date 02/11/21               PT Long Term Goals - 01/26/21 1920       PT LONG TERM GOAL #1   Title Pt will decrease 5xSTS to no greater than 25 seconds in order to decrease fall risk and improve mobility    Baseline 49  seconds    Time 8    Period Weeks    Status New    Target Date 03/18/21      PT LONG TERM GOAL #2   Title Pt will improve bilateral knee AROM to in range of 5-115 degrees in order to improve mobility    Baseline see flowsheet    Time 8    Period Weeks    Status New    Target Date 03/18/21      PT LONG TERM GOAL #3   Title Pt will improve FOTO function score to no less than 55% in order to improve confidence and functional ability    Baseline 26% function    Time 8    Period Weeks    Status New    Target Date 03/18/21      PT LONG TERM GOAL #4   Title Pt will improve all LE MMT to no less than 4+/5 in order to improve mobility    Baseline see flowsheet    Time 8    Period Weeks    Status New    Target Date 03/18/21      PT LONG TERM GOAL #5   Title Pt will decrease TUG with LRAD to no greater than 25 seconds in order to improve functional mobility and decrease fall risk    Baseline 70 seconds    Time 8    Period Weeks    Status New    Target Date 03/18/21                   Plan - 03/04/21 0857     Clinical Impression Statement Pt was able to complete prescribed exercises and improved standing  tolerance and progressed to leg press today. She shows improving LE strength and knee ROM. Will continue to progress as tolerated.    PT Treatment/Interventions ADLs/Self Care Home Management;Electrical Stimulation;Cryotherapy;Moist Heat;Gait training;Stair training;Functional mobility training;Therapeutic activities;Therapeutic exercise;Neuromuscular re-education;Patient/family education;Manual techniques;Passive range of motion;Dry needling;Taping    PT Next Visit Plan continue to assess gait and progress LE strengthening and knee AROM as able    PT Home Exercise Plan Access Code [facility]             Patient will benefit from skilled therapeutic intervention in order to improve the following deficits and impairments:  Abnormal gait, Decreased activity tolerance, Decreased balance, Decreased endurance, Decreased mobility, Decreased range of motion, Decreased strength, Difficulty walking, Pain  Visit Diagnosis: Chronic pain of right knee  Chronic pain of left knee  Muscle weakness (generalized)  Unsteadiness on feet     Problem List Patient Active Problem List   Diagnosis Date Noted   Vitamin D deficiency 03/02/2021   Symptomatic mammary hypertrophy 04/28/2020   Shoulder pain 04/28/2020   Back pain 04/28/2020   Impingement syndrome of left ankle 12/30/2019   Prediabetes 12/23/2019   Right ankle pain 02/16/2018    Ward Chatters, PT, DPT 03/04/21 8:59 AM  [place] [facility] [address] [place], [place], [address] Phone: [phone]   Fax:  [phone]  Name: Samantha Holden MRN: [id number] Date of Birth: 05-09-1971

## 2021-03-05 LAB — HEMOGLOBINOPATHY EVALUATION
Fetal Hemoglobin Testing: <1 % (ref 0.0–1.9)
HCT: 33.8 % — ABNORMAL LOW (ref 35.0–45.0)
Hemoglobin A2 - HGBRFX: 2.1 % — ABNORMAL LOW (ref 2.2–3.2)
Hemoglobin: 10 g/dL — ABNORMAL LOW (ref 11.7–15.5)
Hgb A: 97.9 % (ref >96.0)
MCH: 21.9 pg — ABNORMAL LOW (ref 27.0–33.0)
MCV: 74.1 fL — ABNORMAL LOW (ref 80.0–100.0)
RBC: 4.56 10*6/uL (ref 3.80–5.10)
RDW: 17.2 % — ABNORMAL HIGH (ref 11.0–15.0)

## 2021-03-06 ENCOUNTER — Encounter: Payer: Self-pay | Admitting: Family Medicine

## 2021-03-06 DIAGNOSIS — D649 Anemia, unspecified: Secondary | ICD-10-CM

## 2021-03-07 NOTE — Therapy (Addendum)
[place], [place], [address] Phone: [phone]   Fax:  [phone]  Physical Therapy Treatment  Patient Details  Name: Samantha Holden MRN: [id number] Date of Birth: 1970-11-06 Referring Provider (PT): Mcarthur Rossetti, MD   Encounter Date: 03/04/2021   PT End of Session - 03/07/21 2109     Visit Number 11    Number of Visits 17    Date for PT Re-Evaluation 03/18/21    Authorization Type UHC    PT Start Time 2778    PT Stop Time 2423    PT Time Calculation (min) 45 min    Activity Tolerance Patient tolerated treatment well;No increased pain    Behavior During Therapy WFL for tasks assessed/performed             Past Medical History:  Diagnosis Date   Allergy    Chicken pox     Past Surgical History:  Procedure Laterality Date   DILATION AND CURETTAGE, DIAGNOSTIC / THERAPEUTIC  1992   WISDOM TOOTH EXTRACTION      There were no vitals filed for this visit.   Subjective Assessment - 03/07/21 2108     Subjective Pt presents to PT with no current reports of pain or discomfort. She has been compliant with her HEP with no adverse effect. Pt is ready to begin PT treatment at this time.                [facility] PT Assessment - 03/07/21 0001       AROM   Right Knee Extension 5    Right Knee Flexion 121    Left Knee Extension 7    Left Knee Flexion 120                           [facility] Adult PT Treatment/Exercise - 03/07/21 0001       Knee/Hip Exercises: Stretches   Active Hamstring Stretch 60 seconds;Both    Active Hamstring Stretch Limitations seated      Knee/Hip Exercises: Aerobic   Nustep lvl 5 UE/LE x 5 min while taking subjective      Knee/Hip Exercises: Machines for Strengthening   Cybex Knee Extension 2x10 15lb    Cybex Knee Flexion 2x10 25lb    Total Gym Leg Press 3x10 55lbs      Knee/Hip Exercises: Standing   Hip Abduction 10 reps;Right     Abduction Limitations 5 reps L - heavy UE reliance    Other Standing Knee Exercises standing glute set x 10 - 5 sec hold    Other Standing Knee Exercises weight shift R<>L x 10 - 5 sec hold      Knee/Hip Exercises: Supine   Bridges 2 sets;10 reps    Straight Leg Raises 10 reps;Both                      PT Short Term Goals - 01/21/21 0910       PT SHORT TERM GOAL #1   Title Pt will be independent with initial HEP    Baseline initial HEP given    Time 3    Period Weeks    Status New    Target Date 02/11/21               PT Long Term Goals - 01/26/21 1920       PT LONG TERM GOAL #1   Title  Pt will decrease 5xSTS to no greater than 25 seconds in order to decrease fall risk and improve mobility    Baseline 49 seconds    Time 8    Period Weeks    Status New    Target Date 03/18/21      PT LONG TERM GOAL #2   Title Pt will improve bilateral knee AROM to in range of 5-115 degrees in order to improve mobility    Baseline see flowsheet    Time 8    Period Weeks    Status New    Target Date 03/18/21      PT LONG TERM GOAL #3   Title Pt will improve FOTO function score to no less than 55% in order to improve confidence and functional ability    Baseline 26% function    Time 8    Period Weeks    Status New    Target Date 03/18/21      PT LONG TERM GOAL #4   Title Pt will improve all LE MMT to no less than 4+/5 in order to improve mobility    Baseline see flowsheet    Time 8    Period Weeks    Status New    Target Date 03/18/21      PT LONG TERM GOAL #5   Title Pt will decrease TUG with LRAD to no greater than 25 seconds in order to improve functional mobility and decrease fall risk    Baseline 70 seconds    Time 8    Period Weeks    Status New    Target Date 03/18/21                   Plan - 03/07/21 2007     Clinical Impression Statement Pt was able to complete prescribed exercises with no adverse effect. She shows improving  bilateral knee AROM compared to initial evaluation and is improving her mobility well overall. PT will continue to progress as able per POC.    PT Treatment/Interventions ADLs/Self Care Home Management;Electrical Stimulation;Cryotherapy;Moist Heat;Gait training;Stair training;Functional mobility training;Therapeutic activities;Therapeutic exercise;Neuromuscular re-education;Patient/family education;Manual techniques;Passive range of motion;Dry needling;Taping    PT Next Visit Plan continue to assess gait and progress LE strengthening and knee AROM as able    PT Home Exercise Plan Access Code [email or web address]    [email or web address] and Agree with Plan of Care Patient             Patient will benefit from skilled therapeutic intervention in order to improve the following deficits and impairments:  Abnormal gait, Decreased activity tolerance, Decreased balance, Decreased endurance, Decreased mobility, Decreased range of motion, Decreased strength, Difficulty walking, Pain  Visit Diagnosis: Chronic pain of right knee  Chronic pain of left knee  Muscle weakness (generalized)  Unsteadiness on feet     Problem List Patient Active Problem List   Diagnosis Date Noted   Vitamin D deficiency 03/02/2021   Symptomatic mammary hypertrophy 04/28/2020   Shoulder pain 04/28/2020   Back pain 04/28/2020   Impingement syndrome of left ankle 12/30/2019   Prediabetes 12/23/2019   Right ankle pain 02/16/2018    Ward Chatters, PT, DPT 03/07/21 9:09 PM  [place] [facility] [address] [place], [place], [address] Phone: [phone]   Fax:  [phone]  Name: Samantha Holden MRN: [id number] Date of Birth: 1971-01-17

## 2021-03-08 ENCOUNTER — Other Ambulatory Visit: Payer: Self-pay

## 2021-03-08 ENCOUNTER — Other Ambulatory Visit: Payer: Self-pay | Admitting: Podiatry

## 2021-03-08 ENCOUNTER — Ambulatory Visit: Payer: No Typology Code available for payment source

## 2021-03-09 ENCOUNTER — Ambulatory Visit: Payer: No Typology Code available for payment source

## 2021-03-09 DIAGNOSIS — M6281 Muscle weakness (generalized): Secondary | ICD-10-CM

## 2021-03-09 DIAGNOSIS — R2681 Unsteadiness on feet: Secondary | ICD-10-CM

## 2021-03-09 DIAGNOSIS — M25561 Pain in right knee: Secondary | ICD-10-CM | POA: Diagnosis not present

## 2021-03-09 DIAGNOSIS — M25562 Pain in left knee: Secondary | ICD-10-CM

## 2021-03-09 DIAGNOSIS — G8929 Other chronic pain: Secondary | ICD-10-CM

## 2021-03-09 NOTE — Therapy (Signed)
[place], [place], [address] Phone: [phone]   Fax:  [phone]  Physical Therapy Treatment  Patient Details  Name: Samantha Holden MRN: [id number] Date of Birth: 1971-07-29 Referring Provider (PT): Mcarthur Rossetti, MD   Encounter Date: 03/09/2021   PT End of Session - 03/09/21 1830     Visit Number 12    Number of Visits 17    Date for PT Re-Evaluation 03/18/21    Authorization Type UHC    PT Start Time 8127    PT Stop Time 1912    PT Time Calculation (min) 44 min    Activity Tolerance Patient tolerated treatment well;No increased pain    Behavior During Therapy WFL for tasks assessed/performed             Past Medical History:  Diagnosis Date   Allergy    Chicken pox     Past Surgical History:  Procedure Laterality Date   DILATION AND CURETTAGE, DIAGNOSTIC / THERAPEUTIC  1992   WISDOM TOOTH EXTRACTION      There were no vitals filed for this visit.   Subjective Assessment - 03/09/21 1830     Subjective Pt presents to PT with continued reports of L knee pain. She has continued to be compliant with HEP. Pt is ready to begin PT at this time.    Currently in Pain? Yes    Pain Score 4     Pain Location Knee    Pain Orientation Left                               [facility] Adult PT Treatment/Exercise - 03/09/21 0001       Ambulation/Gait   Gait Comments amb 239ft with FWW w/c follow; continued decrease bilatera knee pain      Knee/Hip Exercises: Stretches   Active Hamstring Stretch 60 seconds;Both    Active Hamstring Stretch Limitations seated    Passive Hamstring Stretch 60 seconds;Both    Passive Hamstring Stretch Limitations with strap      Knee/Hip Exercises: Aerobic   Nustep lvl 5 UE/LE x 4 min while taking subjective      Knee/Hip Exercises: Machines for Strengthening   Total Gym Leg Press 2x10 85lb      Knee/Hip Exercises: Standing   Other  Standing Knee Exercises lateral walk YTB x 3 laps at counter      Knee/Hip Exercises: Seated   Sit to Sand 3 sets;10 reps   high table level     Knee/Hip Exercises: Supine   Bridges Limitations bridge w/ red physioball at feet x 10    Straight Leg Raises 10 reps;Both                      PT Short Term Goals - 01/21/21 0910       PT SHORT TERM GOAL #1   Title Pt will be independent with initial HEP    Baseline initial HEP given    Time 3    Period Weeks    Status New    Target Date 02/11/21               PT Long Term Goals - 01/26/21 1920       PT LONG TERM GOAL #1   Title Pt will decrease 5xSTS to no greater than 25 seconds in order to decrease fall risk and improve  mobility    Baseline 49 seconds    Time 8    Period Weeks    Status New    Target Date 03/18/21      PT LONG TERM GOAL #2   Title Pt will improve bilateral knee AROM to in range of 5-115 degrees in order to improve mobility    Baseline see flowsheet    Time 8    Period Weeks    Status New    Target Date 03/18/21      PT LONG TERM GOAL #3   Title Pt will improve FOTO function score to no less than 55% in order to improve confidence and functional ability    Baseline 26% function    Time 8    Period Weeks    Status New    Target Date 03/18/21      PT LONG TERM GOAL #4   Title Pt will improve all LE MMT to no less than 4+/5 in order to improve mobility    Baseline see flowsheet    Time 8    Period Weeks    Status New    Target Date 03/18/21      PT LONG TERM GOAL #5   Title Pt will decrease TUG with LRAD to no greater than 25 seconds in order to improve functional mobility and decrease fall risk    Baseline 70 seconds    Time 8    Period Weeks    Status New    Target Date 03/18/21                   Plan - 03/09/21 1914     Clinical Impression Statement Pt was once again able to complete prescribed exercises with no adverse effect. She continues to improve her  functional mobility and STS with elevated table. She continues to benefit greatly from PT and will continue to be seen and progressed as tolerated.    PT Treatment/Interventions ADLs/Self Care Home Management;Electrical Stimulation;Cryotherapy;Moist Heat;Gait training;Stair training;Functional mobility training;Therapeutic activities;Therapeutic exercise;Neuromuscular re-education;Patient/family education;Manual techniques;Passive range of motion;Dry needling;Taping    PT Next Visit Plan continue to assess gait and progress LE strengthening and knee AROM as able    PT Home Exercise Plan Access Code [facility]             Patient will benefit from skilled therapeutic intervention in order to improve the following deficits and impairments:  Abnormal gait, Decreased activity tolerance, Decreased balance, Decreased endurance, Decreased mobility, Decreased range of motion, Decreased strength, Difficulty walking, Pain  Visit Diagnosis: Chronic pain of right knee  Chronic pain of left knee  Muscle weakness (generalized)  Unsteadiness on feet     Problem List Patient Active Problem List   Diagnosis Date Noted   Vitamin D deficiency 03/02/2021   Symptomatic mammary hypertrophy 04/28/2020   Shoulder pain 04/28/2020   Back pain 04/28/2020   Impingement syndrome of left ankle 12/30/2019   Prediabetes 12/23/2019   Right ankle pain 02/16/2018    Ward Chatters, PT, DPT 03/09/21 7:16 PM  [facility] [address] [place], [place], [address] Phone: [phone]   Fax:  [phone]  Name: Samantha Holden MRN: [id number] Date of Birth: September 02, 1971

## 2021-03-10 ENCOUNTER — Ambulatory Visit: Payer: No Typology Code available for payment source

## 2021-03-10 ENCOUNTER — Telehealth: Payer: Self-pay | Admitting: *Deleted

## 2021-03-10 ENCOUNTER — Other Ambulatory Visit: Payer: Self-pay

## 2021-03-10 DIAGNOSIS — G8929 Other chronic pain: Secondary | ICD-10-CM

## 2021-03-10 DIAGNOSIS — M6281 Muscle weakness (generalized): Secondary | ICD-10-CM

## 2021-03-10 DIAGNOSIS — R2681 Unsteadiness on feet: Secondary | ICD-10-CM

## 2021-03-10 DIAGNOSIS — M25561 Pain in right knee: Secondary | ICD-10-CM

## 2021-03-10 NOTE — Therapy (Signed)
[place], [place], [address] Phone: [phone]   Fax:  [phone]  Physical Therapy Treatment  Patient Details  Name: Samantha Holden MRN: [id number] Date of Birth: 1971-05-31 Referring Provider (PT): Mcarthur Rossetti, MD   Encounter Date: 03/10/2021   PT End of Session - 03/10/21 1833     Visit Number 13    Number of Visits 17    Date for PT Re-Evaluation 03/18/21    Authorization Type UHC    PT Start Time 3810    PT Stop Time 1910    PT Time Calculation (min) 40 min    Activity Tolerance Patient tolerated treatment well;No increased pain    Behavior During Therapy WFL for tasks assessed/performed             Past Medical History:  Diagnosis Date   Allergy    Chicken pox     Past Surgical History:  Procedure Laterality Date   DILATION AND CURETTAGE, DIAGNOSTIC / THERAPEUTIC  1992   WISDOM TOOTH EXTRACTION      There were no vitals filed for this visit.   Subjective Assessment - 03/10/21 1833     Subjective Pt presents to PT noting decreased pain today. She is ready to begin PT treatment at this time.    Currently in Pain? Yes    Pain Score 1     Pain Location Knee    Pain Orientation Left                               [facility] Adult PT Treatment/Exercise - 03/10/21 0001       Ambulation/Gait   Gait Comments amb 265ft with FWW w/c follow; continued decrease bilatera knee pain      Knee/Hip Exercises: Stretches   Passive Hamstring Stretch 60 seconds;Both      Knee/Hip Exercises: Aerobic   Nustep lvl 5 UE/LE x 5 min while taking subjective      Knee/Hip Exercises: Machines for Strengthening   Cybex Knee Extension 2x10 20lb    Cybex Knee Flexion 2x10 35lb    Total Gym Leg Press 3x10 85lb      Knee/Hip Exercises: Standing   Functional Squat Limitations 2x10 holding freemotion handle      Knee/Hip Exercises: Seated   Sit to Sand 3 sets;10 reps    holding 10lb DB                     PT Short Term Goals - 01/21/21 0910       PT SHORT TERM GOAL #1   Title Pt will be independent with initial HEP    Baseline initial HEP given    Time 3    Period Weeks    Status New    Target Date 02/11/21               PT Long Term Goals - 01/26/21 1920       PT LONG TERM GOAL #1   Title Pt will decrease 5xSTS to no greater than 25 seconds in order to decrease fall risk and improve mobility    Baseline 49 seconds    Time 8    Period Weeks    Status New    Target Date 03/18/21      PT LONG TERM GOAL #2   Title Pt will improve bilateral knee AROM to in range of 5-115  degrees in order to improve mobility    Baseline see flowsheet    Time 8    Period Weeks    Status New    Target Date 03/18/21      PT LONG TERM GOAL #3   Title Pt will improve FOTO function score to no less than 55% in order to improve confidence and functional ability    Baseline 26% function    Time 8    Period Weeks    Status New    Target Date 03/18/21      PT LONG TERM GOAL #4   Title Pt will improve all LE MMT to no less than 4+/5 in order to improve mobility    Baseline see flowsheet    Time 8    Period Weeks    Status New    Target Date 03/18/21      PT LONG TERM GOAL #5   Title Pt will decrease TUG with LRAD to no greater than 25 seconds in order to improve functional mobility and decrease fall risk    Baseline 70 seconds    Time 8    Period Weeks    Status New    Target Date 03/18/21                   Plan - 03/10/21 1915     Clinical Impression Statement Pt was able to complete all prescribed exercises with no adverse effect. She continues to progress well with PT, with today's session increasing difficulty with LE strengthening. PT will continue to progress exercises as tolerated per POC as prescribed.    PT Treatment/Interventions ADLs/Self Care Home Management;Electrical Stimulation;Cryotherapy;Moist Heat;Gait  training;Stair training;Functional mobility training;Therapeutic activities;Therapeutic exercise;Neuromuscular re-education;Patient/family education;Manual techniques;Passive range of motion;Dry needling;Taping    PT Next Visit Plan continue to assess gait and progress LE strengthening and knee AROM as able    PT Home Exercise Plan Access Code [facility]             Patient will benefit from skilled therapeutic intervention in order to improve the following deficits and impairments:  Abnormal gait, Decreased activity tolerance, Decreased balance, Decreased endurance, Decreased mobility, Decreased range of motion, Decreased strength, Difficulty walking, Pain  Visit Diagnosis: Chronic pain of right knee  Chronic pain of left knee  Muscle weakness (generalized)  Unsteadiness on feet     Problem List Patient Active Problem List   Diagnosis Date Noted   Vitamin D deficiency 03/02/2021   Symptomatic mammary hypertrophy 04/28/2020   Shoulder pain 04/28/2020   Back pain 04/28/2020   Impingement syndrome of left ankle 12/30/2019   Prediabetes 12/23/2019   Right ankle pain 02/16/2018    Ward Chatters, PT, DPT 03/10/21 7:19 PM  [place] [address] [place], [place], [address] Phone: [phone]   Fax:  [phone]  Name: Samantha Holden MRN: [id number] Date of Birth: 01-16-1971

## 2021-03-10 NOTE — Telephone Encounter (Signed)
Per referral Dr. Lorelei Pont - called and lvm of upcoming appointments - rquested callback to confirm - mailed welcome packet with calendar

## 2021-03-17 ENCOUNTER — Other Ambulatory Visit: Payer: Self-pay

## 2021-03-17 ENCOUNTER — Ambulatory Visit: Payer: No Typology Code available for payment source | Attending: Family Medicine

## 2021-03-17 DIAGNOSIS — G8929 Other chronic pain: Secondary | ICD-10-CM

## 2021-03-17 DIAGNOSIS — M25561 Pain in right knee: Secondary | ICD-10-CM | POA: Insufficient documentation

## 2021-03-17 DIAGNOSIS — R2681 Unsteadiness on feet: Secondary | ICD-10-CM | POA: Diagnosis present

## 2021-03-17 DIAGNOSIS — M25562 Pain in left knee: Secondary | ICD-10-CM | POA: Insufficient documentation

## 2021-03-17 DIAGNOSIS — M6281 Muscle weakness (generalized): Secondary | ICD-10-CM | POA: Diagnosis present

## 2021-03-17 NOTE — Therapy (Signed)
[place], [place], [address] Phone: [phone]   Fax:  [phone]  Physical Therapy Treatment  Patient Details  Name: Samantha Holden MRN: [id number] Date of Birth: May 03, 1971 Referring Provider (PT): Mcarthur Rossetti, MD   Encounter Date: 03/17/2021   PT End of Session - 03/17/21 1653     Visit Number 14    Number of Visits 17    Date for PT Re-Evaluation 03/18/21    Authorization Type UHC    PT Start Time 1700    PT Stop Time 1742    PT Time Calculation (min) 42 min    Activity Tolerance Patient tolerated treatment well;No increased pain    Behavior During Therapy WFL for tasks assessed/performed             Past Medical History:  Diagnosis Date   Allergy    Chicken pox     Past Surgical History:  Procedure Laterality Date   DILATION AND CURETTAGE, DIAGNOSTIC / THERAPEUTIC  1992   WISDOM TOOTH EXTRACTION      There were no vitals filed for this visit.   Subjective Assessment - 03/17/21 1654     Subjective Pt presents to PT with reports of L knee pain. She has been compliant with her HEP with no adverse effect. Pt is ready to begin PT at this time.    Currently in Pain? Yes    Pain Score 3     Pain Location Knee    Pain Orientation Left                               [facility] Adult PT Treatment/Exercise - 03/17/21 0001       Knee/Hip Exercises: Stretches   Passive Hamstring Stretch 60 seconds;Both      Knee/Hip Exercises: Aerobic   Nustep lvl 5 UE/LE x 5 min while taking subjective      Knee/Hip Exercises: Machines for Strengthening   Cybex Knee Extension 2x10 25lb    Cybex Knee Flexion 2x10 35lb    Total Gym Leg Press 3x10 75lb      Knee/Hip Exercises: Seated   Sit to Sand 2 sets;10 reps   holding 10lb KB     Knee/Hip Exercises: Supine   Bridges 2 sets;10 reps                      PT Short Term Goals - 01/21/21 0910       PT  SHORT TERM GOAL #1   Title Pt will be independent with initial HEP    Baseline initial HEP given    Time 3    Period Weeks    Status New    Target Date 02/11/21               PT Long Term Goals - 01/26/21 1920       PT LONG TERM GOAL #1   Title Pt will decrease 5xSTS to no greater than 25 seconds in order to decrease fall risk and improve mobility    Baseline 49 seconds    Time 8    Period Weeks    Status New    Target Date 03/18/21      PT LONG TERM GOAL #2   Title Pt will improve bilateral knee AROM to in range of 5-115 degrees in order to improve mobility    Baseline see flowsheet  Time 8    Period Weeks    Status New    Target Date 03/18/21      PT LONG TERM GOAL #3   Title Pt will improve FOTO function score to no less than 55% in order to improve confidence and functional ability    Baseline 26% function    Time 8    Period Weeks    Status New    Target Date 03/18/21      PT LONG TERM GOAL #4   Title Pt will improve all LE MMT to no less than 4+/5 in order to improve mobility    Baseline see flowsheet    Time 8    Period Weeks    Status New    Target Date 03/18/21      PT LONG TERM GOAL #5   Title Pt will decrease TUG with LRAD to no greater than 25 seconds in order to improve functional mobility and decrease fall risk    Baseline 70 seconds    Time 8    Period Weeks    Status New    Target Date 03/18/21                   Plan - 03/17/21 1751     Clinical Impression Statement Pt was able to complete prescribed exercises with no adverse effect or increase in pain. She continues to have limited ambulatory ability and decreased bilateral knee ROM. PT will assess goals and progress at next session to determine appropriate steps in POC. Will otherwise progress as able.    PT Treatment/Interventions ADLs/Self Care Home Management;Electrical Stimulation;Cryotherapy;Moist Heat;Gait training;Stair training;Functional mobility training;Therapeutic  activities;Therapeutic exercise;Neuromuscular re-education;Patient/family education;Manual techniques;Passive range of motion;Dry needling;Taping    PT Next Visit Plan assess goals, progress HEP    PT Home Exercise Plan Access Code [facility]             Patient will benefit from skilled therapeutic intervention in order to improve the following deficits and impairments:  Abnormal gait, Decreased activity tolerance, Decreased balance, Decreased endurance, Decreased mobility, Decreased range of motion, Decreased strength, Difficulty walking, Pain  Visit Diagnosis: Chronic pain of right knee  Chronic pain of left knee  Muscle weakness (generalized)  Unsteadiness on feet     Problem List Patient Active Problem List   Diagnosis Date Noted   Vitamin D deficiency 03/02/2021   Symptomatic mammary hypertrophy 04/28/2020   Shoulder pain 04/28/2020   Back pain 04/28/2020   Impingement syndrome of left ankle 12/30/2019   Prediabetes 12/23/2019   Right ankle pain 02/16/2018    Ward Chatters, PT, DPT 03/17/21 5:53 PM  [place] [facility] [address] [place], [place], [address] Phone: [phone]   Fax:  [phone]  Name: Samantha Holden MRN: [id number] Date of Birth: [birth year]

## 2021-03-18 ENCOUNTER — Ambulatory Visit: Payer: No Typology Code available for payment source

## 2021-03-18 DIAGNOSIS — M6281 Muscle weakness (generalized): Secondary | ICD-10-CM

## 2021-03-18 DIAGNOSIS — R2681 Unsteadiness on feet: Secondary | ICD-10-CM

## 2021-03-18 DIAGNOSIS — G8929 Other chronic pain: Secondary | ICD-10-CM

## 2021-03-18 DIAGNOSIS — M25561 Pain in right knee: Secondary | ICD-10-CM | POA: Diagnosis not present

## 2021-03-18 DIAGNOSIS — M25562 Pain in left knee: Secondary | ICD-10-CM

## 2021-03-18 NOTE — Therapy (Signed)
[place], [place], [address] Phone: [phone]   Fax:  [phone]  Physical Therapy Treatment/Discharge  Patient Details  Name: Samantha Holden MRN: [id number] Date of Birth: 11/06/1970 Referring Provider (PT): Mcarthur Rossetti, MD   Encounter Date: 03/18/2021   PT End of Session - 03/18/21 1611     Visit Number 15    Number of Visits 17    Date for PT Re-Evaluation 03/18/21    Authorization Type UHC    PT Start Time 2094    PT Stop Time 7096    PT Time Calculation (min) 43 min    Activity Tolerance Patient tolerated treatment well;No increased pain    Behavior During Therapy WFL for tasks assessed/performed             Past Medical History:  Diagnosis Date   Allergy    Chicken pox     Past Surgical History:  Procedure Laterality Date   DILATION AND CURETTAGE, DIAGNOSTIC / THERAPEUTIC  1992   WISDOM TOOTH EXTRACTION      There were no vitals filed for this visit.   Subjective Assessment - 03/18/21 1611     Subjective Pt presents to PT with reports of slight L knee pain. She is ready to begin PT at this time.    Currently in Pain? Yes    Pain Score 3     Pain Location Knee    Pain Orientation Left                [facility] PT Assessment - 03/18/21 0001       Observation/Other Assessments   Focus on Therapeutic Outcomes (FOTO)  49% function      AROM   Right Knee Extension 5    Right Knee Flexion 121    Left Knee Extension 7    Left Knee Flexion 119      Strength   Right Hip Flexion 4+/5    Right Hip ABduction 3+/5    Right Hip ADduction 3+/5    Left Hip Flexion 3+/5    Left Hip ABduction 3+/5    Left Hip ADduction 3+/5    Right Knee Flexion 4+/5    Right Knee Extension 5/5    Left Knee Flexion 4+/5    Left Knee Extension 4/5      Transfers   Five time sit to stand comments  28 seconds                           [facility] Adult PT  Treatment/Exercise - 03/18/21 0001       Ambulation/Gait   Gait Comments amb 59f with SPC CGA; continued decrease bilatera knee pain      Knee/Hip Exercises: Stretches   Active Hamstring Stretch Limitations x 30 sec ea seated    Passive Hamstring Stretch 60 seconds;Both    Passive Hamstring Stretch Limitations with strap      Knee/Hip Exercises: Aerobic   Nustep lvl 5 UE/LE x 5 min while taking subjective      Knee/Hip Exercises: Seated   Sit to Sand 10 reps;without UE support   4in platform     Knee/Hip Exercises: Supine   Bridges 10 reps    Straight Leg Raises 10 reps;Both    Other Supine Knee/Hip Exercises supine clamshell black tband x 15  PT Education - 03/18/21 1712     Education Details HEP    Person(s) Educated Patient    Methods Demonstration;Handout;Explanation    Comprehension Verbalized understanding;Returned demonstration              PT Short Term Goals - 03/18/21 1701       PT SHORT TERM GOAL #1   Title Pt will be independent with initial HEP    Baseline initial HEP given    Time 3    Period Weeks    Status Achieved    Target Date 02/11/21               PT Long Term Goals - 03/18/21 1701       PT LONG TERM GOAL #1   Title Pt will decrease 5xSTS to no greater than 25 seconds in order to decrease fall risk and improve mobility    Baseline 49 seconds; 03/18/21: 28 seconds    Time 8    Period Weeks    Status Partially Met      PT LONG TERM GOAL #2   Title Pt will improve bilateral knee AROM to in range of 5-115 degrees in order to improve mobility    Baseline see flowsheet    Time 8    Period Weeks    Status Partially Met      PT LONG TERM GOAL #3   Title Pt will improve FOTO function score to no less than 55% in order to improve confidence and functional ability    Baseline 26% function; 03/18/21: 49%    Time 8    Period Weeks    Status New      PT LONG TERM GOAL #4   Title Pt will improve all LE MMT  to no less than 4+/5 in order to improve mobility    Baseline see flowsheet    Time 8    Period Weeks    Status Partially Met      PT LONG TERM GOAL #5   Title Pt will decrease TUG with LRAD to no greater than 25 seconds in order to improve functional mobility and decrease fall risk    Baseline 70 seconds; 03/18/21: 40 seconds w/ FWW    Time 8    Period Weeks    Status Partially Met                   Plan - 03/18/21 1709     Clinical Impression Statement Pt was able to complete prescribed exercises with no adverse effect and demonstrated knowledge of HEP. Over the course of PT treatment, she has been able to make great progress with AROM bilaterally and improved her 5xSTS and TUG scores, showing improved mobility and decreased fall risk. Her FOTO score also greatly improved, increasing form 26% at eval to 49% today. Pt has somewhat hit a plateau in progress recently d/t continued significant L knee pain and will soon see referring MD. PT updated HEP and will discharge for now until visit with MD for guidance on next steps of care for. Pt is in agreement with current plan.    PT Treatment/Interventions ADLs/Self Care Home Management;Electrical Stimulation;Cryotherapy;Moist Heat;Gait training;Stair training;Functional mobility training;Therapeutic activities;Therapeutic exercise;Neuromuscular re-education;Patient/family education;Manual techniques;Passive range of motion;Dry needling;Taping    PT Home Exercise Plan Access Code [place] and Agree with Plan of Care Patient             Patient will benefit from skilled therapeutic  intervention in order to improve the following deficits and impairments:  Abnormal gait, Decreased activity tolerance, Decreased balance, Decreased endurance, Decreased mobility, Decreased range of motion, Decreased strength, Difficulty walking, Pain  Visit Diagnosis: Chronic pain of right knee  Chronic pain of left knee  Muscle weakness  (generalized)  Unsteadiness on feet     Problem List Patient Active Problem List   Diagnosis Date Noted   Vitamin D deficiency 03/02/2021   Symptomatic mammary hypertrophy 04/28/2020   Shoulder pain 04/28/2020   Back pain 04/28/2020   Impingement syndrome of left ankle 12/30/2019   Prediabetes 12/23/2019   Right ankle pain 02/16/2018    Ward Chatters, PT, DPT 03/18/21 5:16 PM  [place] [facility] [address] [place], [place], [address] Phone: [phone]   Fax:  [phone]  Name: Samantha Holden MRN: [id number] Date of Birth: December 10, 1970  PHYSICAL THERAPY DISCHARGE SUMMARY  Visits from Start of Care: 15  Current functional level related to goals / functional outcomes: See goals and objective   Remaining deficits: See goals and objective   Education / Equipment: HEP   Patient agrees to discharge. Patient goals were partially met. Patient is being discharged due to  plateau in progress, will hold until upcoming MD visit.

## 2021-03-29 ENCOUNTER — Ambulatory Visit: Payer: No Typology Code available for payment source | Admitting: Orthopaedic Surgery

## 2021-04-09 ENCOUNTER — Other Ambulatory Visit: Payer: Self-pay | Admitting: Family

## 2021-04-09 DIAGNOSIS — D649 Anemia, unspecified: Secondary | ICD-10-CM

## 2021-04-12 ENCOUNTER — Inpatient Hospital Stay (HOSPITAL_BASED_OUTPATIENT_CLINIC_OR_DEPARTMENT_OTHER): Payer: No Typology Code available for payment source | Admitting: Family

## 2021-04-12 ENCOUNTER — Telehealth: Payer: Self-pay | Admitting: *Deleted

## 2021-04-12 ENCOUNTER — Encounter: Payer: Self-pay | Admitting: Family

## 2021-04-12 ENCOUNTER — Other Ambulatory Visit: Payer: Self-pay

## 2021-04-12 ENCOUNTER — Inpatient Hospital Stay: Payer: No Typology Code available for payment source | Attending: Hematology & Oncology

## 2021-04-12 VITALS — BP 121/79 | HR 83 | Temp 98.0°F | Resp 18 | Ht 71.0 in | Wt 271.4 lb

## 2021-04-12 DIAGNOSIS — D509 Iron deficiency anemia, unspecified: Secondary | ICD-10-CM | POA: Insufficient documentation

## 2021-04-12 DIAGNOSIS — D649 Anemia, unspecified: Secondary | ICD-10-CM

## 2021-04-12 LAB — CBC WITH DIFFERENTIAL (CANCER CENTER ONLY)
Abs Immature Granulocytes: 0.05 10*3/uL (ref 0.00–0.07)
Basophils Absolute: 0 10*3/uL (ref 0.0–0.1)
Basophils Relative: 0 %
Eosinophils Absolute: 0.1 10*3/uL (ref 0.0–0.5)
Eosinophils Relative: 2 %
HCT: 32.4 % — ABNORMAL LOW (ref 36.0–46.0)
Hemoglobin: 9.7 g/dL — ABNORMAL LOW (ref 12.0–15.0)
Immature Granulocytes: 1 %
Lymphocytes Relative: 27 %
Lymphs Abs: 1.4 10*3/uL (ref 0.7–4.0)
MCH: 21.9 pg — ABNORMAL LOW (ref 26.0–34.0)
MCHC: 29.9 g/dL — ABNORMAL LOW (ref 30.0–36.0)
MCV: 73.3 fL — ABNORMAL LOW (ref 80.0–100.0)
Monocytes Absolute: 0.4 10*3/uL (ref 0.1–1.0)
Monocytes Relative: 8 %
Neutro Abs: 3.3 10*3/uL (ref 1.7–7.7)
Neutrophils Relative %: 62 %
Platelet Count: 363 10*3/uL (ref 150–400)
RBC: 4.42 MIL/uL (ref 3.87–5.11)
RDW: 17.1 % — ABNORMAL HIGH (ref 11.5–15.5)
WBC Count: 5.2 10*3/uL (ref 4.0–10.5)
nRBC: 0 % (ref 0.0–0.2)

## 2021-04-12 LAB — CMP (CANCER CENTER ONLY)
ALT: 6 U/L (ref 0–44)
AST: 10 U/L — ABNORMAL LOW (ref 15–41)
Albumin: 3.6 g/dL (ref 3.5–5.0)
Alkaline Phosphatase: 85 U/L (ref 38–126)
Anion gap: 8 (ref 5–15)
BUN: 18 mg/dL (ref 6–20)
CO2: 27 mmol/L (ref 22–32)
Calcium: 9.8 mg/dL (ref 8.9–10.3)
Chloride: 104 mmol/L (ref 98–111)
Creatinine: 0.76 mg/dL (ref 0.44–1.00)
GFR, Estimated: >60 mL/min (ref >60)
Glucose, Bld: 101 mg/dL — ABNORMAL HIGH (ref 70–99)
Potassium: 4.1 mmol/L (ref 3.5–5.1)
Sodium: 139 mmol/L (ref 135–145)
Total Bilirubin: 0.3 mg/dL (ref 0.3–1.2)
Total Protein: 8.1 g/dL (ref 6.5–8.1)

## 2021-04-12 LAB — RETICULOCYTES
Immature Retic Fract: 15 % (ref 2.3–15.9)
RBC.: 4.46 MIL/uL (ref 3.87–5.11)
Retic Count, Absolute: 33 10*3/uL (ref 19.0–186.0)
Retic Ct Pct: 0.7 % (ref 0.4–3.1)

## 2021-04-12 LAB — IRON AND TIBC
Iron: 30 ug/dL — ABNORMAL LOW (ref 41–142)
Saturation Ratios: 9 % — ABNORMAL LOW (ref 21–57)
TIBC: 346 ug/dL (ref 236–444)
UIBC: 316 ug/dL (ref 120–384)

## 2021-04-12 LAB — FERRITIN: Ferritin: 127 ng/mL (ref 11–307)

## 2021-04-12 LAB — LACTATE DEHYDROGENASE: LDH: 158 U/L (ref 98–192)

## 2021-04-12 LAB — SAVE SMEAR(SSMR), FOR PROVIDER SLIDE REVIEW

## 2021-04-12 NOTE — Progress Notes (Signed)
Hematology/Oncology Consultation   Name: Samantha Holden      MRN: [id number]    Location: Room/bed info not found  Date: 04/12/2021 Time:9:14 AM   REFERRING PHYSICIAN:  Jessica C. Copland, MD  REASON FOR CONSULT: Anemia, unspecified    DIAGNOSIS: Anemia, unspecified, labs pending   HISTORY OF PRESENT ILLNESS: Samantha Holden is a very pleasant 50 yo African American female with history of intermittent anemia.  Hgb is 9.7, MCV 73, Platelets 363 and WBC count 5.2.  She states that she took an oral iron supplement briefly while in college.  She has never received a transfusion or IV iron.  She notes fatigue as well as chewing lots of ice.  Her sister also has history of anemia.  She had 2 wisdom teeth removed and a D&C in the past without any complications.  She has not had a cycle in over a year.  No blood loss noted. No abnormal bruising, no petechiae.  She has occasional night sweats.  She has 3 children and history of 1 miscarriage within the first 8 week.  She had her colonoscopy in May 2021 with Dr. Bryan Holden. She had 5 polyps removed from the sigmoid colon and a few small polyps remover in the rectum. All were benign.  She states that she is due for her mammogram this month.  She is prediabetic. No history of thyroid disease.  No fever, chills, n/v, cough, rash, dizziness, SOB, chest pain, palpitations, abdominal pain or changes in bowel or bladder habits.  She has numbness and tingling in her feet that waxes and wanes.  She has osteoarthritis in the both knees and ankles.  No falls or syncope to report. She uses a wheelchair at home as well as a cane and walker when needed.  No smoking or recreational drug use.  She has has the occasional glass of wine.  She has maintained a good appetite and is not a vegetarian though she does not eat a lot of meat. She is staying well hydrated. Her weight is stable at 271 lbs.  She works in [employer] for the [employer].   ROS: All other 10 point  review of systems is negative.   PAST MEDICAL HISTORY:   Past Medical History:  Diagnosis Date   Allergy    Chicken pox     ALLERGIES: No Known Allergies    MEDICATIONS:  Current Outpatient Medications on File Prior to Visit  Medication Sig Dispense Refill   Cholecalciferol (VITAMIN D3) 1.25 MG (50000 UT) CAPS Take 1 weekly for 12 weeks 12 capsule 0   meloxicam (MOBIC) 15 MG tablet TAKE 1 TABLET BY MOUTH EVERY DAY 30 tablet 1   No current facility-administered medications on file prior to visit.     PAST SURGICAL HISTORY Past Surgical History:  Procedure Laterality Date   DILATION AND CURETTAGE, DIAGNOSTIC / THERAPEUTIC  1992   WISDOM TOOTH EXTRACTION      FAMILY HISTORY: Family History  Problem Relation Age of Onset   Breast cancer Mother    Arthritis Mother    Hypertension Mother    Colon polyps Mother    Cancer Father    Bladder Cancer Father    Heart attack Father    Arthritis Maternal Grandmother    Heart disease Maternal Grandfather    Arthritis Maternal Grandfather    Heart attack Maternal Grandfather    Hypertension Maternal Grandfather    Colon cancer Neg Hx    Esophageal cancer Neg Hx  Stomach cancer Neg Hx    Rectal cancer Neg Hx     SOCIAL HISTORY:  reports that she has never smoked. She has never used smokeless tobacco. She reports that she does not drink alcohol and does not use drugs.  PERFORMANCE STATUS: The patient's performance status is 1 - Symptomatic but completely ambulatory  PHYSICAL EXAM: Most Recent Vital Signs: There were no vitals taken for this visit. BP 121/79 (BP Location: Right Arm, Patient Position: Sitting)   Pulse 83   Temp 98 F (36.7 C) (Oral)   Resp 18   Ht '5\' 11"'$  (1.803 m)   Wt 271 lb 6.4 oz (123.1 kg)   SpO2 100%   BMI 37.85 kg/m   General Appearance:    Alert, cooperative, no distress, appears stated age  Head:    Normocephalic, without obvious abnormality, atraumatic  Eyes:    PERRL, conjunctiva/corneas  clear, EOM's intact, fundi    benign, both eyes  Ears:    Normal TM's and external ear canals, both ears  Nose:   Nares normal, septum midline, mucosa normal, no drainage    or sinus tenderness  Throat:   Lips, mucosa, and tongue normal; teeth and gums normal  Neck:   Supple, symmetrical, trachea midline, no adenopathy;    thyroid:  no enlargement/tenderness/nodules; no carotid   bruit or JVD  Back:     Symmetric, no curvature, ROM normal, no CVA tenderness  Lungs:     Clear to auscultation bilaterally, respirations unlabored  Chest Wall:    No tenderness or deformity   Heart:    Regular rate and rhythm, S1 and S2 normal, no murmur, rub   or gallop  Breast Exam:    No tenderness, masses, or nipple abnormality  Abdomen:     Soft, non-tender, bowel sounds active all four quadrants,    no masses, no organomegaly  Genitalia:    Normal female without lesion, discharge or tenderness  Rectal:    Normal tone, normal prostate, no masses or tenderness;   guaiac negative stool  Extremities:   Extremities normal, atraumatic, no cyanosis or edema  Pulses:   2+ and symmetric all extremities  Skin:   Skin color, texture, turgor normal, no rashes or lesions  Lymph nodes:   Cervical, supraclavicular, and axillary nodes normal  Neurologic:   CNII-XII intact, normal strength, sensation and reflexes    throughout    LABORATORY DATA:  Results for orders placed or performed in visit on 04/12/21 (from the past 48 hour(s))  CBC with Differential (Cancer Center Only)     Status: Abnormal   Collection Time: 04/12/21  8:58 AM  Result Value Ref Range   WBC Count 5.2 4.0 - 10.5 K/uL   RBC 4.42 3.87 - 5.11 MIL/uL   Hemoglobin 9.7 (L) 12.0 - 15.0 g/dL    Comment: Reticulocyte Hemoglobin testing may be clinically indicated, consider ordering this additional test [id number]    HCT 32.4 (L) 36.0 - 46.0 %   MCV 73.3 (L) 80.0 - 100.0 fL   MCH 21.9 (L) 26.0 - 34.0 pg   MCHC 29.9 (L) 30.0 - 36.0 g/dL   RDW 17.1  (H) 11.5 - 15.5 %   Platelet Count 363 150 - 400 K/uL   nRBC 0.0 0.0 - 0.2 %   Neutrophils Relative % 62 %   Neutro Abs 3.3 1.7 - 7.7 K/uL   Lymphocytes Relative 27 %   Lymphs Abs 1.4 0.7 - 4.0 K/uL   Monocytes Relative  8 %   Monocytes Absolute 0.4 0.1 - 1.0 K/uL   Eosinophils Relative 2 %   Eosinophils Absolute 0.1 0.0 - 0.5 K/uL   Basophils Relative 0 %   Basophils Absolute 0.0 0.0 - 0.1 K/uL   Immature Granulocytes 1 %   Abs Immature Granulocytes 0.05 0.00 - 0.07 K/uL    Comment: Performed at [facility] at [facility], [address] [place], [place] [address]  Reticulocytes     Status: None   Collection Time: 04/12/21  8:58 AM  Result Value Ref Range   Retic Ct Pct 0.7 0.4 - 3.1 %   RBC. 4.46 3.87 - 5.11 MIL/uL   Retic Count, Absolute 33.0 19.0 - 186.0 K/uL   Immature Retic Fract 15.0 2.3 - 15.9 %    Comment: Performed at [facility] at [facility], [address], [place], [place] [address]  Save Smear [facility])     Status: None   Collection Time: 04/12/21  8:58 AM  Result Value Ref Range   Smear Review SMEAR STAINED AND AVAILABLE FOR REVIEW     Comment: Performed at [facility] Lab at [facility], [address], [place], [place] [address]      RADIOGRAPHY: No results found.     PATHOLOGY: None  ASSESSMENT/PLAN: Ms. Kopec is a very pleasant 50 yo African American female with history of intermittent anemia.  Iron studies are pending along with alpha thalassemia. We will see if she would benefit from IV iron as well as possibly daily folic acid supplementation.  Follow-up in 8 weeks.   All questions were answered. The patient knows to call the clinic with any problems, questions or concerns. We can certainly see the patient much sooner if necessary.  The patient was discussed with Dr. Marin Olp and he is in agreement with the aforementioned.   Laverna Peace, NP

## 2021-04-12 NOTE — Telephone Encounter (Signed)
Per 04/12/21 los - gave patient upcoming appointments - confirmed

## 2021-04-13 ENCOUNTER — Other Ambulatory Visit: Payer: Self-pay | Admitting: Family

## 2021-04-13 ENCOUNTER — Encounter: Payer: Self-pay | Admitting: Family

## 2021-04-13 ENCOUNTER — Telehealth: Payer: Self-pay

## 2021-04-13 DIAGNOSIS — D509 Iron deficiency anemia, unspecified: Secondary | ICD-10-CM

## 2021-04-13 LAB — ERYTHROPOIETIN: Erythropoietin: 27.2 m[IU]/mL — ABNORMAL HIGH (ref 2.6–18.5)

## 2021-04-19 ENCOUNTER — Other Ambulatory Visit: Payer: Self-pay

## 2021-04-19 ENCOUNTER — Inpatient Hospital Stay: Payer: No Typology Code available for payment source

## 2021-04-19 VITALS — BP 100/68 | HR 82 | Temp 98.1°F | Resp 18

## 2021-04-19 DIAGNOSIS — D509 Iron deficiency anemia, unspecified: Secondary | ICD-10-CM | POA: Diagnosis not present

## 2021-04-19 MED ORDER — SODIUM CHLORIDE 0.9 % IV SOLN
125.0000 mg | Freq: Once | INTRAVENOUS | Status: AC
  Administered 2021-04-19: 125 mg via INTRAVENOUS
  Filled 2021-04-19: qty 125

## 2021-04-19 MED ORDER — SODIUM CHLORIDE 0.9 % IV SOLN
Freq: Once | INTRAVENOUS | Status: AC
  Filled 2021-04-19: qty 250

## 2021-04-19 NOTE — Patient Instructions (Signed)
Sodium Ferric Gluconate Complex injection What is this medication? SODIUM FERRIC GLUCONATE COMPLEX (SOE dee um FER ik GLOO koe nate KOM pleks) is an iron replacement. It is used with epoetin therapy to treat low iron levelsin patients who are receiving hemodialysis. This medicine may be used for other purposes; ask your health care provider orpharmacist if you have questions. COMMON BRAND NAME(S): Ferrlecit, Nulecit What should I tell my care team before I take this medication? They need to know if you have any of the following conditions: anemia that is not from iron deficiency high levels of iron in the body an unusual or allergic reaction to iron, benzyl alcohol, other medicines, foods, dyes, or preservatives pregnant or are trying to become pregnant breast-feeding How should I use this medication? This medicine is for infusion into a vein. It is given by a health careprofessional in a hospital or clinic setting. Talk to your pediatrician regarding the use of this medicine in children. While this drug may be prescribed for children as young as 6 years old for selectedconditions, precautions do apply. Overdosage: If you think you have taken too much of this medicine contact apoison control center or emergency room at once. NOTE: This medicine is only for you. Do not share this medicine with others. What if I miss a dose? It is important not to miss your dose. Call your doctor or health careprofessional if you are unable to keep an appointment. What may interact with this medication? Do not take this medicine with any of the following medications: deferoxamine dimercaprol other iron products This medicine may also interact with the following medications: chloramphenicol deferasirox medicine for blood pressure like enalapril This list may not describe all possible interactions. Give your health care provider a list of all the medicines, herbs, non-prescription drugs, or dietary  supplements you use. Also tell them if you smoke, drink alcohol, or use illegaldrugs. Some items may interact with your medicine. What should I watch for while using this medication? Your condition will be monitored carefully while you are receiving thismedicine. Visit your doctor for check-ups as directed. What side effects may I notice from receiving this medication? Side effects that you should report to your doctor or health care professionalas soon as possible: allergic reactions like skin rash, itching or hives, swelling of the face, lips, or tongue breathing problems changes in hearing changes in vision chills, flushing, or sweating fast, irregular heartbeat feeling faint or lightheaded, falls fever, flu-like symptoms high or low blood pressure pain, tingling, numbness in the hands or feet severe pain in the chest, back, flanks, or groin swelling of the ankles, feet, hands trouble passing urine or change in the amount of urine unusually weak or tired Side effects that usually do not require medical attention (report to yourdoctor or health care professional if they continue or are bothersome): cramps dark colored stools diarrhea headache nausea, vomiting stomach upset This list may not describe all possible side effects. Call your doctor for medical advice about side effects. You may report side effects to FDA at1-800-FDA-1088. Where should I keep my medication? This drug is given in a hospital or clinic and will not be stored at home. NOTE: This sheet is a summary. It may not cover all possible information. If you have questions about this medicine, talk to your doctor, pharmacist, orhealth care provider.  2022 Elsevier/Gold Standard (2008-04-30 15:58:57)  

## 2021-04-23 ENCOUNTER — Other Ambulatory Visit: Payer: Self-pay

## 2021-04-23 ENCOUNTER — Inpatient Hospital Stay: Payer: No Typology Code available for payment source

## 2021-04-23 VITALS — BP 110/83 | HR 85 | Temp 98.3°F | Resp 18

## 2021-04-23 DIAGNOSIS — D509 Iron deficiency anemia, unspecified: Secondary | ICD-10-CM

## 2021-04-23 LAB — ALPHA-THALASSEMIA GENOTYPR

## 2021-04-23 MED ORDER — SODIUM CHLORIDE 0.9% FLUSH
10.0000 mL | Freq: Once | INTRAVENOUS | Status: DC | PRN
Start: 2021-04-23 — End: 2021-04-23
  Filled 2021-04-23: qty 10

## 2021-04-23 MED ORDER — SODIUM CHLORIDE 0.9 % IV SOLN
125.0000 mg | Freq: Once | INTRAVENOUS | Status: AC
  Administered 2021-04-23: 125 mg via INTRAVENOUS
  Filled 2021-04-23: qty 125

## 2021-04-23 MED ORDER — SODIUM CHLORIDE 0.9% FLUSH
3.0000 mL | Freq: Once | INTRAVENOUS | Status: DC | PRN
Start: 2021-04-23 — End: 2021-04-23
  Filled 2021-04-23: qty 10

## 2021-04-23 MED ORDER — SODIUM CHLORIDE 0.9 % IV SOLN
Freq: Once | INTRAVENOUS | Status: AC
  Filled 2021-04-23: qty 250

## 2021-04-23 NOTE — Patient Instructions (Signed)
[facility]  Discharge Instructions: Thank you for choosing [facility] to provide your oncology and hematology care.   If you have a lab appointment with the [facility], please go directly to the [facility] and check in at the registration area.  Wear comfortable clothing and clothing appropriate for easy access to any Portacath or PICC line.   We strive to give you quality time with your provider. You may need to reschedule your appointment if you arrive late (15 or more minutes).  Arriving late affects you and other patients whose appointments are after yours.  Also, if you miss three or more appointments without notifying the office, you may be dismissed from the clinic at the provider's discretion.      For prescription refill requests, have your pharmacy contact our office and allow 72 hours for refills to be completed.    Today you received the following chemotherapy and/or immunotherapy agents ferric    To help prevent nausea and vomiting after your treatment, we encourage you to take your nausea medication as directed.  BELOW ARE SYMPTOMS THAT SHOULD BE REPORTED IMMEDIATELY: *FEVER GREATER THAN 100.4 F (38 C) OR HIGHER *CHILLS OR SWEATING *NAUSEA AND VOMITING THAT IS NOT CONTROLLED WITH YOUR NAUSEA MEDICATION *UNUSUAL SHORTNESS OF BREATH *UNUSUAL BRUISING OR BLEEDING *URINARY PROBLEMS (pain or burning when urinating, or frequent urination) *BOWEL PROBLEMS (unusual diarrhea, constipation, pain near the anus) TENDERNESS IN MOUTH AND THROAT WITH OR WITHOUT PRESENCE OF ULCERS (sore throat, sores in mouth, or a toothache) UNUSUAL RASH, SWELLING OR PAIN  UNUSUAL VAGINAL DISCHARGE OR ITCHING   Items with * indicate a potential emergency and should be followed up as soon as possible or go to the Emergency Department if any problems should occur.  Please show the CHEMOTHERAPY ALERT CARD or IMMUNOTHERAPY ALERT CARD at check-in to the  Emergency Department and triage nurse. Should you have questions after your visit or need to cancel or reschedule your appointment, please contact [facility]  [phone] and follow the prompts.  Office hours are 8:00 a.m. to 4:30 p.m. Monday - Friday. Please note that voicemails left after 4:00 p.m. may not be returned until the following business day.  We are closed weekends and major holidays. You have access to a nurse at all times for urgent questions. Please call the main number to the clinic [phone] and follow the prompts.  For any non-urgent questions, you may also contact your provider using MyChart. We now offer e-Visits for anyone 7 and older to request care online for non-urgent symptoms. For details visit [email or web address].   Also download the MyChart app! Go to the app store, search "MyChart", open the app, select [facility], and log in with your MyChart username and password.  Due to Covid, a mask is required upon entering the hospital/clinic. If you do not have a mask, one will be given to you upon arrival. For doctor visits, patients may have 1 support person aged 68 or older with them. For treatment visits, patients cannot have anyone with them due to current Covid guidelines and our immunocompromised population.

## 2021-04-27 ENCOUNTER — Other Ambulatory Visit: Payer: Self-pay | Admitting: Family

## 2021-04-27 ENCOUNTER — Telehealth: Payer: Self-pay

## 2021-04-27 DIAGNOSIS — D563 Thalassemia minor: Secondary | ICD-10-CM

## 2021-04-27 MED ORDER — FOLIC ACID 1 MG PO TABS: 1.0000 mg | ORAL_TABLET | Freq: Every day | ORAL | 11 refills | Status: DC

## 2021-04-27 NOTE — Telephone Encounter (Signed)
-----   Message from Eliezer Bottom, NP sent at 04/27/2021  8:27 AM EDT ----- Patient was positive for alpha thalassemia minor trait. This is quite common in the African American population and means she has the tendency to make smaller and less red blood cells. The treatment is daily folic acid. I sent a prescription to her pharmacy! Let me know if she has any questions or concerns. Thank you :)   ----- Message ----- From: Interface, [facility] Sent: 04/12/2021   9:02 AM EDT To: Eliezer Bottom, NP

## 2021-04-27 NOTE — Telephone Encounter (Signed)
LMOM and informed patient of lab results (ok per DPR). Request a r/c with any further concerns or questions.

## 2021-05-03 ENCOUNTER — Other Ambulatory Visit: Payer: Self-pay | Admitting: Podiatry

## 2021-05-10 ENCOUNTER — Telehealth: Payer: Self-pay | Admitting: Orthopaedic Surgery

## 2021-05-10 NOTE — Telephone Encounter (Signed)
Patient would like to get knee xray 06/03/2020 on CD. Please call patient when ready [phone]. Thanks

## 2021-05-12 NOTE — Telephone Encounter (Signed)
Advised patient that CD is ready for pick up.

## 2021-05-24 ENCOUNTER — Other Ambulatory Visit: Payer: Self-pay | Admitting: Family Medicine

## 2021-05-24 DIAGNOSIS — E559 Vitamin D deficiency, unspecified: Secondary | ICD-10-CM

## 2021-06-02 LAB — HM MAMMOGRAPHY

## 2021-06-07 ENCOUNTER — Inpatient Hospital Stay: Payer: No Typology Code available for payment source | Attending: Hematology & Oncology

## 2021-06-07 ENCOUNTER — Telehealth: Payer: Self-pay | Admitting: Family

## 2021-06-07 ENCOUNTER — Other Ambulatory Visit: Payer: Self-pay

## 2021-06-07 ENCOUNTER — Encounter: Payer: Self-pay | Admitting: Family

## 2021-06-07 ENCOUNTER — Inpatient Hospital Stay (HOSPITAL_BASED_OUTPATIENT_CLINIC_OR_DEPARTMENT_OTHER): Payer: No Typology Code available for payment source | Admitting: Family

## 2021-06-07 VITALS — BP 106/75 | HR 74 | Temp 98.3°F | Resp 18 | Wt 275.8 lb

## 2021-06-07 DIAGNOSIS — D563 Thalassemia minor: Secondary | ICD-10-CM | POA: Insufficient documentation

## 2021-06-07 DIAGNOSIS — D509 Iron deficiency anemia, unspecified: Secondary | ICD-10-CM

## 2021-06-07 DIAGNOSIS — R5383 Other fatigue: Secondary | ICD-10-CM | POA: Insufficient documentation

## 2021-06-07 LAB — CBC WITH DIFFERENTIAL (CANCER CENTER ONLY)
Abs Immature Granulocytes: 0.01 10*3/uL (ref 0.00–0.07)
Basophils Absolute: 0 10*3/uL (ref 0.0–0.1)
Basophils Relative: 1 %
Eosinophils Absolute: 0.1 10*3/uL (ref 0.0–0.5)
Eosinophils Relative: 2 %
HCT: 37.3 % (ref 36.0–46.0)
Hemoglobin: 11.1 g/dL — ABNORMAL LOW (ref 12.0–15.0)
Immature Granulocytes: 0 %
Lymphocytes Relative: 33 %
Lymphs Abs: 1.6 10*3/uL (ref 0.7–4.0)
MCH: 22.3 pg — ABNORMAL LOW (ref 26.0–34.0)
MCHC: 29.8 g/dL — ABNORMAL LOW (ref 30.0–36.0)
MCV: 74.9 fL — ABNORMAL LOW (ref 80.0–100.0)
Monocytes Absolute: 0.3 10*3/uL (ref 0.1–1.0)
Monocytes Relative: 6 %
Neutro Abs: 2.8 10*3/uL (ref 1.7–7.7)
Neutrophils Relative %: 58 %
Platelet Count: 318 10*3/uL (ref 150–400)
RBC: 4.98 MIL/uL (ref 3.87–5.11)
RDW: 20.1 % — ABNORMAL HIGH (ref 11.5–15.5)
WBC Count: 4.9 10*3/uL (ref 4.0–10.5)
nRBC: 0 % (ref 0.0–0.2)

## 2021-06-07 LAB — RETICULOCYTES
Immature Retic Fract: 22.4 % — ABNORMAL HIGH (ref 2.3–15.9)
RBC.: 4.93 MIL/uL (ref 3.87–5.11)
Retic Count, Absolute: 83.8 10*3/uL (ref 19.0–186.0)
Retic Ct Pct: 1.7 % (ref 0.4–3.1)

## 2021-06-07 NOTE — Telephone Encounter (Signed)
Scheduled appt per 9/26 los - mailed letter with appt date and time   

## 2021-06-07 NOTE — Progress Notes (Signed)
Hematology and Oncology Follow Up Visit  Samantha Holden [id number] 03/17/1971 50 y.o. 06/07/2021   Principle Diagnosis:  Iron deficiency anemia  Alpha thalassemia minor trait   Current Therapy:   IV iron as indicated  Folic acid 1 mg PO daily   Interim History:  Samantha Holden is here today for follow-up. She is doing well and notes that she feels much better since receiving IV iron.  Her energy has improved but she does have some occasional fatigue.  She only started wanting ice and cold drinks this week.  No blood loss noted. No bruising or petechiae.  No fever, chills, n/v, cough, rash, dizziness, SOB, chest pain, palpitations, abdominal pain or changes in bowel or bladder habits.  No swelling, tenderness, numbness or tingling in her extremities at this time. She has some puffiness in her ankles in the morning that typically resolves by the afternoon.  She uses a cane or walker when ambulating or a wheelchair for longer distances.  No falls or syncope to report.  She has maintained a good appetite and is staying well hydrated. Her weight is stable at 275 lbs.   ECOG Performance Status: 1 - Symptomatic but completely ambulatory  Medications:  Allergies as of 06/07/2021   No Known Allergies      Medication List        Accurate as of June 07, 2021 11:41 AM. If you have any questions, ask your nurse or doctor.          folic acid 1 MG tablet Commonly known as: FOLVITE Take 1 tablet (1 mg total) by mouth daily.   meloxicam 15 MG tablet Commonly known as: MOBIC TAKE 1 TABLET BY MOUTH EVERY DAY   Vitamin D3 1.25 MG (50000 UT) Caps TAKE 1 WEEKLY FOR 12 WEEKS        Allergies: No Known Allergies  Past Medical History, Surgical history, Social history, and Family History were reviewed and updated.  Review of Systems: All other 10 point review of systems is negative.   Physical Exam:  vitals were not taken for this visit.   Wt Readings from Last 3  Encounters:  04/12/21 271 lb 6.4 oz (123.1 kg)  03/01/21 (!) 301 lb (136.5 kg)  12/21/20 290 lb (131.5 kg)    Ocular: Sclerae unicteric, pupils equal, round and reactive to light Ear-nose-throat: Oropharynx clear, dentition fair Lymphatic: No cervical or supraclavicular adenopathy Lungs no rales or rhonchi, good excursion bilaterally Heart regular rate and rhythm, no murmur appreciated Abd soft, nontender, positive bowel sounds MSK no focal spinal tenderness, no joint edema Neuro: non-focal, well-oriented, appropriate affect Breasts: Deferred   Lab Results  Component Value Date   WBC 4.9 06/07/2021   HGB 11.1 (L) 06/07/2021   HCT 37.3 06/07/2021   MCV 74.9 (L) 06/07/2021   PLT 318 06/07/2021   Lab Results  Component Value Date   FERRITIN 127 04/12/2021   IRON 30 (L) 04/12/2021   TIBC 346 04/12/2021   UIBC 316 04/12/2021   IRONPCTSAT 9 (L) 04/12/2021   Lab Results  Component Value Date   RETICCTPCT 0.7 04/12/2021   RBC 4.98 06/07/2021   No results found for: KPAFRELGTCHN, LAMBDASER, KAPLAMBRATIO No results found for: IGGSERUM, IGA, IGMSERUM No results found for: TOTALPROTELP, ALBUMINELP, A1GS, A2GS, BETS, BETA2SER, GAMS, MSPIKE, SPEI   Chemistry      Component Value Date/Time   NA 139 04/12/2021 0858   K 4.1 04/12/2021 0858   CL 104 04/12/2021 0858   CO2  27 04/12/2021 0858   BUN 18 04/12/2021 0858   CREATININE 0.76 04/12/2021 0858      Component Value Date/Time   CALCIUM 9.8 04/12/2021 0858   ALKPHOS 85 04/12/2021 0858   AST 10 (L) 04/12/2021 0858   ALT 6 04/12/2021 0858   BILITOT 0.3 04/12/2021 0858       Impression and Plan: Samantha Holden is a very pleasant 50 yo African American female with iron deficiency anemia and alpha thalassemia minor trait.  Iron studies are pending. We will replace if needed.  She will continue her daily folic acid long term.  Follow-up in 3 months.  She can contact our office with any questions or concerns.   Lottie Dawson,  NP 9/26/202211:41 AM

## 2021-06-08 LAB — IRON AND TIBC
Iron: 45 ug/dL (ref 41–142)
Saturation Ratios: 12 % — ABNORMAL LOW (ref 21–57)
TIBC: 365 ug/dL (ref 236–444)
UIBC: 320 ug/dL (ref 120–384)

## 2021-06-08 LAB — FERRITIN: Ferritin: 111 ng/mL (ref 11–307)

## 2021-06-14 ENCOUNTER — Ambulatory Visit: Payer: No Typology Code available for payment source | Attending: Family Medicine | Admitting: Physical Therapy

## 2021-06-14 DIAGNOSIS — R2681 Unsteadiness on feet: Secondary | ICD-10-CM | POA: Insufficient documentation

## 2021-06-14 DIAGNOSIS — G8929 Other chronic pain: Secondary | ICD-10-CM | POA: Insufficient documentation

## 2021-06-14 DIAGNOSIS — M25561 Pain in right knee: Secondary | ICD-10-CM | POA: Insufficient documentation

## 2021-06-14 DIAGNOSIS — M6281 Muscle weakness (generalized): Secondary | ICD-10-CM | POA: Insufficient documentation

## 2021-06-14 DIAGNOSIS — M25562 Pain in left knee: Secondary | ICD-10-CM | POA: Insufficient documentation

## 2021-06-15 ENCOUNTER — Inpatient Hospital Stay: Payer: No Typology Code available for payment source | Attending: Hematology & Oncology

## 2021-06-15 ENCOUNTER — Other Ambulatory Visit: Payer: Self-pay

## 2021-06-15 VITALS — BP 106/78 | HR 89 | Temp 97.9°F | Resp 18

## 2021-06-15 DIAGNOSIS — D509 Iron deficiency anemia, unspecified: Secondary | ICD-10-CM | POA: Insufficient documentation

## 2021-06-15 DIAGNOSIS — D563 Thalassemia minor: Secondary | ICD-10-CM | POA: Insufficient documentation

## 2021-06-15 DIAGNOSIS — R5383 Other fatigue: Secondary | ICD-10-CM | POA: Insufficient documentation

## 2021-06-15 MED ORDER — SODIUM CHLORIDE 0.9 % IV SOLN
Freq: Once | INTRAVENOUS | Status: AC
Start: 2021-06-15 — End: 2021-06-15

## 2021-06-15 MED ORDER — SODIUM CHLORIDE 0.9 % IV SOLN
125.0000 mg | Freq: Once | INTRAVENOUS | Status: AC
  Administered 2021-06-15: 125 mg via INTRAVENOUS
  Filled 2021-06-15: qty 125

## 2021-06-15 NOTE — Patient Instructions (Signed)
Sodium Ferric Gluconate Complex Injection What is this medication? SODIUM FERRIC GLUCONATE COMPLEX (SOE dee um FER ik GLOO koe nate KOM pleks) treats low levels of iron (iron deficiency anemia) in people with kidney disease. Iron is a mineral that plays an important role in making red blood cells, which carry oxygen from your lungs to the rest of your body. This medicine may be used for other purposes; ask your health care provider or pharmacist if you have questions. COMMON BRAND NAME(S): Ferrlecit, Nulecit What should I tell my care team before I take this medication? They need to know if you have any of the following conditions: Anemia that is not from iron deficiency High levels of iron in the blood An unusual or allergic reaction to iron, other medications, foods, dyes, or preservatives Pregnant or are trying to become pregnant Breast-feeding How should I use this medication? This medication is injected into a vein. It is given by your care team in a hospital or clinic setting. Talk to your care team about the use of this medication in children. While it may be prescribed for children as young as 6 years for selected conditions, precautions do apply. Overdosage: If you think you have taken too much of this medicine contact a poison control center or emergency room at once. NOTE: This medicine is only for you. Do not share this medicine with others. What if I miss a dose? It is important not to miss your dose. Call your care team if you are unable to keep an appointment. What may interact with this medication? Do not take this medication with any of the following: Deferasirox Deferoxamine Dimercaprol This medication may also interact with the following: Other iron products This list may not describe all possible interactions. Give your health care provider a list of all the medicines, herbs, non-prescription drugs, or dietary supplements you use. Also tell them if you smoke, drink  alcohol, or use illegal drugs. Some items may interact with your medicine. What should I watch for while using this medication? Your condition will be monitored carefully while you are receiving this medication. Visit your care team for regular checks on your progress. You may need blood work while you are taking this medication. What side effects may I notice from receiving this medication? Side effects that you should report to your care team as soon as possible: Allergic reactions-skin rash, itching, hives, swelling of the face, lips, tongue, or throat Low blood pressure-dizziness, feeling faint or lightheaded, blurry vision Shortness of breath Side effects that usually do not require medical attention (report to your care team if they continue or are bothersome): Flushing Headache Joint pain Muscle pain Nausea Pain, redness, or irritation at injection site This list may not describe all possible side effects. Call your doctor for medical advice about side effects. You may report side effects to FDA at 1-800-FDA-1088. Where should I keep my medication? This medication is given in a hospital or clinic and will not be stored at home. NOTE: This sheet is a summary. It may not cover all possible information. If you have questions about this medicine, talk to your doctor, pharmacist, or health care provider.  2022 Elsevier/Gold Standard (2020-10-12 11:46:34)  

## 2021-06-22 ENCOUNTER — Inpatient Hospital Stay: Payer: No Typology Code available for payment source

## 2021-06-22 ENCOUNTER — Other Ambulatory Visit: Payer: Self-pay

## 2021-06-22 VITALS — BP 105/70 | HR 82 | Temp 97.9°F | Resp 18

## 2021-06-22 DIAGNOSIS — D509 Iron deficiency anemia, unspecified: Secondary | ICD-10-CM | POA: Diagnosis not present

## 2021-06-22 MED ORDER — SODIUM CHLORIDE 0.9 % IV SOLN
125.0000 mg | Freq: Once | INTRAVENOUS | Status: AC
  Administered 2021-06-22: 125 mg via INTRAVENOUS
  Filled 2021-06-22: qty 125

## 2021-06-22 MED ORDER — SODIUM CHLORIDE 0.9 % IV SOLN: Freq: Once | INTRAVENOUS | Status: AC

## 2021-06-22 NOTE — Patient Instructions (Signed)
Sodium Ferric Gluconate Complex Injection What is this medication? SODIUM FERRIC GLUCONATE COMPLEX (SOE dee um FER ik GLOO koe nate KOM pleks) treats low levels of iron (iron deficiency anemia) in people with kidney disease. Iron is a mineral that plays an important role in making red blood cells, which carry oxygen from your lungs to the rest of your body. This medicine may be used for other purposes; ask your health care provider or pharmacist if you have questions. COMMON BRAND NAME(S): Ferrlecit, Nulecit What should I tell my care team before I take this medication? They need to know if you have any of the following conditions: Anemia that is not from iron deficiency High levels of iron in the blood An unusual or allergic reaction to iron, other medications, foods, dyes, or preservatives Pregnant or are trying to become pregnant Breast-feeding How should I use this medication? This medication is injected into a vein. It is given by your care team in a hospital or clinic setting. Talk to your care team about the use of this medication in children. While it may be prescribed for children as young as 6 years for selected conditions, precautions do apply. Overdosage: If you think you have taken too much of this medicine contact a poison control center or emergency room at once. NOTE: This medicine is only for you. Do not share this medicine with others. What if I miss a dose? It is important not to miss your dose. Call your care team if you are unable to keep an appointment. What may interact with this medication? Do not take this medication with any of the following: Deferasirox Deferoxamine Dimercaprol This medication may also interact with the following: Other iron products This list may not describe all possible interactions. Give your health care provider a list of all the medicines, herbs, non-prescription drugs, or dietary supplements you use. Also tell them if you smoke, drink  alcohol, or use illegal drugs. Some items may interact with your medicine. What should I watch for while using this medication? Your condition will be monitored carefully while you are receiving this medication. Visit your care team for regular checks on your progress. You may need blood work while you are taking this medication. What side effects may I notice from receiving this medication? Side effects that you should report to your care team as soon as possible: Allergic reactions-skin rash, itching, hives, swelling of the face, lips, tongue, or throat Low blood pressure-dizziness, feeling faint or lightheaded, blurry vision Shortness of breath Side effects that usually do not require medical attention (report to your care team if they continue or are bothersome): Flushing Headache Joint pain Muscle pain Nausea Pain, redness, or irritation at injection site This list may not describe all possible side effects. Call your doctor for medical advice about side effects. You may report side effects to FDA at 1-800-FDA-1088. Where should I keep my medication? This medication is given in a hospital or clinic and will not be stored at home. NOTE: This sheet is a summary. It may not cover all possible information. If you have questions about this medicine, talk to your doctor, pharmacist, or health care provider.  2022 Elsevier/Gold Standard (2020-10-12 11:46:34)  

## 2021-06-29 ENCOUNTER — Other Ambulatory Visit: Payer: Self-pay

## 2021-06-29 ENCOUNTER — Ambulatory Visit: Payer: No Typology Code available for payment source

## 2021-06-29 DIAGNOSIS — M25561 Pain in right knee: Secondary | ICD-10-CM | POA: Diagnosis not present

## 2021-06-29 DIAGNOSIS — M6281 Muscle weakness (generalized): Secondary | ICD-10-CM

## 2021-06-29 DIAGNOSIS — M25562 Pain in left knee: Secondary | ICD-10-CM | POA: Diagnosis present

## 2021-06-29 DIAGNOSIS — R2681 Unsteadiness on feet: Secondary | ICD-10-CM

## 2021-06-29 DIAGNOSIS — G8929 Other chronic pain: Secondary | ICD-10-CM | POA: Diagnosis present

## 2021-06-29 NOTE — Therapy (Signed)
[place], [place], [address] Phone: [phone]   Fax:  [phone]  Physical Therapy Evaluation  Patient Details  Name: Samantha Holden MRN: [id number] Date of Birth: 06/17/1971 Referring Provider (PT): Lamar Blinks, MD   Encounter Date: 06/29/2021   PT End of Session - 06/29/21 1724     Visit Number 1    Number of Visits 9    Date for PT Re-Evaluation 08/24/21    Authorization Type U[employer]    PT Start Time 1620    PT Stop Time 1705    PT Time Calculation (min) 45 min    Activity Tolerance Patient tolerated treatment well;Patient limited by pain    Behavior During Therapy [facility] for tasks assessed/performed             Past Medical History:  Diagnosis Date   Allergy    Chicken pox     Past Surgical History:  Procedure Laterality Date   DILATION AND CURETTAGE, DIAGNOSTIC / THERAPEUTIC  1992   WISDOM TOOTH EXTRACTION      There were no vitals filed for this visit.    Subjective Assessment - 06/29/21 1622     Subjective Pt reports primary c/o BIL L>R knee pain of insidious onset starting in 2021 around Thanksgiving. She reports that it has progressed rapidly to the point where she began using a wheelchair in March; she presents in the [employer] today. She denies any changes in activity leading to the increased pain. She denies any clicking, popping, catching, or N/T. However, she does describe occasional locking after being in a seated position for a prolonged period requiring her to physically "unlock" her knee using her hands. Her primary knee pain is localized to her L anterior knee inferior and lateral to the patella. She describes a "crackly" feeling when flexing and extending BIL knees. She also describes experiencing a fall on ice in her driveway in January of this year with her L leg bending under her. Her daughter helped her back into the house. She iced and heated it until the pain subsided. She  stayed home for a week, during which she would only walk from room to room. After a week of rest, she was able to return to community ambulation and the pain returned to baseline.  Pt denies any unexplained change in weight, change in bowel or bladder function, saddle anesthesia, unrelenting night pain, or nausea/ vomiting. Current pain is 3/10, worst pain is 8/10, best pain is 3/10.    Limitations Sitting;Standing;Walking;House hold activities;Other (comment)   Stairs   How long can you sit comfortably? Unlimited    How long can you stand comfortably? 5-10 minutes    How long can you walk comfortably? 15-20 minutes with SPC or walker    Diagnostic tests 09/08/2020: MR L knee: IMPRESSION:  1. Prominent degenerative signal within the anterior horn of the  lateral meniscus with adjacent inflammatory changes anteriorly in  the intercondylar notch and probable small ganglion. No displaced  meniscal fragment identified.  2. Mild-to-moderate tricompartmental degenerative changes, greatest  in the lateral compartment. No acute osseous findings.  3. Small joint effusion and small partially ruptured Baker's cyst.  4. The medial meniscus, cruciate and collateral ligaments are  intact.                 09/08/2020: MR R Knee: IMPRESSION:  1. No acute findings or evidence of internal derangement. The  menisci, cruciate and collateral ligaments are  intact.  2. Mild tricompartmental degenerative changes. No acute osseous  findings.  3. Small to moderate joint effusion with synovial irregularity  consistent with synovitis.  4. Prominent lymph nodes posteriorly in the distal right thigh,  likely reactive. Correlate clinically to exclude lymphoproliferative  disorder. These were not documented on lower extremity Doppler  ultrasound 08/11/2020.    Patient Stated Goals "walk straighter", walk up and down stairs    Currently in Pain? Yes    Pain Score 3     Pain Location Knee    Pain Orientation Right;Left    Pain Descriptors  / Indicators Aching    Pain Type Chronic pain    Pain Onset More than a month ago    Pain Frequency Intermittent    Aggravating Factors  walking and standing    Pain Relieving Factors ice, heat, pain medication    Effect of Pain on Daily Activities See patient goals                [facility] PT Assessment - 06/29/21 0001       Assessment   Medical Diagnosis Pain in right knee (M25.561)    Referring Provider (PT) Lamar Blinks, MD    Onset Date/Surgical Date 06/29/20    Hand Dominance Right    Next MD Visit Next month    Prior Therapy Yes, for knees      Precautions   Precautions None      Restrictions   Weight Bearing Restrictions No      Balance Screen   Has the patient fallen in the past 6 months No    Has the patient had a decrease in activity level because of a fear of falling?  No    Is the patient reluctant to leave their home because of a fear of falling?  No      Home Environment   Living Environment Private residence    Living Arrangements Children    Available Help at Discharge Family;Friend(s)    Type of [place] Access Level entry    Home Layout One level    [place] - 2 wheels;Cane - single point;Wheelchair - manual      Prior Function   Level of Independence Independent    Vocation Full time employment    Vocation Requirements HR for [employer]    Leisure Shop, walking      Cognition   Overall Cognitive Status Within Functional Limits for tasks assessed      Observation/Other Assessments   Focus on Therapeutic Outcomes (FOTO)  38%, projected 58% in 14 visits      Functional Tests   Functional tests Squat      Squat   Comments Unable to perform      AROM   Right Knee Extension 8    Right Knee Flexion 120    Left Knee Extension 20    Left Knee Flexion 105      PROM   Right Knee Extension 6   pain   Right Knee Flexion 122   severe pain   Left Knee Extension 18   severe pain   Left Knee Flexion 108 severe  pain      Strength   Right Hip Flexion 5/5    Right Hip ABduction 3/5    Right Hip ADduction 3/5    Left Hip Flexion 5/5    Left Hip ABduction 3/5    Left Hip ADduction 3/5  Right Knee Flexion 5/5    Right Knee Extension 5/5    Left Knee Flexion 5/5    Left Knee Extension 4/5      Flexibility   Hamstrings Hamstring 90/90 (+) BIL, 30 degrees shy of full extension on L, 23 degrees shy on R      Palpation   Patella mobility hypomobile in all planes on R with palpable crepitus; hypomobile on L, possibly due to quadriceps muscle guarding    Palpation comment TTP to L medial and lateral joint line on L, TTP to lateral patellofemoral ligament on R      Lateral Pull Sign    Findings Positive    Side Right      Patellofemoral Apprehension Test    Findings Positive    Side  Right;Left      Patellofemoral Grind test (Clark's Sign)   Findings Postive    Side  Right      McMurray Test   Findings Positive    Side Left      Apley's Test   Findings Positive    Side Left      other   Comments Thessaly at 0/ 20 degrees of knee flexion      Transfers   Five time sit to stand comments  21 seconds with UE propulsion; unable to perform without use of hands                        Objective measurements completed on examination: See above findings.                PT Education - 06/29/21 1723     Education Details Educated on potential underlying pathophysiology behind the pt's pain presentation, POC, and HEP    Person(s) Educated Patient    Methods Explanation;Demonstration;Handout    Comprehension Verbalized understanding;Returned demonstration              PT Short Term Goals - 06/29/21 1724       PT SHORT TERM GOAL #1   Title Pt will report understanding and adherence to her HEP in order to promote independence in the management of her primary impairments.    Baseline HEP provided at eval    Time 4    Period Weeks    Status New     Target Date 07/27/21               PT Long Term Goals - 06/29/21 1818       PT LONG TERM GOAL #1   Title Pt will achieve a FOTO score of 58% in order to demonstrate improved functional ability as it relates to her knee pain.    Baseline 38%    Time 8    Period Weeks    Status New    Target Date 08/24/21      PT LONG TERM GOAL #2   Title Pt will report ability to ambulate >30 minutes with AD in order to grocery shop without limitation.    Baseline Pt unable to walk >15-20 minutes due to pain.    Time 8    Period Weeks    Status New    Target Date 08/24/21      PT LONG TERM GOAL #3   Title Pt will achieve 5 full-depth squat with 0-3/10 pain in order to pick up groceries from the floor.    Baseline Pt unable to attempt squat due to fear/ pain.  Time 8    Period Weeks    Status New    Target Date 08/24/21      PT LONG TERM GOAL #4   Title Pt will achieve 5xSTS of 15 seconds or less in order to demonstrate balance for safe community ambulation.    Baseline 21 seconds    Time 8    Period Weeks    Status New    Target Date 08/24/21      PT LONG TERM GOAL #5   Title Pt will achieve global hip/ knee strength of 4+/5 or greater in order to progress independent LE strengthening regimen without limitation.    Baseline See flowsheet    Time 8    Period Weeks    Status New    Target Date 08/24/21                    Plan - 06/29/21 1727     Clinical Impression Statement Pt is a pleasant 50yo F who presents with primary c/o chronic BIL L>R knee pain lasting about 1 year. Upon assessment, her primary impairments include pain with BIL end range knee flexion and extension PROM with overpressure L>R, TTP to L medial and lateral joint line, TTP to R lateral patellofemoral ligament, BIL hypomobile patellar passive accessories with pain on the R, inability to perform a squat, limited 5xSTS, limited hamstring extensibility BIL L>R, L knee extension weakness, and BIL hip  extension and abduction weakness. Ruling up L knee meniscal pathology due to report of L knee locking, joint line tenderness, and positive Apley's and McMurray's special testing. Ruling up R knee patellofemoral pain syndrome (PFPS) due to crepitus and pain with patellar passive accessories, pain with stairs, and positive special testing for PFPS. Cannot rule out lateral patellofemoral ligament pathology due to tenderness over this ligament. Pt will benefit from skilled PT to address her primary impairments and return to her prior level of function with less limitation due to pain.    Personal Factors and Comorbidities Fitness;Time since onset of injury/illness/exacerbation    Examination-Activity Limitations Sit;Squat;Stairs;Stand;Lift;Locomotion Level;Carry;Bend;Transfers    Examination-Participation Restrictions Shop;Occupation;Community Activity    Stability/Clinical Decision Making Evolving/Moderate complexity    Clinical Decision Making Moderate    Rehab Potential Good    PT Frequency 2x / week    PT Duration 8 weeks    PT Treatment/Interventions ADLs/Self Care Home Management;Cryotherapy;Moist Heat;Gait training;Stair training;Functional mobility training;Therapeutic activities;Therapeutic exercise;Neuromuscular re-education;Patient/family education;Manual techniques;Passive range of motion;Dry needling;Taping;Aquatic Therapy;Vasopneumatic Device;Compression bandaging;Balance training    PT Next Visit Plan Progress hip/ knee closed-chain strengthening, assess response to HEP    PT Home Exercise Plan [id number]    Recommended Other Services Aquatic Therapy    Consulted and Agree with Plan of Care Patient             Patient will benefit from skilled therapeutic intervention in order to improve the following deficits and impairments:  Abnormal gait, Decreased activity tolerance, Decreased balance, Decreased endurance, Decreased mobility, Decreased range of motion, Decreased strength,  Difficulty walking, Pain, Impaired flexibility, Hypomobility  Visit Diagnosis: Chronic pain of right knee  Chronic pain of left knee  Muscle weakness (generalized)  Unsteadiness on feet     Problem List Patient Active Problem List   Diagnosis Date Noted   IDA (iron deficiency anemia) 04/13/2021   Vitamin D deficiency 03/02/2021   Symptomatic mammary hypertrophy 04/28/2020   Shoulder pain 04/28/2020   Back pain 04/28/2020   Impingement syndrome of left ankle 12/30/2019  Prediabetes 12/23/2019   Right ankle pain 02/16/2018    Vanessa Dickinson, PT, DPT 06/29/21 6:23 PM   [facility] [address] [place], [place], [address] Phone: [phone]   Fax:  [phone]  Name: Samantha Holden MRN: [id number] Date of Birth: March 16, 1971

## 2021-06-29 NOTE — Patient Instructions (Signed)
  [id number]

## 2021-07-03 ENCOUNTER — Other Ambulatory Visit: Payer: Self-pay | Admitting: Podiatry

## 2021-07-06 ENCOUNTER — Ambulatory Visit: Payer: No Typology Code available for payment source

## 2021-07-06 ENCOUNTER — Other Ambulatory Visit: Payer: Self-pay

## 2021-07-06 DIAGNOSIS — G8929 Other chronic pain: Secondary | ICD-10-CM

## 2021-07-06 DIAGNOSIS — M25562 Pain in left knee: Secondary | ICD-10-CM

## 2021-07-06 DIAGNOSIS — R2681 Unsteadiness on feet: Secondary | ICD-10-CM

## 2021-07-06 DIAGNOSIS — M25561 Pain in right knee: Secondary | ICD-10-CM | POA: Diagnosis not present

## 2021-07-06 DIAGNOSIS — M6281 Muscle weakness (generalized): Secondary | ICD-10-CM

## 2021-07-06 NOTE — Therapy (Signed)
[place], [place], [address] Phone: [phone]   Fax:  [phone]  Physical Therapy Treatment  Patient Details  Name: Samantha Holden MRN: [id number] Date of Birth: July 21, 1971 Referring Provider (PT): Lamar Blinks, MD   Encounter Date: 07/06/2021   PT End of Session - 07/06/21 1718     Visit Number 2    Number of Visits 9    Date for PT Re-Evaluation 08/24/21    Authorization Type UHC    PT Start Time 1700    PT Stop Time 1745    PT Time Calculation (min) 45 min    Activity Tolerance Patient tolerated treatment well    Behavior During Therapy [facility] for tasks assessed/performed             Past Medical History:  Diagnosis Date   Allergy    Chicken pox     Past Surgical History:  Procedure Laterality Date   DILATION AND CURETTAGE, DIAGNOSTIC / THERAPEUTIC  1992   WISDOM TOOTH EXTRACTION      There were no vitals filed for this visit.   Subjective Assessment - 07/06/21 1658     Subjective Pt reports that things have been going well since her eval, adding that she has been doing her home exercises at work and at home. She states her knees feel better and that exercises are helping a lot.    Currently in Pain? Yes    Pain Score 2     Pain Location Knee    Pain Orientation Left    Pain Descriptors / Indicators Aching    Pain Type Chronic pain                   [facility] Adult PT Treatment/Exercise:   Therapeutic Exercise: -Seated knee extension with yellow theraband 2x10 with 3-sec hold -Supine bridge with clam with yellow theraband 3x10 -Sidelying hip abduction 2x10 BIL -Lateral step-up's on 6" step 2x10 BIL -Prone hamstring curls with 3# ankle weights 3x8 BIL   Manual Therapy: -n/a   Neuromuscular re-ed: - n/a   Therapeutic Activity: - n/a   Modalities: - n/a                     PT Education - 07/06/21 1717     Education Details Updated HEP     Person(s) Educated Patient    Methods Explanation;Demonstration;Handout    Comprehension Verbalized understanding;Returned demonstration              PT Short Term Goals - 06/29/21 1724       PT SHORT TERM GOAL #1   Title Pt will report understanding and adherence to her HEP in order to promote independence in the management of her primary impairments.    Baseline HEP provided at eval    Time 4    Period Weeks    Status New    Target Date 07/27/21               PT Long Term Goals - 06/29/21 1818       PT LONG TERM GOAL #1   Title Pt will achieve a FOTO score of 58% in order to demonstrate improved functional ability as it relates to her knee pain.    Baseline 38%    Time 8    Period Weeks    Status New    Target Date 08/24/21      PT LONG TERM GOAL #2  Title Pt will report ability to ambulate >30 minutes with AD in order to grocery shop without limitation.    Baseline Pt unable to walk >15-20 minutes due to pain.    Time 8    Period Weeks    Status New    Target Date 08/24/21      PT LONG TERM GOAL #3   Title Pt will achieve 5 full-depth squat with 0-3/10 pain in order to pick up groceries from the floor.    Baseline Pt unable to attempt squat due to fear/ pain.    Time 8    Period Weeks    Status New    Target Date 08/24/21      PT LONG TERM GOAL #4   Title Pt will achieve 5xSTS of 15 seconds or less in order to demonstrate balance for safe community ambulation.    Baseline 21 seconds    Time 8    Period Weeks    Status New    Target Date 08/24/21      PT LONG TERM GOAL #5   Title Pt will achieve global hip/ knee strength of 4+/5 or greater in order to progress independent LE strengthening regimen without limitation.    Baseline See flowsheet    Time 8    Period Weeks    Status New    Target Date 08/24/21                   Plan - 07/06/21 1720     Clinical Impression Statement Pt responded well to all interventions today,  demonstrating proper form and no increase in pain with selected exercises. She demonstrates improved baseline pain levels since her eval, as well as fair functional hip and quadriceps strength as indicated by her ability to perform selected exercises today without issue. She will continue to benefit from skilled PT to address her primary impairments and return to her prior level of function without limitation.    Personal Factors and Comorbidities Fitness;Time since onset of injury/illness/exacerbation    Examination-Activity Limitations Sit;Squat;Stairs;Stand;Lift;Locomotion Level;Carry;Bend;Transfers    Examination-Participation Restrictions Shop;Occupation;Community Activity    Stability/Clinical Decision Making Evolving/Moderate complexity    Clinical Decision Making Moderate    Rehab Potential Good    PT Frequency 2x / week    PT Duration 8 weeks    PT Treatment/Interventions ADLs/Self Care Home Management;Cryotherapy;Moist Heat;Gait training;Stair training;Functional mobility training;Therapeutic activities;Therapeutic exercise;Neuromuscular re-education;Patient/family education;Manual techniques;Passive range of motion;Dry needling;Taping;Aquatic Therapy;Vasopneumatic Device;Compression bandaging;Balance training    PT Next Visit Plan Progress hip/ knee closed-chain strengthening, assess response to HEP    PT Home Exercise Plan [id number]    Recommended Other Services Aquatic therapy    Consulted and Agree with Plan of Care Patient             Patient will benefit from skilled therapeutic intervention in order to improve the following deficits and impairments:  Abnormal gait, Decreased activity tolerance, Decreased balance, Decreased endurance, Decreased mobility, Decreased range of motion, Decreased strength, Difficulty walking, Pain, Impaired flexibility, Hypomobility  Visit Diagnosis: Chronic pain of right knee  Chronic pain of left knee  Muscle weakness  (generalized)  Unsteadiness on feet     Problem List Patient Active Problem List   Diagnosis Date Noted   IDA (iron deficiency anemia) 04/13/2021   Vitamin D deficiency 03/02/2021   Symptomatic mammary hypertrophy 04/28/2020   Shoulder pain 04/28/2020   Back pain 04/28/2020   Impingement syndrome of left ankle 12/30/2019   Prediabetes 12/23/2019  Right ankle pain 02/16/2018    Vanessa Montgomery, PT, DPT 07/06/21 5:44 PM   [place] [facility] [address] [place], [place], [address] Phone: [phone]   Fax:  [phone]  Name: Samantha Holden MRN: [id number] Date of Birth: [birth year]

## 2021-07-06 NOTE — Patient Instructions (Signed)
  [id number]

## 2021-07-21 ENCOUNTER — Other Ambulatory Visit: Payer: Self-pay

## 2021-07-21 ENCOUNTER — Ambulatory Visit: Payer: No Typology Code available for payment source | Attending: Family Medicine | Admitting: Physical Therapy

## 2021-07-21 ENCOUNTER — Encounter: Payer: Self-pay | Admitting: Physical Therapy

## 2021-07-21 DIAGNOSIS — G8929 Other chronic pain: Secondary | ICD-10-CM | POA: Diagnosis present

## 2021-07-21 DIAGNOSIS — R2681 Unsteadiness on feet: Secondary | ICD-10-CM | POA: Insufficient documentation

## 2021-07-21 DIAGNOSIS — M25562 Pain in left knee: Secondary | ICD-10-CM | POA: Diagnosis present

## 2021-07-21 DIAGNOSIS — M6281 Muscle weakness (generalized): Secondary | ICD-10-CM | POA: Diagnosis present

## 2021-07-21 DIAGNOSIS — M25561 Pain in right knee: Secondary | ICD-10-CM | POA: Diagnosis present

## 2021-07-21 NOTE — Therapy (Signed)
[place], [place], [address] Phone: [phone]   Fax:  [phone]  Physical Therapy Treatment  Patient Details  Name: Samantha Holden MRN: [id number] Date of Birth: 05-03-1971 Referring Provider (PT): Lamar Blinks, MD   Encounter Date: 07/21/2021   PT End of Session - 07/21/21 1748     Visit Number 3    Number of Visits 9    Date for PT Re-Evaluation 08/24/21    Authorization Type UHC    PT Start Time 0545    PT Stop Time 0630    PT Time Calculation (min) 45 min    Activity Tolerance Patient tolerated treatment well    Behavior During Therapy [facility] for tasks assessed/performed             Past Medical History:  Diagnosis Date   Allergy    Chicken pox     Past Surgical History:  Procedure Laterality Date   DILATION AND CURETTAGE, DIAGNOSTIC / THERAPEUTIC  1992   WISDOM TOOTH EXTRACTION      There were no vitals filed for this visit.   Subjective Assessment - 07/21/21 1755     Subjective Pt reports that she continues to see improvement in her knees.  She currently has 2/10, 0/10 R knee pain             [facility] Adult PT Treatment/Exercise:   Therapeutic Exercise: -Seated hip flexion -> knee extension 7.5# - 3x10 ea - STS from 64 cm - 5x5 - lateral walking at counter - 5 laps  In //:  - hurdle walking 4 hurdles - 2x fwd, 2x lat - step up 4'' step - 10x ea    PT Short Term Goals - 06/29/21 1724       PT SHORT TERM GOAL #1   Title Pt will report understanding and adherence to her HEP in order to promote independence in the management of her primary impairments.    Baseline HEP provided at eval    Time 4    Period Weeks    Status New    Target Date 07/27/21               PT Long Term Goals - 06/29/21 1818       PT LONG TERM GOAL #1   Title Pt will achieve a FOTO score of 58% in order to demonstrate improved functional ability as it relates to her knee pain.     Baseline 38%    Time 8    Period Weeks    Status New    Target Date 08/24/21      PT LONG TERM GOAL #2   Title Pt will report ability to ambulate >30 minutes with AD in order to grocery shop without limitation.    Baseline Pt unable to walk >15-20 minutes due to pain.    Time 8    Period Weeks    Status New    Target Date 08/24/21      PT LONG TERM GOAL #3   Title Pt will achieve 5 full-depth squat with 0-3/10 pain in order to pick up groceries from the floor.    Baseline Pt unable to attempt squat due to fear/ pain.    Time 8    Period Weeks    Status New    Target Date 08/24/21      PT LONG TERM GOAL #4   Title Pt will achieve 5xSTS of 15 seconds or  less in order to demonstrate balance for safe community ambulation.    Baseline 21 seconds    Time 8    Period Weeks    Status New    Target Date 08/24/21      PT LONG TERM GOAL #5   Title Pt will achieve global hip/ knee strength of 4+/5 or greater in order to progress independent LE strengthening regimen without limitation.    Baseline See flowsheet    Time 8    Period Weeks    Status New    Target Date 08/24/21                   Plan - 07/21/21 1825     Clinical Impression Statement Overall, Samantha Holden is progressing well with therapy.  Pt reports no increase in baseline pain following therapy.  Today we concentrated on functional strengthening and increasing activity tolerance.  Pt tolerates session well.  She is interested in AT.  Encouraged global ext throughout to good effect.  Will continue to progress standing exercise as able.  Pt will continue to benefit from skilled physical therapy to address remaining deficits and achieve listed goals.  Continue per POC.    Personal Factors and Comorbidities Fitness;Time since onset of injury/illness/exacerbation    Examination-Activity Limitations Sit;Squat;Stairs;Stand;Lift;Locomotion Level;Carry;Bend;Transfers    Examination-Participation Restrictions  Shop;Occupation;Community Activity    Stability/Clinical Decision Making Evolving/Moderate complexity    Rehab Potential Good    PT Frequency 2x / week    PT Duration 8 weeks    PT Treatment/Interventions ADLs/Self Care Home Management;Cryotherapy;Moist Heat;Gait training;Stair training;Functional mobility training;Therapeutic activities;Therapeutic exercise;Neuromuscular re-education;Patient/family education;Manual techniques;Passive range of motion;Dry needling;Taping;Aquatic Therapy;Vasopneumatic Device;Compression bandaging;Balance training    PT Next Visit Plan Progress hip/ knee closed-chain strengthening, assess response to HEP    PT Home Exercise Plan [id number]    Consulted and Agree with Plan of Care Patient             Patient will benefit from skilled therapeutic intervention in order to improve the following deficits and impairments:  Abnormal gait, Decreased activity tolerance, Decreased balance, Decreased endurance, Decreased mobility, Decreased range of motion, Decreased strength, Difficulty walking, Pain, Impaired flexibility, Hypomobility  Visit Diagnosis: Chronic pain of right knee  Chronic pain of left knee  Muscle weakness (generalized)  Unsteadiness on feet     Problem List Patient Active Problem List   Diagnosis Date Noted   IDA (iron deficiency anemia) 04/13/2021   Vitamin D deficiency 03/02/2021   Symptomatic mammary hypertrophy 04/28/2020   Shoulder pain 04/28/2020   Back pain 04/28/2020   Impingement syndrome of left ankle 12/30/2019   Prediabetes 12/23/2019   Right ankle pain 02/16/2018    Mathis Dad, PT 07/21/2021, 6:28 PM  [place] [facility] [address] [place], [place], [address] Phone: [phone]   Fax:  [phone]  Name: Samantha Holden MRN: [id number] Date of Birth: 28-Jul-1971

## 2021-07-28 ENCOUNTER — Other Ambulatory Visit: Payer: Self-pay

## 2021-07-28 ENCOUNTER — Ambulatory Visit: Payer: No Typology Code available for payment source | Admitting: Physical Therapy

## 2021-07-28 ENCOUNTER — Encounter: Payer: Self-pay | Admitting: Physical Therapy

## 2021-07-28 DIAGNOSIS — M6281 Muscle weakness (generalized): Secondary | ICD-10-CM

## 2021-07-28 DIAGNOSIS — M25562 Pain in left knee: Secondary | ICD-10-CM

## 2021-07-28 DIAGNOSIS — R2681 Unsteadiness on feet: Secondary | ICD-10-CM

## 2021-07-28 DIAGNOSIS — M25561 Pain in right knee: Secondary | ICD-10-CM | POA: Diagnosis not present

## 2021-07-28 DIAGNOSIS — G8929 Other chronic pain: Secondary | ICD-10-CM

## 2021-07-28 NOTE — Therapy (Signed)
[place], [place], [address] Phone: [phone]   Fax:  [phone]  Physical Therapy Treatment  Patient Details  Name: Samantha Holden MRN: [id number] Date of Birth: 02-14-1971 Referring Provider (PT): Lamar Blinks, MD   Encounter Date: 07/28/2021   PT End of Session - 07/28/21 1703     Visit Number 4    Number of Visits 9    Date for PT Re-Evaluation 08/24/21    Authorization Type UHC    PT Start Time 1702    PT Stop Time 1745    PT Time Calculation (min) 43 min    Activity Tolerance Patient tolerated treatment well    Behavior During Therapy [facility] for tasks assessed/performed             Past Medical History:  Diagnosis Date   Allergy    Chicken pox     Past Surgical History:  Procedure Laterality Date   DILATION AND CURETTAGE, DIAGNOSTIC / THERAPEUTIC  1992   WISDOM TOOTH EXTRACTION      There were no vitals filed for this visit.   Subjective Assessment - 07/28/21 1710     Subjective Pt reports that she continues to improve.  She currently has 0/10, 0/10 R knee pain              [facility] Adult PT Treatment/Exercise:   Therapeutic Exercise: -Seated hip flexion -> knee extension 7.5# - 3x10 ea - STS from 59 cm - 5x5 - lateral walking at counter - 5 laps (not today) - walking for endurance - 180' - SPC   In //:  - hurdle walking 4 hurdles - 3x fwd, 3x lat - step up 6'' step - x10 ea fwd and lat     PT Short Term Goals - 06/29/21 1724       PT SHORT TERM GOAL #1   Title Pt will report understanding and adherence to her HEP in order to promote independence in the management of her primary impairments.    Baseline HEP provided at eval    Time 4    Period Weeks    Status New    Target Date 07/27/21               PT Long Term Goals - 06/29/21 1818       PT LONG TERM GOAL #1   Title Pt will achieve a FOTO score of 58% in order to demonstrate improved  functional ability as it relates to her knee pain.    Baseline 38%    Time 8    Period Weeks    Status New    Target Date 08/24/21      PT LONG TERM GOAL #2   Title Pt will report ability to ambulate >30 minutes with AD in order to grocery shop without limitation.    Baseline Pt unable to walk >15-20 minutes due to pain.    Time 8    Period Weeks    Status New    Target Date 08/24/21      PT LONG TERM GOAL #3   Title Pt will achieve 5 full-depth squat with 0-3/10 pain in order to pick up groceries from the floor.    Baseline Pt unable to attempt squat due to fear/ pain.    Time 8    Period Weeks    Status New    Target Date 08/24/21      PT LONG TERM GOAL #  4   Title Pt will achieve 5xSTS of 15 seconds or less in order to demonstrate balance for safe community ambulation.    Baseline 21 seconds    Time 8    Period Weeks    Status New    Target Date 08/24/21      PT LONG TERM GOAL #5   Title Pt will achieve global hip/ knee strength of 4+/5 or greater in order to progress independent LE strengthening regimen without limitation.    Baseline See flowsheet    Time 8    Period Weeks    Status New    Target Date 08/24/21                   Plan - 07/28/21 1738     Clinical Impression Statement Overall, Kesleigh is progressing well with therapy.  Pt reports no increase in baseline pain following therapy.  Today we concentrated on lower extremity strengthening, functional strengthening, and normalizing gait.  Pt shows improved standing tolerance today.  She shows ability to perform sit it stand from lower surface today, but does show glute compensation.  We will continue to progress quad strengthening.  Pt will continue to benefit from skilled physical therapy to address remaining deficits and achieve listed goals.  Continue per POC.    Personal Factors and Comorbidities Fitness;Time since onset of injury/illness/exacerbation    Examination-Activity Limitations  Sit;Squat;Stairs;Stand;Lift;Locomotion Level;Carry;Bend;Transfers    Examination-Participation Restrictions Shop;Occupation;Community Activity    Stability/Clinical Decision Making Evolving/Moderate complexity    Rehab Potential Good    PT Frequency 2x / week    PT Duration 8 weeks    PT Treatment/Interventions ADLs/Self Care Home Management;Cryotherapy;Moist Heat;Gait training;Stair training;Functional mobility training;Therapeutic activities;Therapeutic exercise;Neuromuscular re-education;Patient/family education;Manual techniques;Passive range of motion;Dry needling;Taping;Aquatic Therapy;Vasopneumatic Device;Compression bandaging;Balance training    PT Next Visit Plan Progress hip/ knee closed-chain strengthening, assess response to HEP    PT Home Exercise Plan [id number]    Consulted and Agree with Plan of Care Patient             Patient will benefit from skilled therapeutic intervention in order to improve the following deficits and impairments:  Abnormal gait, Decreased activity tolerance, Decreased balance, Decreased endurance, Decreased mobility, Decreased range of motion, Decreased strength, Difficulty walking, Pain, Impaired flexibility, Hypomobility  Visit Diagnosis: Chronic pain of right knee  Chronic pain of left knee  Muscle weakness (generalized)  Unsteadiness on feet     Problem List Patient Active Problem List   Diagnosis Date Noted   IDA (iron deficiency anemia) 04/13/2021   Vitamin D deficiency 03/02/2021   Symptomatic mammary hypertrophy 04/28/2020   Shoulder pain 04/28/2020   Back pain 04/28/2020   Impingement syndrome of left ankle 12/30/2019   Prediabetes 12/23/2019   Right ankle pain 02/16/2018    Mathis Dad, PT 07/28/2021, 5:52 PM  [facility] [facility] [address] [place], [place], [address] Phone: [phone]   Fax:  [phone]  Name: Samantha Holden MRN: [id number] Date of Birth:  1971-05-20

## 2021-08-04 ENCOUNTER — Ambulatory Visit: Payer: No Typology Code available for payment source | Admitting: Physical Therapy

## 2021-08-04 ENCOUNTER — Other Ambulatory Visit: Payer: Self-pay

## 2021-08-04 ENCOUNTER — Encounter: Payer: Self-pay | Admitting: Physical Therapy

## 2021-08-04 DIAGNOSIS — M25562 Pain in left knee: Secondary | ICD-10-CM

## 2021-08-04 DIAGNOSIS — R2681 Unsteadiness on feet: Secondary | ICD-10-CM

## 2021-08-04 DIAGNOSIS — M25561 Pain in right knee: Secondary | ICD-10-CM | POA: Diagnosis not present

## 2021-08-04 DIAGNOSIS — M6281 Muscle weakness (generalized): Secondary | ICD-10-CM

## 2021-08-04 DIAGNOSIS — G8929 Other chronic pain: Secondary | ICD-10-CM

## 2021-08-04 NOTE — Therapy (Signed)
[place], [place], [address] Phone: [phone]   Fax:  [phone]  Physical Therapy Treatment  Patient Details  Name: MAZEL VILLELA MRN: [id number] Date of Birth: 1971/06/09 Referring Provider (PT): Lamar Blinks, MD   Encounter Date: 08/04/2021   PT End of Session - 08/04/21 1704     Visit Number 5    Number of Visits 9    Date for PT Re-Evaluation 08/24/21    Authorization Type UHC    PT Start Time 7680    PT Stop Time 1742    PT Time Calculation (min) 38 min    Activity Tolerance Patient tolerated treatment well    Behavior During Therapy [facility] for tasks assessed/performed             Past Medical History:  Diagnosis Date   Allergy    Chicken pox     Past Surgical History:  Procedure Laterality Date   DILATION AND CURETTAGE, DIAGNOSTIC / THERAPEUTIC  1992   WISDOM TOOTH EXTRACTION      There were no vitals filed for this visit.   Subjective Assessment - 08/04/21 1710     Subjective Pt reports that she is feeling somewhat better.  She is trying to walk more.  She currently has 0/10 R knee pain              [facility] Adult PT Treatment/Exercise:   Therapeutic Exercise: -bike L2 75m while taking subjective and planning session with patient -Seated hip flexion -> knee extension 10# - 3x10 ea - STS from 55 cm - 5x - lateral walking at counter - 5 laps (not today) - walking for endurance - 180' - SPC   In //:  - hurdle walking 4 hurdles - 3x fwd, 3x lat  (2#) - step up 6'' step - x10 ea fwd and lat (2#)     PT Short Term Goals - 06/29/21 1724       PT SHORT TERM GOAL #1   Title Pt will report understanding and adherence to her HEP in order to promote independence in the management of her primary impairments.    Baseline HEP provided at eval    Time 4    Period Weeks    Status New    Target Date 07/27/21               PT Long Term Goals - 06/29/21 1818        PT LONG TERM GOAL #1   Title Pt will achieve a FOTO score of 58% in order to demonstrate improved functional ability as it relates to her knee pain.    Baseline 38%    Time 8    Period Weeks    Status New    Target Date 08/24/21      PT LONG TERM GOAL #2   Title Pt will report ability to ambulate >30 minutes with AD in order to grocery shop without limitation.    Baseline Pt unable to walk >15-20 minutes due to pain.    Time 8    Period Weeks    Status New    Target Date 08/24/21      PT LONG TERM GOAL #3   Title Pt will achieve 5 full-depth squat with 0-3/10 pain in order to pick up groceries from the floor.    Baseline Pt unable to attempt squat due to fear/ pain.    Time 8    Period  Weeks    Status New    Target Date 08/24/21      PT LONG TERM GOAL #4   Title Pt will achieve 5xSTS of 15 seconds or less in order to demonstrate balance for safe community ambulation.    Baseline 21 seconds    Time 8    Period Weeks    Status New    Target Date 08/24/21      PT LONG TERM GOAL #5   Title Pt will achieve global hip/ knee strength of 4+/5 or greater in order to progress independent LE strengthening regimen without limitation.    Baseline See flowsheet    Time 8    Period Weeks    Status New    Target Date 08/24/21                   Plan - 08/04/21 1737     Clinical Impression Statement Overall, Tanesha is progressing well with therapy.  Pt reports no increase in baseline pain following therapy.  Today we concentrated on lower extremity strengthening, knee strengthening, and increasing activity tolerance.  Pt continues to show progression with strength and standing tolerance (with reduced need for rest between standing exercises) with increased load and reduced pain.  Pt will continue to benefit from skilled physical therapy to address remaining deficits and achieve listed goals.  Continue per POC.    Personal Factors and Comorbidities Fitness;Time since onset of  injury/illness/exacerbation    Examination-Activity Limitations Sit;Squat;Stairs;Stand;Lift;Locomotion Level;Carry;Bend;Transfers    Examination-Participation Restrictions Shop;Occupation;Community Activity    Stability/Clinical Decision Making Evolving/Moderate complexity    Rehab Potential Good    PT Frequency 2x / week    PT Duration 8 weeks    PT Treatment/Interventions ADLs/Self Care Home Management;Cryotherapy;Moist Heat;Gait training;Stair training;Functional mobility training;Therapeutic activities;Therapeutic exercise;Neuromuscular re-education;Patient/family education;Manual techniques;Passive range of motion;Dry needling;Taping;Aquatic Therapy;Vasopneumatic Device;Compression bandaging;Balance training    PT Next Visit Plan Progress hip/ knee closed-chain strengthening, assess response to HEP    PT Home Exercise Plan [id number]    Consulted and Agree with Plan of Care Patient             Patient will benefit from skilled therapeutic intervention in order to improve the following deficits and impairments:  Abnormal gait, Decreased activity tolerance, Decreased balance, Decreased endurance, Decreased mobility, Decreased range of motion, Decreased strength, Difficulty walking, Pain, Impaired flexibility, Hypomobility  Visit Diagnosis: Chronic pain of right knee  Chronic pain of left knee  Muscle weakness (generalized)  Unsteadiness on feet     Problem List Patient Active Problem List   Diagnosis Date Noted   IDA (iron deficiency anemia) 04/13/2021   Vitamin D deficiency 03/02/2021   Symptomatic mammary hypertrophy 04/28/2020   Shoulder pain 04/28/2020   Back pain 04/28/2020   Impingement syndrome of left ankle 12/30/2019   Prediabetes 12/23/2019   Right ankle pain 02/16/2018    Mathis Dad, PT 08/04/2021, 5:37 PM  [facility] [address] [place], [place], [address] Phone: [phone]   Fax:   [phone]  Name: JOCELIN SCHUELKE MRN: [id number] Date of Birth: [birth year]

## 2021-08-11 ENCOUNTER — Ambulatory Visit: Payer: No Typology Code available for payment source | Admitting: Physical Therapy

## 2021-08-11 ENCOUNTER — Encounter: Payer: Self-pay | Admitting: Physical Therapy

## 2021-08-11 ENCOUNTER — Other Ambulatory Visit: Payer: Self-pay

## 2021-08-11 DIAGNOSIS — M6281 Muscle weakness (generalized): Secondary | ICD-10-CM

## 2021-08-11 DIAGNOSIS — M25561 Pain in right knee: Secondary | ICD-10-CM | POA: Diagnosis not present

## 2021-08-11 DIAGNOSIS — R2681 Unsteadiness on feet: Secondary | ICD-10-CM

## 2021-08-11 DIAGNOSIS — G8929 Other chronic pain: Secondary | ICD-10-CM

## 2021-08-11 NOTE — Therapy (Signed)
[place], [place], [address] Phone: [phone]   Fax:  [phone]  Physical Therapy Treatment  Patient Details  Name: IVEE POELLNITZ MRN: [id number] Date of Birth: [birth year] Referring Provider (PT): Lamar Blinks, MD   Encounter Date: 08/11/2021   PT End of Session - 08/11/21 1745     Visit Number 6    Number of Visits 9    Date for PT Re-Evaluation 08/24/21    Authorization Type UHC    PT Start Time 3300    PT Stop Time 1827    PT Time Calculation (min) 42 min    Activity Tolerance Patient tolerated treatment well    Behavior During Therapy [facility] for tasks assessed/performed             Past Medical History:  Diagnosis Date   Allergy    Chicken pox     Past Surgical History:  Procedure Laterality Date   DILATION AND CURETTAGE, DIAGNOSTIC / THERAPEUTIC  1992   WISDOM TOOTH EXTRACTION      There were no vitals filed for this visit.   Subjective Assessment - 08/11/21 1804     Subjective Pt reports that she went to the MD and had some fluid pulled off her L knee.  She reports 0/10 pain currently.              [facility] Adult PT Treatment/Exercise:   Therapeutic Exercise: - nu-step L7 93m while taking subjective and planning session with patient - knee ext machine - 10# - 3x10 ea - STS from 55 cm - 2x10 - lateral walking at counter - 5 laps (not today) - walking for endurance - 180' - SPC - no rest breaks   In //: (3#) - hurdle walking 4 hurdles - 3x fwd, 3x lat   - step up 6'' step - x12 ea fwd and lat      PT Short Term Goals - 06/29/21 1724       PT SHORT TERM GOAL #1   Title Pt will report understanding and adherence to her HEP in order to promote independence in the management of her primary impairments.    Baseline HEP provided at eval    Time 4    Period Weeks    Status New    Target Date 07/27/21               PT Long Term Goals - 06/29/21 1818        PT LONG TERM GOAL #1   Title Pt will achieve a FOTO score of 58% in order to demonstrate improved functional ability as it relates to her knee pain.    Baseline 38%    Time 8    Period Weeks    Status New    Target Date 08/24/21      PT LONG TERM GOAL #2   Title Pt will report ability to ambulate >30 minutes with AD in order to grocery shop without limitation.    Baseline Pt unable to walk >15-20 minutes due to pain.    Time 8    Period Weeks    Status New    Target Date 08/24/21      PT LONG TERM GOAL #3   Title Pt will achieve 5 full-depth squat with 0-3/10 pain in order to pick up groceries from the floor.    Baseline Pt unable to attempt squat due to fear/ pain.    Time 8  Period Weeks    Status New    Target Date 08/24/21      PT LONG TERM GOAL #4   Title Pt will achieve 5xSTS of 15 seconds or less in order to demonstrate balance for safe community ambulation.    Baseline 21 seconds    Time 8    Period Weeks    Status New    Target Date 08/24/21      PT LONG TERM GOAL #5   Title Pt will achieve global hip/ knee strength of 4+/5 or greater in order to progress independent LE strengthening regimen without limitation.    Baseline See flowsheet    Time 8    Period Weeks    Status New    Target Date 08/24/21                   Plan - 08/11/21 1829     Clinical Impression Statement Overall, Joseline is progressing well with therapy.  Pt reports no increase in baseline pain following therapy.  Today we concentrated on lower extremity strengthening, knee strengthening, and normalizing gait.  Pt continues to progress LE strength and endurance with increased volume and intensity added today.  She remains limited by a L>R knee flexion contracture but is progressing ability to ambulate consistently.  Pt will continue to benefit from skilled physical therapy to address remaining deficits and achieve listed goals.  Continue per POC.    Personal Factors and  Comorbidities Fitness;Time since onset of injury/illness/exacerbation    Examination-Activity Limitations Sit;Squat;Stairs;Stand;Lift;Locomotion Level;Carry;Bend;Transfers    Examination-Participation Restrictions Shop;Occupation;Community Activity    Stability/Clinical Decision Making Evolving/Moderate complexity    Rehab Potential Good    PT Frequency 2x / week    PT Duration 8 weeks    PT Treatment/Interventions ADLs/Self Care Home Management;Cryotherapy;Moist Heat;Gait training;Stair training;Functional mobility training;Therapeutic activities;Therapeutic exercise;Neuromuscular re-education;Patient/family education;Manual techniques;Passive range of motion;Dry needling;Taping;Aquatic Therapy;Vasopneumatic Device;Compression bandaging;Balance training    PT Next Visit Plan Progress hip/ knee closed-chain strengthening, assess response to HEP    PT Home Exercise Plan [id number]    Consulted and Agree with Plan of Care Patient             Patient will benefit from skilled therapeutic intervention in order to improve the following deficits and impairments:  Abnormal gait, Decreased activity tolerance, Decreased balance, Decreased endurance, Decreased mobility, Decreased range of motion, Decreased strength, Difficulty walking, Pain, Impaired flexibility, Hypomobility  Visit Diagnosis: Chronic pain of right knee  Chronic pain of left knee  Muscle weakness (generalized)  Unsteadiness on feet     Problem List Patient Active Problem List   Diagnosis Date Noted   IDA (iron deficiency anemia) 04/13/2021   Vitamin D deficiency 03/02/2021   Symptomatic mammary hypertrophy 04/28/2020   Shoulder pain 04/28/2020   Back pain 04/28/2020   Impingement syndrome of left ankle 12/30/2019   Prediabetes 12/23/2019   Right ankle pain 02/16/2018    Mathis Dad, PT 08/11/2021, 6:31 PM  [facility] [address] [place], [place],  [address] Phone: [phone]   Fax:  [phone]  Name: TENEIL SHILLER MRN: [id number] Date of Birth: 20-Jul-1971

## 2021-08-18 ENCOUNTER — Other Ambulatory Visit: Payer: Self-pay

## 2021-08-18 ENCOUNTER — Ambulatory Visit: Payer: No Typology Code available for payment source | Admitting: Physical Therapy

## 2021-08-18 ENCOUNTER — Encounter: Payer: Self-pay | Admitting: Physical Therapy

## 2021-08-18 DIAGNOSIS — M6281 Muscle weakness (generalized): Secondary | ICD-10-CM | POA: Insufficient documentation

## 2021-08-18 DIAGNOSIS — G8929 Other chronic pain: Secondary | ICD-10-CM | POA: Insufficient documentation

## 2021-08-18 DIAGNOSIS — R2681 Unsteadiness on feet: Secondary | ICD-10-CM | POA: Insufficient documentation

## 2021-08-18 DIAGNOSIS — D509 Iron deficiency anemia, unspecified: Secondary | ICD-10-CM | POA: Diagnosis present

## 2021-08-18 DIAGNOSIS — M25562 Pain in left knee: Secondary | ICD-10-CM | POA: Insufficient documentation

## 2021-08-18 DIAGNOSIS — D563 Thalassemia minor: Secondary | ICD-10-CM | POA: Diagnosis not present

## 2021-08-18 DIAGNOSIS — M25561 Pain in right knee: Secondary | ICD-10-CM | POA: Insufficient documentation

## 2021-08-18 NOTE — Therapy (Signed)
[place], [place], [address] Phone: [phone]   Fax:  [phone]  Physical Therapy Treatment  Patient Details  Name: Samantha Holden MRN: [id number] Date of Birth: 21-Oct-1970 Referring Provider (PT): Lamar Blinks, MD   Encounter Date: 08/18/2021   PT End of Session - 08/18/21 1707     Visit Number 7    Number of Visits 9    Date for PT Re-Evaluation 08/24/21    Authorization Type UHC    PT Start Time 1707    PT Stop Time 1745    PT Time Calculation (min) 38 min    Activity Tolerance Patient tolerated treatment well    Behavior During Therapy [facility] for tasks assessed/performed             Past Medical History:  Diagnosis Date   Allergy    Chicken pox     Past Surgical History:  Procedure Laterality Date   DILATION AND CURETTAGE, DIAGNOSTIC / THERAPEUTIC  1992   WISDOM TOOTH EXTRACTION      There were no vitals filed for this visit.   Subjective Assessment - 08/18/21 1711     Subjective Pt reports that she was able to walk a short distance at work for the first time today.  She enters clinic using [place].  She reports 1-2/10 pain in bil knees currently.             [facility] Adult PT Treatment/Exercise:   Therapeutic Exercise: - nu-step L7 18m LE only while taking subjective and planning session with patient - knee ext machine - 15# - 3x10 ea - STS from 51 cm - 2x10 - Leg press - 2x10 - 55#   In //: (5#) - hurdle walking 4 hurdles - 3x fwd, 3x lat   - step up 6'' step - x15 ea fwd and lat      PT Short Term Goals - 06/29/21 1724       PT SHORT TERM GOAL #1   Title Pt will report understanding and adherence to her HEP in order to promote independence in the management of her primary impairments.    Baseline HEP provided at eval    Time 4    Period Weeks    Status New    Target Date 07/27/21               PT Long Term Goals - 06/29/21 1818       PT LONG TERM  GOAL #1   Title Pt will achieve a FOTO score of 58% in order to demonstrate improved functional ability as it relates to her knee pain.    Baseline 38%    Time 8    Period Weeks    Status New    Target Date 08/24/21      PT LONG TERM GOAL #2   Title Pt will report ability to ambulate >30 minutes with AD in order to grocery shop without limitation.    Baseline Pt unable to walk >15-20 minutes due to pain.    Time 8    Period Weeks    Status New    Target Date 08/24/21      PT LONG TERM GOAL #3   Title Pt will achieve 5 full-depth squat with 0-3/10 pain in order to pick up groceries from the floor.    Baseline Pt unable to attempt squat due to fear/ pain.    Time 8    Period  Weeks    Status New    Target Date 08/24/21      PT LONG TERM GOAL #4   Title Pt will achieve 5xSTS of 15 seconds or less in order to demonstrate balance for safe community ambulation.    Baseline 21 seconds    Time 8    Period Weeks    Status New    Target Date 08/24/21      PT LONG TERM GOAL #5   Title Pt will achieve global hip/ knee strength of 4+/5 or greater in order to progress independent LE strengthening regimen without limitation.    Baseline See flowsheet    Time 8    Period Weeks    Status New    Target Date 08/24/21                   Plan - 08/18/21 1735     Clinical Impression Statement Overall, Emmersen is progressing well with therapy.  Pt reports no increase in baseline pain following therapy.  Today we concentrated on lower extremity strengthening, knee strengthening, and functional strengthening.  Pt continues to progress load of lower extremity exercises with reduced pain during ADLs.  She is beginning to walk for short distances at work, showing functional progression.  Pt will continue to benefit from skilled physical therapy to address remaining deficits and achieve listed goals.  Continue per POC.    Personal Factors and Comorbidities Fitness;Time since onset of  injury/illness/exacerbation    Examination-Activity Limitations Sit;Squat;Stairs;Stand;Lift;Locomotion Level;Carry;Bend;Transfers    Examination-Participation Restrictions Shop;Occupation;Community Activity    Stability/Clinical Decision Making Evolving/Moderate complexity    Rehab Potential Good    PT Frequency 2x / week    PT Duration 8 weeks    PT Treatment/Interventions ADLs/Self Care Home Management;Cryotherapy;Moist Heat;Gait training;Stair training;Functional mobility training;Therapeutic activities;Therapeutic exercise;Neuromuscular re-education;Patient/family education;Manual techniques;Passive range of motion;Dry needling;Taping;Aquatic Therapy;Vasopneumatic Device;Compression bandaging;Balance training    PT Next Visit Plan Progress hip/ knee closed-chain strengthening, assess response to HEP    PT Home Exercise Plan [id number]    Consulted and Agree with Plan of Care Patient             Patient will benefit from skilled therapeutic intervention in order to improve the following deficits and impairments:  Abnormal gait, Decreased activity tolerance, Decreased balance, Decreased endurance, Decreased mobility, Decreased range of motion, Decreased strength, Difficulty walking, Pain, Impaired flexibility, Hypomobility  Visit Diagnosis: Chronic pain of right knee  Chronic pain of left knee  Muscle weakness (generalized)  Unsteadiness on feet     Problem List Patient Active Problem List   Diagnosis Date Noted   IDA (iron deficiency anemia) 04/13/2021   Vitamin D deficiency 03/02/2021   Symptomatic mammary hypertrophy 04/28/2020   Shoulder pain 04/28/2020   Back pain 04/28/2020   Impingement syndrome of left ankle 12/30/2019   Prediabetes 12/23/2019   Right ankle pain 02/16/2018    Mathis Dad, PT 08/18/2021, 6:26 PM  [facility] [address] [place], [place], [address] Phone: [phone]   Fax:   [phone]  Name: GLORYA BARTLEY MRN: [id number] Date of Birth: 1971-03-30

## 2021-08-25 ENCOUNTER — Encounter: Payer: Self-pay | Admitting: Physical Therapy

## 2021-08-25 ENCOUNTER — Other Ambulatory Visit: Payer: Self-pay

## 2021-08-25 ENCOUNTER — Ambulatory Visit: Payer: No Typology Code available for payment source | Admitting: Physical Therapy

## 2021-08-25 DIAGNOSIS — D509 Iron deficiency anemia, unspecified: Secondary | ICD-10-CM | POA: Diagnosis not present

## 2021-08-25 DIAGNOSIS — G8929 Other chronic pain: Secondary | ICD-10-CM

## 2021-08-25 DIAGNOSIS — R2681 Unsteadiness on feet: Secondary | ICD-10-CM

## 2021-08-25 DIAGNOSIS — M25562 Pain in left knee: Secondary | ICD-10-CM

## 2021-08-25 DIAGNOSIS — M6281 Muscle weakness (generalized): Secondary | ICD-10-CM

## 2021-08-25 NOTE — Therapy (Signed)
[place], [place], [address] Phone: [phone]   Fax:  [phone]  Physical Therapy Treatment  Patient Details  Name: Samantha Holden MRN: [id number] Date of Birth: Aug 28, 1971 Referring Provider (PT): Lamar Blinks, MD   Encounter Date: 08/25/2021   PT End of Session - 08/25/21 1743     Visit Number 8    Number of Visits 24    Date for PT Re-Evaluation 10/20/21   extended   Authorization Type UHC    PT Start Time 2263    PT Stop Time 1826    PT Time Calculation (min) 42 min    Activity Tolerance Patient tolerated treatment well    Behavior During Therapy [facility] for tasks assessed/performed             Past Medical History:  Diagnosis Date   Allergy    Chicken pox     Past Surgical History:  Procedure Laterality Date   DILATION AND CURETTAGE, DIAGNOSTIC / THERAPEUTIC  1992   WISDOM TOOTH EXTRACTION      There were no vitals filed for this visit.   Subjective Assessment - 08/25/21 1749     Subjective Pt reports that she feeling stiff today, likely because of the weather.  She feels that therapy has been helpful and that she is improving.  She enters clinic using [place].  She reports 4/10 pain in L knees currently.              Objective:   5x STS: 47''  MMT: knee flexion: L 4/5, R 5/5 knee ext: L 4/5, R5/5  FOTO: 18  [facility] Adult PT Treatment/Exercise:  Therapeutic Activity - collecting information for goals, checking progress, and reviewing with patient   Therapeutic Exercise: - nu-step L7 66m LE only while taking subjective and planning session with patient - knee ext machine - 15# - 3x10 ea - STS from 51 cm - 2x10 - w/ [place] press - Leg press - 2x10 - 55# 2x10 @75 #   NOT PERFORMED TODAY:  In //: (5#) - hurdle walking 4 hurdles - 3x fwd, 3x lat   - step up 6'' step - x15 ea fwd and lat     PT Short Term Goals - 08/25/21 1804       PT SHORT TERM GOAL #1   Title  Pt will report understanding and adherence to her HEP in order to promote independence in the management of her primary impairments.    Baseline HEP provided at eval    Time 4    Period Weeks    Status Achieved    Target Date 07/27/21               PT Long Term Goals - 08/25/21 1751       PT LONG TERM GOAL #1   Title Pt will achieve a FOTO score of 58% in order to demonstrate improved functional ability as it relates to her knee pain.    Baseline 38% 12/14: 55    Time 8    Period Weeks    Status On-going    Target Date 10/20/21      PT LONG TERM GOAL #2   Title Pt will report ability to ambulate >30 minutes with AD in order to grocery shop without limitation.    Baseline Pt unable to walk prior per pt "barely to restroom).  12/14: 15 min but no longer limited by pain    Time 8  Period Weeks    Status On-going    Target Date 10/20/21      PT LONG TERM GOAL #3   Title Pt will achieve 5 full-depth squat with 0-3/10 pain in order to pick up groceries from the floor.    Baseline Pt unable to attempt squat due to fear/ pain. 12/14: 5 90 degree squats with UE support    Time 8    Period Weeks    Status On-going    Target Date 10/20/21      PT LONG TERM GOAL #4   Title Pt will achieve 5xSTS of 15 seconds or less in order to demonstrate balance for safe community ambulation.    Baseline 21 seconds 12/14: 18''    Time 8    Period Weeks    Status On-going    Target Date 10/20/21      PT LONG TERM GOAL #5   Title Pt will achieve global hip/ knee strength of 4+/5 or greater in order to progress independent LE strengthening regimen without limitation.    Baseline See flowsheet 12/14: MMT: knee flexion: L 4/5, R 5/5 knee ext: L 4/5, R5/5    Time 8    Period Weeks    Status On-going    Target Date 10/20/21                   Plan - 08/25/21 1802     Clinical Impression Statement KEI LANGHORST has progressed well with therapy.  Improved impairments include:  knee strength, standing tolerance.  Functional improvements include: ability to navigate steps and ambulate for up to 15 min with SPC.  Progressions needed include: continued knee and hip strengthening to allow return to PLOF.  Barriers to progress include: knee flexion contractures.  Please see baseline and/or status section in "Goals" for specific progress on short term and long term goals established at evaluation.  I recommend continuation of PT to allow completion of remaining goals and continued functional progression.    Personal Factors and Comorbidities Fitness;Time since onset of injury/illness/exacerbation    Examination-Activity Limitations Sit;Squat;Stairs;Stand;Lift;Locomotion Level;Carry;Bend;Transfers    Examination-Participation Restrictions Shop;Occupation;Community Activity    Stability/Clinical Decision Making Evolving/Moderate complexity    Rehab Potential Good    PT Frequency 2x / week    PT Duration 8 weeks    PT Treatment/Interventions ADLs/Self Care Home Management;Cryotherapy;Moist Heat;Gait training;Stair training;Functional mobility training;Therapeutic activities;Therapeutic exercise;Neuromuscular re-education;Patient/family education;Manual techniques;Passive range of motion;Dry needling;Taping;Aquatic Therapy;Vasopneumatic Device;Compression bandaging;Balance training    PT Next Visit Plan Progress hip/ knee closed-chain strengthening, assess response to HEP    PT Home Exercise Plan [id number]    Consulted and Agree with Plan of Care Patient             Patient will benefit from skilled therapeutic intervention in order to improve the following deficits and impairments:  Abnormal gait, Decreased activity tolerance, Decreased balance, Decreased endurance, Decreased mobility, Decreased range of motion, Decreased strength, Difficulty walking, Pain, Impaired flexibility, Hypomobility  Visit Diagnosis: Chronic pain of right knee - Plan: PT plan of care  cert/re-cert  Chronic pain of left knee - Plan: PT plan of care cert/re-cert  Muscle weakness (generalized) - Plan: PT plan of care cert/re-cert  Unsteadiness on feet - Plan: PT plan of care cert/re-cert     Problem List Patient Active Problem List   Diagnosis Date Noted   IDA (iron deficiency anemia) 04/13/2021   Vitamin D deficiency 03/02/2021   Symptomatic mammary hypertrophy 04/28/2020   Shoulder  pain 04/28/2020   Back pain 04/28/2020   Impingement syndrome of left ankle 12/30/2019   Prediabetes 12/23/2019   Right ankle pain 02/16/2018    Mathis Dad, PT 08/25/2021, 6:13 PM  [facility] [address] [place], [place], [address] Phone: [phone]   Fax:  [phone]  Name: AIDA LEMAIRE MRN: [id number] Date of Birth: Jul 03, 1971

## 2021-09-01 ENCOUNTER — Encounter: Payer: Self-pay | Admitting: Family Medicine

## 2021-09-01 DIAGNOSIS — N950 Postmenopausal bleeding: Secondary | ICD-10-CM

## 2021-09-02 NOTE — Addendum Note (Signed)
Addended by: Lamar Blinks C on: 09/02/2021 03:49 PM   Modules accepted: Orders

## 2021-09-07 ENCOUNTER — Telehealth: Payer: Self-pay | Admitting: *Deleted

## 2021-09-07 ENCOUNTER — Inpatient Hospital Stay: Payer: No Typology Code available for payment source

## 2021-09-07 ENCOUNTER — Other Ambulatory Visit: Payer: Self-pay | Admitting: Podiatry

## 2021-09-07 ENCOUNTER — Other Ambulatory Visit: Payer: Self-pay

## 2021-09-07 ENCOUNTER — Encounter: Payer: Self-pay | Admitting: Family

## 2021-09-07 ENCOUNTER — Inpatient Hospital Stay: Payer: No Typology Code available for payment source | Attending: Hematology & Oncology | Admitting: Family

## 2021-09-07 ENCOUNTER — Ambulatory Visit: Payer: No Typology Code available for payment source

## 2021-09-07 VITALS — BP 125/91 | HR 78 | Temp 98.2°F | Resp 17 | Wt 297.8 lb

## 2021-09-07 DIAGNOSIS — D563 Thalassemia minor: Secondary | ICD-10-CM | POA: Diagnosis not present

## 2021-09-07 DIAGNOSIS — D509 Iron deficiency anemia, unspecified: Secondary | ICD-10-CM | POA: Diagnosis not present

## 2021-09-07 DIAGNOSIS — R2681 Unsteadiness on feet: Secondary | ICD-10-CM

## 2021-09-07 DIAGNOSIS — M6281 Muscle weakness (generalized): Secondary | ICD-10-CM

## 2021-09-07 DIAGNOSIS — G8929 Other chronic pain: Secondary | ICD-10-CM

## 2021-09-07 LAB — CBC WITH DIFFERENTIAL (CANCER CENTER ONLY)
Abs Immature Granulocytes: 0.01 10*3/uL (ref 0.00–0.07)
Basophils Absolute: 0 10*3/uL (ref 0.0–0.1)
Basophils Relative: 0 %
Eosinophils Absolute: 0.1 10*3/uL (ref 0.0–0.5)
Eosinophils Relative: 2 %
HCT: 36.2 % (ref 36.0–46.0)
Hemoglobin: 11.4 g/dL — ABNORMAL LOW (ref 12.0–15.0)
Immature Granulocytes: 0 %
Lymphocytes Relative: 32 %
Lymphs Abs: 1.5 10*3/uL (ref 0.7–4.0)
MCH: 25.4 pg — ABNORMAL LOW (ref 26.0–34.0)
MCHC: 31.5 g/dL (ref 30.0–36.0)
MCV: 80.8 fL (ref 80.0–100.0)
Monocytes Absolute: 0.4 10*3/uL (ref 0.1–1.0)
Monocytes Relative: 8 %
Neutro Abs: 2.7 10*3/uL (ref 1.7–7.7)
Neutrophils Relative %: 58 %
Platelet Count: 262 10*3/uL (ref 150–400)
RBC: 4.48 MIL/uL (ref 3.87–5.11)
RDW: 16.6 % — ABNORMAL HIGH (ref 11.5–15.5)
WBC Count: 4.7 10*3/uL (ref 4.0–10.5)
nRBC: 0 % (ref 0.0–0.2)

## 2021-09-07 LAB — IRON AND IRON BINDING CAPACITY (CC-WL,HP ONLY)
Iron: 39 ug/dL (ref 28–170)
Saturation Ratios: 11 % (ref 10.4–31.8)
TIBC: 361 ug/dL (ref 250–450)
UIBC: 322 ug/dL (ref 148–442)

## 2021-09-07 LAB — RETICULOCYTES
Immature Retic Fract: 9.7 % (ref 2.3–15.9)
RBC.: 4.49 MIL/uL (ref 3.87–5.11)
Retic Count, Absolute: 50.3 10*3/uL (ref 19.0–186.0)
Retic Ct Pct: 1.1 % (ref 0.4–3.1)

## 2021-09-07 NOTE — Telephone Encounter (Signed)
Per 09/07/21 los - gave upcoming appointments - confirmed

## 2021-09-07 NOTE — Therapy (Signed)
[place], [place], [address] Phone: [phone]   Fax:  [phone]  Physical Therapy Treatment  Patient Details  Name: MARGARETH KANNER MRN: [id number] Date of Birth: 28-Oct-1970 Referring Provider (PT): Lamar Blinks, MD   Encounter Date: 09/07/2021   PT End of Session - 09/07/21 1733     Visit Number 9    Number of Visits 24    Date for PT Re-Evaluation 10/20/21   extended   Authorization Type UHC    PT Start Time 5498    PT Stop Time 1818    PT Time Calculation (min) 43 min    Activity Tolerance Patient tolerated treatment well    Behavior During Therapy [facility] for tasks assessed/performed             Past Medical History:  Diagnosis Date   Allergy    Chicken pox     Past Surgical History:  Procedure Laterality Date   DILATION AND CURETTAGE, DIAGNOSTIC / THERAPEUTIC  1992   WISDOM TOOTH EXTRACTION      There were no vitals filed for this visit.   Subjective Assessment - 09/07/21 1734     Subjective Pt presents to PT with reports of bilateral knee stiffness, but overall is doing well. Has been compliant with her HEP with no adverse effect. She is ready to begin PT at this time.    Currently in Pain? Yes    Pain Score 1     Pain Location Knee    Pain Orientation Right;Left           [facility] Adult PT Treatment/Exercise:   Therapeutic Exercise: - nu-step L7 13m LE only while taking subjective and planning session with patient - single knee ext machine - 15# - 3x10 ea - STS from 51 cm - 2x10 - Leg press - 2x10 - 65#   In //: (5#) - hurdle walking 4 hurdles - 3x fwd, 3x lat - step up 6'' step - x15 ea fwd and lat                               PT Short Term Goals - 08/25/21 1804       PT SHORT TERM GOAL #1   Title Pt will report understanding and adherence to her HEP in order to promote independence in the management of her primary impairments.     Baseline HEP provided at eval    Time 4    Period Weeks    Status Achieved    Target Date 07/27/21               PT Long Term Goals - 08/25/21 1751       PT LONG TERM GOAL #1   Title Pt will achieve a FOTO score of 58% in order to demonstrate improved functional ability as it relates to her knee pain.    Baseline 38% 12/14: 55    Time 8    Period Weeks    Status On-going    Target Date 10/20/21      PT LONG TERM GOAL #2   Title Pt will report ability to ambulate >30 minutes with AD in order to grocery shop without limitation.    Baseline Pt unable to walk prior per pt "barely to restroom).  12/14: 15 min but no longer limited by pain    Time 8    Period Weeks  Status On-going    Target Date 10/20/21      PT LONG TERM GOAL #3   Title Pt will achieve 5 full-depth squat with 0-3/10 pain in order to pick up groceries from the floor.    Baseline Pt unable to attempt squat due to fear/ pain. 12/14: 5 90 degree squats with UE support    Time 8    Period Weeks    Status On-going    Target Date 10/20/21      PT LONG TERM GOAL #4   Title Pt will achieve 5xSTS of 15 seconds or less in order to demonstrate balance for safe community ambulation.    Baseline 21 seconds 12/14: 18''    Time 8    Period Weeks    Status On-going    Target Date 10/20/21      PT LONG TERM GOAL #5   Title Pt will achieve global hip/ knee strength of 4+/5 or greater in order to progress independent LE strengthening regimen without limitation.    Baseline See flowsheet 12/14: MMT: knee flexion: L 4/5, R 5/5 knee ext: L 4/5, R5/5    Time 8    Period Weeks    Status On-going    Target Date 10/20/21                   Plan - 09/07/21 1814     Clinical Impression Statement Pt was able to complete all prescribed exercies with no adverse effect or increase in pain. Therapy today continued to focus on improving proximal hip and quad strength in order to improve mobility and gait. She continues  to benefit from skilled PT services and will continue to be seen and progressed as tolerated.    PT Treatment/Interventions ADLs/Self Care Home Management;Cryotherapy;Moist Heat;Gait training;Stair training;Functional mobility training;Therapeutic activities;Therapeutic exercise;Neuromuscular re-education;Patient/family education;Manual techniques;Passive range of motion;Dry needling;Taping;Aquatic Therapy;Vasopneumatic Device;Compression bandaging;Balance training    PT Next Visit Plan Progress hip/ knee closed-chain strengthening, assess response to HEP    PT Home Exercise Plan [id number]             Patient will benefit from skilled therapeutic intervention in order to improve the following deficits and impairments:  Abnormal gait, Decreased activity tolerance, Decreased balance, Decreased endurance, Decreased mobility, Decreased range of motion, Decreased strength, Difficulty walking, Pain, Impaired flexibility, Hypomobility  Visit Diagnosis: Chronic pain of right knee  Chronic pain of left knee  Muscle weakness (generalized)  Unsteadiness on feet     Problem List Patient Active Problem List   Diagnosis Date Noted   IDA (iron deficiency anemia) 04/13/2021   Vitamin D deficiency 03/02/2021   Symptomatic mammary hypertrophy 04/28/2020   Shoulder pain 04/28/2020   Back pain 04/28/2020   Impingement syndrome of left ankle 12/30/2019   Prediabetes 12/23/2019   Right ankle pain 02/16/2018    Ward Chatters, PT 09/07/2021, 6:22 PM  [facility] [address] [place], [place], [address] Phone: [phone]   Fax:  [phone]  Name: TOMEKO SCOVILLE MRN: [id number] Date of Birth: 03-06-1971

## 2021-09-07 NOTE — Progress Notes (Signed)
Hematology and Oncology Follow Up Visit  Samantha Holden [id number] October 24, 1970 50 y.o. 09/07/2021   Principle Diagnosis:  Iron deficiency anemia  Alpha thalassemia minor trait    Current Therapy:        IV iron as indicated  Folic acid 1 mg PO daily   Interim History:  Samantha Holden is here today for follow-up. She is doing well and notes that she is feeling much better since receiving IV iron in October.  Her energy has improved.  She did have a cycle again for the first time in a year. Her flow was heavy for two days. No other blood loss noted.  No bruising or petechiae.  No fever, chills, n/v, cough, rash, dizziness, SOB, chest pain, palpitations, abdominal pain or changes in bowel or bladder habits.  No swelling, tenderness, numbness or tingling in her extremities.  No falls or syncope.  She has maintained a good appetite and is staying well hydrated. Her weight is stable at 297 lbs.   ECOG Performance Status: 1 - Symptomatic but completely ambulatory  Medications:  Allergies as of 09/07/2021   No Known Allergies      Medication List        Accurate as of September 07, 2021  1:59 PM. If you have any questions, ask your nurse or doctor.          folic acid 1 MG tablet Commonly known as: FOLVITE Take 1 tablet (1 mg total) by mouth daily.   meloxicam 15 MG tablet Commonly known as: MOBIC TAKE 1 TABLET BY MOUTH EVERY DAY   Vitamin D3 1.25 MG (50000 UT) Caps TAKE 1 WEEKLY FOR 12 WEEKS        Allergies: No Known Allergies  Past Medical History, Surgical history, Social history, and Family History were reviewed and updated.  Review of Systems: All other 10 point review of systems is negative.   Physical Exam:  weight is 297 lb 12.8 oz (135.1 kg). Her oral temperature is 98.2 F (36.8 C). Her blood pressure is 125/91 (abnormal) and her pulse is 78. Her respiration is 17 and oxygen saturation is 100%.   Wt Readings from Last 3 Encounters:  09/07/21 297 lb  12.8 oz (135.1 kg)  06/07/21 275 lb 12.8 oz (125.1 kg)  04/12/21 271 lb 6.4 oz (123.1 kg)    Ocular: Sclerae unicteric, pupils equal, round and reactive to light Ear-nose-throat: Oropharynx clear, dentition fair Lymphatic: No cervical or supraclavicular adenopathy Lungs no rales or rhonchi, good excursion bilaterally Heart regular rate and rhythm, no murmur appreciated Abd soft, nontender, positive bowel sounds MSK no focal spinal tenderness, no joint edema Neuro: non-focal, well-oriented, appropriate affect Breasts: Deferred   Lab Results  Component Value Date   WBC 4.7 09/07/2021   HGB 11.4 (L) 09/07/2021   HCT 36.2 09/07/2021   MCV 80.8 09/07/2021   PLT 262 09/07/2021   Lab Results  Component Value Date   FERRITIN 111 06/07/2021   IRON 45 06/07/2021   TIBC 365 06/07/2021   UIBC 320 06/07/2021   IRONPCTSAT 12 (L) 06/07/2021   Lab Results  Component Value Date   RETICCTPCT 1.1 09/07/2021   RBC 4.49 09/07/2021   No results found for: KPAFRELGTCHN, LAMBDASER, KAPLAMBRATIO No results found for: IGGSERUM, IGA, IGMSERUM No results found for: Ronnald Ramp, A1GS, A2GS, BETS, BETA2SER, GAMS, MSPIKE, SPEI   Chemistry      Component Value Date/Time   NA 139 04/12/2021 0858   K 4.1 04/12/2021 0858  CL 104 04/12/2021 0858   CO2 27 04/12/2021 0858   BUN 18 04/12/2021 0858   CREATININE 0.76 04/12/2021 0858      Component Value Date/Time   CALCIUM 9.8 04/12/2021 0858   ALKPHOS 85 04/12/2021 0858   AST 10 (L) 04/12/2021 0858   ALT 6 04/12/2021 0858   BILITOT 0.3 04/12/2021 0858       Impression and Plan: Samantha Holden is a very pleasant 50 yo African American female with iron deficiency anemia and alpha thalassemia minor trait.  Iron studies are pending. We will replace if needed.  Follow-up in 3 months.   Samantha Dawson, NP 12/27/20221:59 PM

## 2021-09-08 ENCOUNTER — Other Ambulatory Visit: Payer: Self-pay | Admitting: Family

## 2021-09-08 ENCOUNTER — Telehealth: Payer: Self-pay | Admitting: Family

## 2021-09-08 LAB — FERRITIN: Ferritin: 72 ng/mL (ref 11–307)

## 2021-09-08 NOTE — Telephone Encounter (Signed)
Called patient per 12/28 sch msg. No answer. Left message for patient to call back to schedule appts,  '

## 2021-09-09 ENCOUNTER — Telehealth: Payer: Self-pay | Admitting: Family

## 2021-09-09 NOTE — Telephone Encounter (Signed)
Scheduling Message Entered by Theadore Nan on 09/08/2021 at  1:14 PM Priority: Routine <No visit type provided>  Department: [facility]  Provider:   Scheduling Notes:  She needs 2 doses of IV iron please. Thank you!  Called patient per 12/28 sch msg - no answer - left message for 2x for patient to call office to schedule appt.

## 2021-09-14 ENCOUNTER — Encounter: Payer: Self-pay | Admitting: Physical Therapy

## 2021-09-14 ENCOUNTER — Ambulatory Visit: Payer: No Typology Code available for payment source | Attending: Family Medicine | Admitting: Physical Therapy

## 2021-09-14 ENCOUNTER — Other Ambulatory Visit: Payer: Self-pay

## 2021-09-14 DIAGNOSIS — M6281 Muscle weakness (generalized): Secondary | ICD-10-CM

## 2021-09-14 DIAGNOSIS — M25561 Pain in right knee: Secondary | ICD-10-CM | POA: Insufficient documentation

## 2021-09-14 DIAGNOSIS — G8929 Other chronic pain: Secondary | ICD-10-CM | POA: Diagnosis present

## 2021-09-14 DIAGNOSIS — M25562 Pain in left knee: Secondary | ICD-10-CM | POA: Diagnosis present

## 2021-09-14 DIAGNOSIS — R2681 Unsteadiness on feet: Secondary | ICD-10-CM

## 2021-09-14 NOTE — Therapy (Signed)
[place], [place], [address] Phone: [phone]   Fax:  [phone]  Physical Therapy Treatment  Patient Details  Name: Samantha Holden MRN: [id number] Date of Birth: Jan 05, 1971 Referring Provider (PT): Lamar Blinks, MD   Encounter Date: 09/14/2021   PT End of Session - 09/14/21 1705     Visit Number 10    Number of Visits 24    Date for PT Re-Evaluation 10/20/21   extended   Authorization Type UHC    PT Start Time 1703    PT Stop Time 1744    PT Time Calculation (min) 41 min    Activity Tolerance Patient tolerated treatment well    Behavior During Therapy [facility] for tasks assessed/performed             Past Medical History:  Diagnosis Date   Allergy    Chicken pox     Past Surgical History:  Procedure Laterality Date   DILATION AND CURETTAGE, DIAGNOSTIC / THERAPEUTIC  1992   WISDOM TOOTH EXTRACTION      There were no vitals filed for this visit.   Subjective Assessment - 09/14/21 1710     Subjective Pt reports that she is doing well overall.  She has been walking more at home with a cane.  She has stifness in both knees.               [facility] Adult PT Treatment/Exercise:   Therapeutic Exercise: - nu-step L7 65m LE only while taking subjective and planning session with patient - single knee ext machine - 20# - 3x10 ea - STS from 51 cm - 2x10 (NT) - Leg press - 2x10 - 75#   In //: (5#) - hurdle walking 4 hurdles - 3x fwd, 3x lat - step up 8'' step - x15 ea fwd and lat     PT Short Term Goals - 08/25/21 1804       PT SHORT TERM GOAL #1   Title Pt will report understanding and adherence to her HEP in order to promote independence in the management of her primary impairments.    Baseline HEP provided at eval    Time 4    Period Weeks    Status Achieved    Target Date 07/27/21               PT Long Term Goals - 08/25/21 1751       PT LONG TERM GOAL #1   Title  Pt will achieve a FOTO score of 58% in order to demonstrate improved functional ability as it relates to her knee pain.    Baseline 38% 12/14: 55    Time 8    Period Weeks    Status On-going    Target Date 10/20/21      PT LONG TERM GOAL #2   Title Pt will report ability to ambulate >30 minutes with AD in order to grocery shop without limitation.    Baseline Pt unable to walk prior per pt "barely to restroom).  12/14: 15 min but no longer limited by pain    Time 8    Period Weeks    Status On-going    Target Date 10/20/21      PT LONG TERM GOAL #3   Title Pt will achieve 5 full-depth squat with 0-3/10 pain in order to pick up groceries from the floor.    Baseline Pt unable to attempt squat due to fear/ pain.  12/14: 5 90 degree squats with UE support    Time 8    Period Weeks    Status On-going    Target Date 10/20/21      PT LONG TERM GOAL #4   Title Pt will achieve 5xSTS of 15 seconds or less in order to demonstrate balance for safe community ambulation.    Baseline 21 seconds 12/14: 18''    Time 8    Period Weeks    Status On-going    Target Date 10/20/21      PT LONG TERM GOAL #5   Title Pt will achieve global hip/ knee strength of 4+/5 or greater in order to progress independent LE strengthening regimen without limitation.    Baseline See flowsheet 12/14: MMT: knee flexion: L 4/5, R 5/5 knee ext: L 4/5, R5/5    Time 8    Period Weeks    Status On-going    Target Date 10/20/21                   Plan - 09/14/21 1724     Clinical Impression Statement Kinsley is progressing well with therapy.  Pt reports no increase in baseline pain following therapy.  Today we concentrated on lower extremity strengthening and hip strengthening.  Pt continues to improve LE strength and activity tolerance and is able to ambulate for longer distances with only SPC compared to several weeks ago.  Pt will continue to benefit from skilled physical therapy to address remaining deficits and  achieve listed goals.  Continue per POC.    PT Treatment/Interventions ADLs/Self Care Home Management;Cryotherapy;Moist Heat;Gait training;Stair training;Functional mobility training;Therapeutic activities;Therapeutic exercise;Neuromuscular re-education;Patient/family education;Manual techniques;Passive range of motion;Dry needling;Taping;Aquatic Therapy;Vasopneumatic Device;Compression bandaging;Balance training    PT Next Visit Plan Progress hip/ knee closed-chain strengthening, assess response to HEP    PT Home Exercise Plan [id number]             Patient will benefit from skilled therapeutic intervention in order to improve the following deficits and impairments:  Abnormal gait, Decreased activity tolerance, Decreased balance, Decreased endurance, Decreased mobility, Decreased range of motion, Decreased strength, Difficulty walking, Pain, Impaired flexibility, Hypomobility  Visit Diagnosis: Chronic pain of right knee  Chronic pain of left knee  Muscle weakness (generalized)  Unsteadiness on feet     Problem List Patient Active Problem List   Diagnosis Date Noted   IDA (iron deficiency anemia) 04/13/2021   Vitamin D deficiency 03/02/2021   Symptomatic mammary hypertrophy 04/28/2020   Shoulder pain 04/28/2020   Back pain 04/28/2020   Impingement syndrome of left ankle 12/30/2019   Prediabetes 12/23/2019   Right ankle pain 02/16/2018    Mathis Dad, PT 09/14/2021, 5:44 PM  [facility] [address] [place], [place], [address] Phone: [phone]   Fax:  [phone]  Name: Samantha Holden MRN: [id number] Date of Birth: Jun 11, 1971

## 2021-09-16 ENCOUNTER — Ambulatory Visit: Payer: No Typology Code available for payment source

## 2021-09-16 ENCOUNTER — Other Ambulatory Visit: Payer: Self-pay

## 2021-09-16 DIAGNOSIS — M6281 Muscle weakness (generalized): Secondary | ICD-10-CM

## 2021-09-16 DIAGNOSIS — R2681 Unsteadiness on feet: Secondary | ICD-10-CM

## 2021-09-16 DIAGNOSIS — M25561 Pain in right knee: Secondary | ICD-10-CM | POA: Diagnosis not present

## 2021-09-16 DIAGNOSIS — G8929 Other chronic pain: Secondary | ICD-10-CM

## 2021-09-16 NOTE — Therapy (Signed)
[place], [place], [address] Phone: [phone]   Fax:  [phone]  Physical Therapy Treatment  Patient Details  Name: Samantha Holden MRN: [id number] Date of Birth: 1970/12/19 Referring Provider (PT): Lamar Blinks, MD   Encounter Date: 09/16/2021   PT End of Session - 09/16/21 1828     Visit Number 11    Number of Visits 24    Date for PT Re-Evaluation 10/20/21   extended   Authorization Type UHC    PT Start Time 1830    PT Stop Time 1908    PT Time Calculation (min) 38 min    Activity Tolerance Patient tolerated treatment well    Behavior During Therapy [facility] for tasks assessed/performed             Past Medical History:  Diagnosis Date   Allergy    Chicken pox     Past Surgical History:  Procedure Laterality Date   DILATION AND CURETTAGE, DIAGNOSTIC / THERAPEUTIC  1992   WISDOM TOOTH EXTRACTION      There were no vitals filed for this visit.   Subjective Assessment - 09/16/21 1828     Subjective Pt presents to PT noting she is doing well. Denies knee pain or discomfort. Drove herself to therapy today, notes that she is getting more confident. Pt is ready to begin PT treatment at this time.    Currently in Pain? No/denies    Pain Score 0-No pain           [facility] Adult PT Treatment/Exercise:   Therapeutic Exercise: - nu-step L7 65m LE only while taking subjective and planning session with patient - single knee ext machine - 20# - 3x10 ea - STS from 51 cm - 3x10  - Leg press - 3x10 - 75# - Hip abduction machine x 10 30# ea - Step up 8" x 10 5lb on ea LE   In //: (5#) - hurdle walking 4 hurdles - 3x fwd, 3x lat (NT) - step up 8'' step - x15 ea fwd and lat (NT)                               PT Short Term Goals - 08/25/21 1804       PT SHORT TERM GOAL #1   Title Pt will report understanding and adherence to her HEP in order to promote independence  in the management of her primary impairments.    Baseline HEP provided at eval    Time 4    Period Weeks    Status Achieved    Target Date 07/27/21               PT Long Term Goals - 08/25/21 1751       PT LONG TERM GOAL #1   Title Pt will achieve a FOTO score of 58% in order to demonstrate improved functional ability as it relates to her knee pain.    Baseline 38% 12/14: 55    Time 8    Period Weeks    Status On-going    Target Date 10/20/21      PT LONG TERM GOAL #2   Title Pt will report ability to ambulate >30 minutes with AD in order to grocery shop without limitation.    Baseline Pt unable to walk prior per pt "barely to restroom).  12/14: 15 min but no longer limited by  pain    Time 8    Period Weeks    Status On-going    Target Date 10/20/21      PT LONG TERM GOAL #3   Title Pt will achieve 5 full-depth squat with 0-3/10 pain in order to pick up groceries from the floor.    Baseline Pt unable to attempt squat due to fear/ pain. 12/14: 5 90 degree squats with UE support    Time 8    Period Weeks    Status On-going    Target Date 10/20/21      PT LONG TERM GOAL #4   Title Pt will achieve 5xSTS of 15 seconds or less in order to demonstrate balance for safe community ambulation.    Baseline 21 seconds 12/14: 18''    Time 8    Period Weeks    Status On-going    Target Date 10/20/21      PT LONG TERM GOAL #5   Title Pt will achieve global hip/ knee strength of 4+/5 or greater in order to progress independent LE strengthening regimen without limitation.    Baseline See flowsheet 12/14: MMT: knee flexion: L 4/5, R 5/5 knee ext: L 4/5, R5/5    Time 8    Period Weeks    Status On-going    Target Date 10/20/21                   Plan - 09/16/21 1904     Clinical Impression Statement Pt was able to complete prescribed exercises with no adverse effect. She continues to progress well with therapy, showing continued improvement in strength and activity  tolerance. PT will cotinue to progress pt as tolerated per POC.    PT Treatment/Interventions ADLs/Self Care Home Management;Cryotherapy;Moist Heat;Gait training;Stair training;Functional mobility training;Therapeutic activities;Therapeutic exercise;Neuromuscular re-education;Patient/family education;Manual techniques;Passive range of motion;Dry needling;Taping;Aquatic Therapy;Vasopneumatic Device;Compression bandaging;Balance training    PT Next Visit Plan Progress hip/ knee closed-chain strengthening, assess response to HEP    PT Home Exercise Plan [id number]             Patient will benefit from skilled therapeutic intervention in order to improve the following deficits and impairments:  Abnormal gait, Decreased activity tolerance, Decreased balance, Decreased endurance, Decreased mobility, Decreased range of motion, Decreased strength, Difficulty walking, Pain, Impaired flexibility, Hypomobility  Visit Diagnosis: Chronic pain of right knee  Chronic pain of left knee  Muscle weakness (generalized)  Unsteadiness on feet     Problem List Patient Active Problem List   Diagnosis Date Noted   IDA (iron deficiency anemia) 04/13/2021   Vitamin D deficiency 03/02/2021   Symptomatic mammary hypertrophy 04/28/2020   Shoulder pain 04/28/2020   Back pain 04/28/2020   Impingement syndrome of left ankle 12/30/2019   Prediabetes 12/23/2019   Right ankle pain 02/16/2018    Ward Chatters, PT 09/16/2021, 7:13 PM  [facility] [address] [place], [place], [address] Phone: [phone]   Fax:  [phone]  Name: Samantha Holden MRN: [id number] Date of Birth: 1971/07/27

## 2021-09-17 ENCOUNTER — Inpatient Hospital Stay: Payer: No Typology Code available for payment source | Attending: Hematology & Oncology

## 2021-09-17 VITALS — BP 137/89 | HR 74 | Temp 98.0°F | Resp 16

## 2021-09-17 DIAGNOSIS — D509 Iron deficiency anemia, unspecified: Secondary | ICD-10-CM | POA: Diagnosis not present

## 2021-09-17 DIAGNOSIS — D563 Thalassemia minor: Secondary | ICD-10-CM | POA: Diagnosis not present

## 2021-09-17 MED ORDER — SODIUM CHLORIDE 0.9 % IV SOLN: Freq: Once | INTRAVENOUS | Status: AC

## 2021-09-17 MED ORDER — SODIUM CHLORIDE 0.9 % IV SOLN
300.0000 mg | Freq: Once | INTRAVENOUS | Status: AC
  Administered 2021-09-17: 300 mg via INTRAVENOUS
  Filled 2021-09-17: qty 300

## 2021-09-17 NOTE — Patient Instructions (Signed)

## 2021-09-21 ENCOUNTER — Encounter: Payer: Self-pay | Admitting: Physical Therapy

## 2021-09-21 ENCOUNTER — Other Ambulatory Visit: Payer: Self-pay

## 2021-09-21 ENCOUNTER — Ambulatory Visit: Payer: No Typology Code available for payment source | Admitting: Physical Therapy

## 2021-09-21 DIAGNOSIS — M25561 Pain in right knee: Secondary | ICD-10-CM

## 2021-09-21 DIAGNOSIS — G8929 Other chronic pain: Secondary | ICD-10-CM

## 2021-09-21 DIAGNOSIS — R2681 Unsteadiness on feet: Secondary | ICD-10-CM

## 2021-09-21 DIAGNOSIS — M6281 Muscle weakness (generalized): Secondary | ICD-10-CM

## 2021-09-21 DIAGNOSIS — M25562 Pain in left knee: Secondary | ICD-10-CM

## 2021-09-21 NOTE — Therapy (Signed)
[place], [place], [address] Phone: [phone]   Fax:  [phone]  Physical Therapy Treatment  Patient Details  Name: Samantha Holden MRN: [id number] Date of Birth: August 24, 1971 Referring Provider (PT): Lamar Blinks, MD   Encounter Date: 09/21/2021   PT End of Session - 09/21/21 1830     Visit Number 12    Number of Visits 24    Date for PT Re-Evaluation 10/20/21   extended   Authorization Type UHC    PT Start Time 1830    PT Stop Time 1910    PT Time Calculation (min) 40 min    Activity Tolerance Patient tolerated treatment well    Behavior During Therapy [facility] for tasks assessed/performed             Past Medical History:  Diagnosis Date   Allergy    Chicken pox     Past Surgical History:  Procedure Laterality Date   DILATION AND CURETTAGE, DIAGNOSTIC / THERAPEUTIC  1992   WISDOM TOOTH EXTRACTION      There were no vitals filed for this visit.   Subjective Assessment - 09/21/21 1837     Subjective Pt reports that she feels she is doing well.  She saw her MD who told her she needed a L TKA.  She has 1/10 pain in her L knee currently.              [facility] Adult PT Treatment/Exercise:   Therapeutic Exercise: - nu-step L7 75m LE only while taking subjective and planning session with patient - Slant board stretch - 30'' x3 - single knee ext machine - 25# - 3x10 ea - STS from 50 cm - 3x10  - Leg press - 3x10 - 80# - Hip abduction machine x 15 30# ea - Step up 8" x 10 6 lb on ea LE   In //: (5#) - hurdle walking 4 hurdles - 3x fwd, 3x lat (NT) - step up 8'' step - x15 ea fwd and lat (NT)    PT Short Term Goals - 08/25/21 1804       PT SHORT TERM GOAL #1   Title Pt will report understanding and adherence to her HEP in order to promote independence in the management of her primary impairments.    Baseline HEP provided at eval    Time 4    Period Weeks    Status Achieved     Target Date 07/27/21               PT Long Term Goals - 08/25/21 1751       PT LONG TERM GOAL #1   Title Pt will achieve a FOTO score of 58% in order to demonstrate improved functional ability as it relates to her knee pain.    Baseline 38% 12/14: 55    Time 8    Period Weeks    Status On-going    Target Date 10/20/21      PT LONG TERM GOAL #2   Title Pt will report ability to ambulate >30 minutes with AD in order to grocery shop without limitation.    Baseline Pt unable to walk prior per pt "barely to restroom).  12/14: 15 min but no longer limited by pain    Time 8    Period Weeks    Status On-going    Target Date 10/20/21      PT LONG TERM GOAL #3  Title Pt will achieve 5 full-depth squat with 0-3/10 pain in order to pick up groceries from the floor.    Baseline Pt unable to attempt squat due to fear/ pain. 12/14: 5 90 degree squats with UE support    Time 8    Period Weeks    Status On-going    Target Date 10/20/21      PT LONG TERM GOAL #4   Title Pt will achieve 5xSTS of 15 seconds or less in order to demonstrate balance for safe community ambulation.    Baseline 21 seconds 12/14: 18''    Time 8    Period Weeks    Status On-going    Target Date 10/20/21      PT LONG TERM GOAL #5   Title Pt will achieve global hip/ knee strength of 4+/5 or greater in order to progress independent LE strengthening regimen without limitation.    Baseline See flowsheet 12/14: MMT: knee flexion: L 4/5, R 5/5 knee ext: L 4/5, R5/5    Time 8    Period Weeks    Status On-going    Target Date 10/20/21                   Plan - 09/22/21 0913     Clinical Impression Statement Kestrel is progressing well with therapy.  Pt reports no increase in baseline pain following therapy.  Today we concentrated on lower extremity strengthening and knee strengthening.  Pt continues to progress strength while maintaining a low level of pain.  She is improving with transfers and ability  to ambulate.  Pt will continue to benefit from skilled physical therapy to address remaining deficits and achieve listed goals.  Continue per POC.    PT Treatment/Interventions ADLs/Self Care Home Management;Cryotherapy;Moist Heat;Gait training;Stair training;Functional mobility training;Therapeutic activities;Therapeutic exercise;Neuromuscular re-education;Patient/family education;Manual techniques;Passive range of motion;Dry needling;Taping;Aquatic Therapy;Vasopneumatic Device;Compression bandaging;Balance training    PT Next Visit Plan Progress hip/ knee closed-chain strengthening, assess response to HEP    PT Home Exercise Plan [id number]             Patient will benefit from skilled therapeutic intervention in order to improve the following deficits and impairments:  Abnormal gait, Decreased activity tolerance, Decreased balance, Decreased endurance, Decreased mobility, Decreased range of motion, Decreased strength, Difficulty walking, Pain, Impaired flexibility, Hypomobility  Visit Diagnosis: Chronic pain of right knee  Chronic pain of left knee  Muscle weakness (generalized)  Unsteadiness on feet     Problem List Patient Active Problem List   Diagnosis Date Noted   IDA (iron deficiency anemia) 04/13/2021   Vitamin D deficiency 03/02/2021   Symptomatic mammary hypertrophy 04/28/2020   Shoulder pain 04/28/2020   Back pain 04/28/2020   Impingement syndrome of left ankle 12/30/2019   Prediabetes 12/23/2019   Right ankle pain 02/16/2018    Mathis Dad, PT 09/22/2021, 9:13 AM  [facility] [address] [place], [place], [address] Phone: [phone]   Fax:  [phone]  Name: ADRYANA MOGENSEN MRN: [id number] Date of Birth: 1971-05-26

## 2021-09-23 ENCOUNTER — Other Ambulatory Visit: Payer: Self-pay

## 2021-09-23 ENCOUNTER — Ambulatory Visit: Payer: No Typology Code available for payment source | Admitting: Physical Therapy

## 2021-09-23 ENCOUNTER — Encounter: Payer: Self-pay | Admitting: Physical Therapy

## 2021-09-23 DIAGNOSIS — M25561 Pain in right knee: Secondary | ICD-10-CM | POA: Diagnosis not present

## 2021-09-23 DIAGNOSIS — G8929 Other chronic pain: Secondary | ICD-10-CM

## 2021-09-23 DIAGNOSIS — M25562 Pain in left knee: Secondary | ICD-10-CM

## 2021-09-23 DIAGNOSIS — M6281 Muscle weakness (generalized): Secondary | ICD-10-CM

## 2021-09-23 NOTE — Therapy (Signed)
Samantha Holden, [place], [address] Phone: [phone]   Fax:  [phone]  Physical Therapy Treatment  Patient Details  Name: Samantha Holden MRN: [id number] Date of Birth: Jan 10, 1971 Referring Provider (PT): Lamar Blinks, MD   Encounter Date: 09/23/2021   PT End of Session - 09/23/21 1748     Visit Number 13    Number of Visits 24    Date for PT Re-Evaluation 10/20/21   extended   Authorization Type UHC    PT Start Time 2025    PT Stop Time 1828    PT Time Calculation (min) 43 min    Activity Tolerance Patient tolerated treatment well    Behavior During Therapy [facility] for tasks assessed/performed             Past Medical History:  Diagnosis Date   Allergy    Chicken pox     Past Surgical History:  Procedure Laterality Date   DILATION AND CURETTAGE, DIAGNOSTIC / THERAPEUTIC  1992   WISDOM TOOTH EXTRACTION      There were no vitals filed for this visit.   Subjective Assessment - 09/23/21 1753     Subjective Pt reports that she is doing well overall.  She has a pinching in her anterior lateral knee today, "maybe the shot is wearing off."  She has 3/10 pain in her L knee currently.              [place] Adult PT Treatment/Exercise:   Therapeutic Exercise: - nu-step L9 66m LE only while taking subjective and planning session with patient - Slant board stretch - 30'' x3 - TKE Black TB - 3x10 - single knee ext machine - 25# - 3x10 ea - STS from 50 cm - 3x10  - Leg press - 3x10 - 85# - Hip abduction machine x 15 30# ea (NT) - Step up 8" x 10 on ea LE   In //: (5#) - hurdle walking 4 hurdles - 3x fwd, 3x lat (NT) - step up 8'' step - x15 ea fwd and lat (NT)    PT Short Term Goals - 08/25/21 1804       PT SHORT TERM GOAL #1   Title Pt will report understanding and adherence to her HEP in order to promote independence in the management of her primary impairments.    Baseline HEP  provided at eval    Time 4    Period Weeks    Status Achieved    Target Date 07/27/21               PT Long Term Goals - 08/25/21 1751       PT LONG TERM GOAL #1   Title Pt will achieve a FOTO score of 58% in order to demonstrate improved functional ability as it relates to her knee pain.    Baseline 38% 12/14: 55    Time 8    Period Weeks    Status On-going    Target Date 10/20/21      PT LONG TERM GOAL #2   Title Pt will report ability to ambulate >30 minutes with AD in order to grocery shop without limitation.    Baseline Pt unable to walk prior per pt "barely to restroom).  12/14: 15 min but no longer limited by pain    Time 8    Period Weeks    Status On-going    Target Date 10/20/21  PT LONG TERM GOAL #3   Title Pt will achieve 5 full-depth squat with 0-3/10 pain in order to pick up groceries from the floor.    Baseline Pt unable to attempt squat due to fear/ pain. 12/14: 5 90 degree squats with UE support    Time 8    Period Weeks    Status On-going    Target Date 10/20/21      PT LONG TERM GOAL #4   Title Pt will achieve 5xSTS of 15 seconds or less in order to demonstrate balance for safe community ambulation.    Baseline 21 seconds 12/14: 18''    Time 8    Period Weeks    Status On-going    Target Date 10/20/21      PT LONG TERM GOAL #5   Title Pt will achieve global hip/ knee strength of 4+/5 or greater in order to progress independent LE strengthening regimen without limitation.    Baseline See flowsheet 12/14: MMT: knee flexion: L 4/5, R 5/5 knee ext: L 4/5, R5/5    Time 8    Period Weeks    Status On-going    Target Date 10/20/21                   Plan - 09/23/21 1827     Clinical Impression Statement Samantha Holden is progressing well with therapy.  Pt reports no increase in baseline pain following therapy.  Today we concentrated on knee strengthening.  Pt continues to show improvement in LE strength.  She does have knee flexion  contractures which limit normal gait, so TKE was added today to try to maximize knee ext.  Pt will continue to benefit from skilled physical therapy to address remaining deficits and achieve listed goals.  Continue per POC.    PT Treatment/Interventions ADLs/Self Care Home Management;Cryotherapy;Moist Heat;Gait training;Stair training;Functional mobility training;Therapeutic activities;Therapeutic exercise;Neuromuscular re-education;Patient/family education;Manual techniques;Passive range of motion;Dry needling;Taping;Aquatic Therapy;Vasopneumatic Device;Compression bandaging;Balance training    PT Next Visit Plan Progress hip/ knee closed-chain strengthening, assess response to HEP    PT Home Exercise Plan [id number]             Patient will benefit from skilled therapeutic intervention in order to improve the following deficits and impairments:  Abnormal gait, Decreased activity tolerance, Decreased balance, Decreased endurance, Decreased mobility, Decreased range of motion, Decreased strength, Difficulty walking, Pain, Impaired flexibility, Hypomobility  Visit Diagnosis: Chronic pain of right knee  Chronic pain of left knee  Muscle weakness (generalized)     Problem List Patient Active Problem List   Diagnosis Date Noted   IDA (iron deficiency anemia) 04/13/2021   Vitamin D deficiency 03/02/2021   Symptomatic mammary hypertrophy 04/28/2020   Shoulder pain 04/28/2020   Back pain 04/28/2020   Impingement syndrome of left ankle 12/30/2019   Prediabetes 12/23/2019   Right ankle pain 02/16/2018    Mathis Dad, PT 09/23/2021, 6:31 PM  [place] [facility] [address] [place], [place], [address] Phone: [phone]   Fax:  [phone]  Name: Samantha Holden MRN: [id number] Date of Birth: 02-26-1971

## 2021-09-24 ENCOUNTER — Inpatient Hospital Stay: Payer: No Typology Code available for payment source

## 2021-09-24 VITALS — BP 134/83 | HR 74 | Temp 98.0°F | Resp 17

## 2021-09-24 DIAGNOSIS — D509 Iron deficiency anemia, unspecified: Secondary | ICD-10-CM | POA: Diagnosis not present

## 2021-09-24 MED ORDER — SODIUM CHLORIDE 0.9 % IV SOLN
300.0000 mg | Freq: Once | INTRAVENOUS | Status: AC
  Administered 2021-09-24: 300 mg via INTRAVENOUS
  Filled 2021-09-24: qty 300

## 2021-09-24 MED ORDER — SODIUM CHLORIDE 0.9 % IV SOLN: Freq: Once | INTRAVENOUS | Status: AC

## 2021-09-24 NOTE — Patient Instructions (Signed)

## 2021-09-28 ENCOUNTER — Ambulatory Visit: Payer: No Typology Code available for payment source | Admitting: Physical Therapy

## 2021-09-28 ENCOUNTER — Encounter: Payer: Self-pay | Admitting: Physical Therapy

## 2021-09-28 ENCOUNTER — Other Ambulatory Visit: Payer: Self-pay

## 2021-09-28 DIAGNOSIS — M25562 Pain in left knee: Secondary | ICD-10-CM

## 2021-09-28 DIAGNOSIS — M25561 Pain in right knee: Secondary | ICD-10-CM | POA: Diagnosis not present

## 2021-09-28 DIAGNOSIS — G8929 Other chronic pain: Secondary | ICD-10-CM

## 2021-09-28 DIAGNOSIS — R2681 Unsteadiness on feet: Secondary | ICD-10-CM

## 2021-09-28 DIAGNOSIS — M6281 Muscle weakness (generalized): Secondary | ICD-10-CM

## 2021-09-28 NOTE — Therapy (Signed)
[place], [place], [address] Phone: [phone]   Fax:  [phone]  Physical Therapy Treatment  Patient Details  Name: Samantha Holden MRN: [id number] Date of Birth: September 04, 1971 Referring Provider (PT): Lamar Blinks, MD   Encounter Date: 09/28/2021   PT End of Session - 09/28/21 1733     Visit Number 14    Number of Visits 24    Date for PT Re-Evaluation 10/20/21   extended   Authorization Type UHC    PT Start Time 1733    PT Stop Time 1815    PT Time Calculation (min) 42 min    Activity Tolerance Patient tolerated treatment well    Behavior During Therapy [facility] for tasks assessed/performed             Past Medical History:  Diagnosis Date   Allergy    Chicken pox     Past Surgical History:  Procedure Laterality Date   DILATION AND CURETTAGE, DIAGNOSTIC / THERAPEUTIC  1992   WISDOM TOOTH EXTRACTION      There were no vitals filed for this visit.   [place] [facility] Adult PT Treatment/Exercise:   Therapeutic Exercise: - nu-step L9 36m LE only while taking subjective and planning session with patient - Slant board stretch - 66'' x3 - TKE Black TB - 3x10 - single knee ext machine - 25# - 3x10 ea - STS from 50 cm - 2x10 (correct glute compensation) - Leg press - x10 @ 75#,  2x10 - 95# - lateral walking with RTB - 4 laps - Step up 6" 2 x 10 on ea LE   (May add in balance next visit)   PT Short Term Goals - 08/25/21 1804       PT SHORT TERM GOAL #1   Title Pt will report understanding and adherence to her HEP in order to promote independence in the management of her primary impairments.    Baseline HEP provided at eval    Time 4    Period Weeks    Status Achieved    Target Date 07/27/21               PT Long Term Goals - 08/25/21 1751       PT LONG TERM GOAL #1   Title Pt will achieve a FOTO score of 58% in order to demonstrate improved functional ability as it relates to her  knee pain.    Baseline 38% 12/14: 55    Time 8    Period Weeks    Status On-going    Target Date 10/20/21      PT LONG TERM GOAL #2   Title Pt will report ability to ambulate >30 minutes with AD in order to grocery shop without limitation.    Baseline Pt unable to walk prior per pt "barely to restroom).  12/14: 15 min but no longer limited by pain    Time 8    Period Weeks    Status On-going    Target Date 10/20/21      PT LONG TERM GOAL #3   Title Pt will achieve 5 full-depth squat with 0-3/10 pain in order to pick up groceries from the floor.    Baseline Pt unable to attempt squat due to fear/ pain. 12/14: 5 90 degree squats with UE support    Time 8    Period Weeks    Status On-going    Target Date 10/20/21  PT LONG TERM GOAL #4   Title Pt will achieve 5xSTS of 15 seconds or less in order to demonstrate balance for safe community ambulation.    Baseline 21 seconds 12/14: 18''    Time 8    Period Weeks    Status On-going    Target Date 10/20/21      PT LONG TERM GOAL #5   Title Pt will achieve global hip/ knee strength of 4+/5 or greater in order to progress independent LE strengthening regimen without limitation.    Baseline See flowsheet 12/14: MMT: knee flexion: L 4/5, R 5/5 knee ext: L 4/5, R5/5    Time 8    Period Weeks    Status On-going    Target Date 10/20/21                   Plan - 09/28/21 1800     Clinical Impression Statement Samantha Holden is progressing well with therapy.  Pt reports no increase in baseline pain following therapy.  Today we concentrated on quad strengthening and hip strengthening.  Pt continues to do well with therapy with close to 0/10 starting today.  She is completing knee ext with reduced pain and effort at this point.  Pt will continue to benefit from skilled physical therapy to address remaining deficits and achieve listed goals.  Continue per POC.    PT Treatment/Interventions ADLs/Self Care Home Management;Cryotherapy;Moist  Heat;Gait training;Stair training;Functional mobility training;Therapeutic activities;Therapeutic exercise;Neuromuscular re-education;Patient/family education;Manual techniques;Passive range of motion;Dry needling;Taping;Aquatic Therapy;Vasopneumatic Device;Compression bandaging;Balance training    PT Next Visit Plan Progress hip/ knee closed-chain strengthening, assess response to HEP    PT Home Exercise Plan [id number]             Patient will benefit from skilled therapeutic intervention in order to improve the following deficits and impairments:  Abnormal gait, Decreased activity tolerance, Decreased balance, Decreased endurance, Decreased mobility, Decreased range of motion, Decreased strength, Difficulty walking, Pain, Impaired flexibility, Hypomobility  Visit Diagnosis: Chronic pain of right knee  Chronic pain of left knee  Muscle weakness (generalized)  Unsteadiness on feet     Problem List Patient Active Problem List   Diagnosis Date Noted   IDA (iron deficiency anemia) 04/13/2021   Vitamin D deficiency 03/02/2021   Symptomatic mammary hypertrophy 04/28/2020   Shoulder pain 04/28/2020   Back pain 04/28/2020   Impingement syndrome of left ankle 12/30/2019   Prediabetes 12/23/2019   Right ankle pain 02/16/2018    Mathis Dad, PT 09/28/2021, 6:16 PM  [facility] [address] [place], [place], [address] Phone: [phone]   Fax:  [phone]  Name: Samantha Holden MRN: [id number] Date of Birth: November 13, 1970

## 2021-09-30 ENCOUNTER — Ambulatory Visit: Payer: No Typology Code available for payment source | Admitting: Physical Therapy

## 2021-10-05 ENCOUNTER — Other Ambulatory Visit: Payer: Self-pay

## 2021-10-05 ENCOUNTER — Ambulatory Visit: Payer: No Typology Code available for payment source | Admitting: Physical Therapy

## 2021-10-05 ENCOUNTER — Encounter: Payer: Self-pay | Admitting: Physical Therapy

## 2021-10-05 DIAGNOSIS — G8929 Other chronic pain: Secondary | ICD-10-CM

## 2021-10-05 DIAGNOSIS — M25561 Pain in right knee: Secondary | ICD-10-CM | POA: Diagnosis not present

## 2021-10-05 DIAGNOSIS — M25562 Pain in left knee: Secondary | ICD-10-CM

## 2021-10-05 DIAGNOSIS — M6281 Muscle weakness (generalized): Secondary | ICD-10-CM

## 2021-10-05 DIAGNOSIS — R2681 Unsteadiness on feet: Secondary | ICD-10-CM

## 2021-10-05 NOTE — Therapy (Signed)
[place], [place], [address] Phone: [phone]   Fax:  [phone]  Physical Therapy Treatment  Patient Details  Name: Samantha Holden MRN: [id number] Date of Birth: December 14, 1970 Referring Provider (PT): Lamar Blinks, MD   Encounter Date: 10/05/2021   PT End of Session - 10/05/21 1746     Visit Number 15    Number of Visits 24    Date for PT Re-Evaluation 10/20/21   extended   Authorization Type UHC    PT Start Time 6269    PT Stop Time 1829    PT Time Calculation (min) 43 min    Activity Tolerance Patient tolerated treatment well    Behavior During Therapy [facility] for tasks assessed/performed             Past Medical History:  Diagnosis Date   Allergy    Chicken pox     Past Surgical History:  Procedure Laterality Date   DILATION AND CURETTAGE, DIAGNOSTIC / THERAPEUTIC  1992   WISDOM TOOTH EXTRACTION      There were no vitals filed for this visit.   [place] Adult PT Treatment/Exercise:   Therapeutic Exercise: - nu-step L10 60m LE only while taking subjective and planning session with patient - Slant board stretch - 64'' x3 - TKE Black TB - 3x10 (NT) - single knee ext machine - 25# - 3x10 ea - STS from blue bench + airex - 2x10 (correct glute compensation) - Leg press - x10 @ 75#,  2x10 - 100# - lateral walking with RTB - 4 laps - Step up 6" 2 x 10 on ea LE   (May add in balance next visit)    PT Short Term Goals - 08/25/21 1804       PT SHORT TERM GOAL #1   Title Pt will report understanding and adherence to her HEP in order to promote independence in the management of her primary impairments.    Baseline HEP provided at eval    Time 4    Period Weeks    Status Achieved    Target Date 07/27/21               PT Long Term Goals - 08/25/21 1751       PT LONG TERM GOAL #1   Title Pt will achieve a FOTO score of 58% in order to demonstrate improved functional ability as  it relates to her knee pain.    Baseline 38% 12/14: 55    Time 8    Period Weeks    Status On-going    Target Date 10/20/21      PT LONG TERM GOAL #2   Title Pt will report ability to ambulate >30 minutes with AD in order to grocery shop without limitation.    Baseline Pt unable to walk prior per pt "barely to restroom).  12/14: 15 min but no longer limited by pain    Time 8    Period Weeks    Status On-going    Target Date 10/20/21      PT LONG TERM GOAL #3   Title Pt will achieve 5 full-depth squat with 0-3/10 pain in order to pick up groceries from the floor.    Baseline Pt unable to attempt squat due to fear/ pain. 12/14: 5 90 degree squats with UE support    Time 8    Period Weeks    Status On-going    Target Date 10/20/21  PT LONG TERM GOAL #4   Title Pt will achieve 5xSTS of 15 seconds or less in order to demonstrate balance for safe community ambulation.    Baseline 21 seconds 12/14: 18''    Time 8    Period Weeks    Status On-going    Target Date 10/20/21      PT LONG TERM GOAL #5   Title Pt will achieve global hip/ knee strength of 4+/5 or greater in order to progress independent LE strengthening regimen without limitation.    Baseline See flowsheet 12/14: MMT: knee flexion: L 4/5, R 5/5 knee ext: L 4/5, R5/5    Time 8    Period Weeks    Status On-going    Target Date 10/20/21                   Plan - 10/05/21 1826     Clinical Impression Statement Samantha Holden is progressing well with therapy.  Pt reports no increase in baseline pain following therapy.  Today we concentrated on knee strengthening and hip strengthening.  Pt able to progress to low blue bench + airex today showing improved functional strength in transfers.  We will plan on D/C next visit with HEP until her surgery in March.  Pt will continue to benefit from skilled physical therapy to address remaining deficits and achieve listed goals.  Continue per POC.    PT Treatment/Interventions  ADLs/Self Care Home Management;Cryotherapy;Moist Heat;Gait training;Stair training;Functional mobility training;Therapeutic activities;Therapeutic exercise;Neuromuscular re-education;Patient/family education;Manual techniques;Passive range of motion;Dry needling;Taping;Aquatic Therapy;Vasopneumatic Device;Compression bandaging;Balance training    PT Next Visit Plan Progress hip/ knee closed-chain strengthening, assess response to HEP    PT Home Exercise Plan [id number]              Patient will benefit from skilled therapeutic intervention in order to improve the following deficits and impairments:  Abnormal gait, Decreased activity tolerance, Decreased balance, Decreased endurance, Decreased mobility, Decreased range of motion, Decreased strength, Difficulty walking, Pain, Impaired flexibility, Hypomobility  Visit Diagnosis: Chronic pain of right knee  Chronic pain of left knee  Muscle weakness (generalized)  Unsteadiness on feet     Problem List Patient Active Problem List   Diagnosis Date Noted   IDA (iron deficiency anemia) 04/13/2021   Vitamin D deficiency 03/02/2021   Symptomatic mammary hypertrophy 04/28/2020   Shoulder pain 04/28/2020   Back pain 04/28/2020   Impingement syndrome of left ankle 12/30/2019   Prediabetes 12/23/2019   Right ankle pain 02/16/2018    Mathis Dad, PT 10/05/2021, 6:28 PM  [facility] [address] [place], [place], [address] Phone: [phone]   Fax:  [phone]  Name: Samantha Holden MRN: [id number] Date of Birth: September 23, 1970

## 2021-10-07 ENCOUNTER — Ambulatory Visit: Payer: No Typology Code available for payment source | Admitting: Physical Therapy

## 2021-10-08 ENCOUNTER — Encounter: Payer: Self-pay | Admitting: Family Medicine

## 2021-10-12 ENCOUNTER — Other Ambulatory Visit: Payer: Self-pay

## 2021-10-12 ENCOUNTER — Encounter: Payer: Self-pay | Admitting: Physical Therapy

## 2021-10-12 ENCOUNTER — Ambulatory Visit: Payer: No Typology Code available for payment source | Admitting: Physical Therapy

## 2021-10-12 DIAGNOSIS — G8929 Other chronic pain: Secondary | ICD-10-CM

## 2021-10-12 DIAGNOSIS — R2681 Unsteadiness on feet: Secondary | ICD-10-CM

## 2021-10-12 DIAGNOSIS — M25561 Pain in right knee: Secondary | ICD-10-CM | POA: Diagnosis not present

## 2021-10-12 DIAGNOSIS — M6281 Muscle weakness (generalized): Secondary | ICD-10-CM

## 2021-10-12 NOTE — Therapy (Signed)
[place], [place], [address] Phone: [phone]   Fax:  [phone]  PHYSICAL THERAPY DISCHARGE SUMMARY  Visits from Start of Care: 16  Current functional level related to goals / functional outcomes: See assessment/goals   Remaining deficits: See assessment/goals   Education / Equipment: HEP and D/C plans  Patient agrees to discharge. Patient goals were partially met. Patient is being discharged due to maximized rehab potential.   Patient Details  Name: Samantha Holden MRN: [id number] Date of Birth: 10-Jan-1971 Referring Provider (PT): Lamar Blinks, MD   Encounter Date: 10/12/2021   PT End of Session - 10/12/21 1740     Visit Number 16    Number of Visits 24    Date for PT Re-Evaluation 10/20/21   extended   Authorization Type UHC    PT Start Time 8416    PT Stop Time 1828    PT Time Calculation (min) 43 min    Activity Tolerance Patient tolerated treatment well    Behavior During Therapy [facility] for tasks assessed/performed             Past Medical History:  Diagnosis Date   Allergy    Chicken pox     Past Surgical History:  Procedure Laterality Date   DILATION AND CURETTAGE, DIAGNOSTIC / THERAPEUTIC  1992   WISDOM TOOTH EXTRACTION      There were no vitals filed for this visit. Objective:   FOTO: 43  5x STS:  MMT: knee flexion: L 4/5, R 5/5 knee ext: L 4/5, R5/5   [facility] Adult PT Treatment/Exercise:   Therapeutic Exercise: - nu-step L10 10mLE only while taking subjective and planning session with patient - Slant board stretch - 460' x3 - TKE Black TB - 3x10 (NT) - single knee ext machine - 25# - 3x10 ea - STS from blue bench + airex - 3x10 (correct glute compensation) - Leg press - x10 @ 75#,  2x10 - 100# - Step up 6" 2 x 10 on ea LE  Therapeutic Activity - collecting information for goals, checking progress, and reviewing with patient      PT Short Term Goals -  08/25/21 1804       PT SHORT TERM GOAL #1   Title Pt will report understanding and adherence to her HEP in order to promote independence in the management of her primary impairments.    Baseline HEP provided at eval    Time 4    Period Weeks    Status Achieved    Target Date 07/27/21               PT Long Term Goals - 10/12/21 1751       PT LONG TERM GOAL #1   Title Pt will achieve a FOTO score of 58% in order to demonstrate improved functional ability as it relates to her knee pain.    Baseline 38% 12/14: 55    Time 8    Period Weeks    Status Partially Met    Target Date 10/20/21      PT LONG TERM GOAL #2   Title Pt will report ability to ambulate >30 minutes with AD in order to grocery shop without limitation.    Baseline Pt unable to walk prior per pt "barely to restroom).  12/14: 15 min but no longer limited by pain 1/31: 15 min    Time 8    Period Weeks    Status  Partially Met    Target Date 10/20/21      PT LONG TERM GOAL #3   Title Pt will achieve 5 full-depth squat with 0-3/10 pain in order to pick up groceries from the floor.    Baseline Pt unable to attempt squat due to fear/ pain. 12/14: 5 90 degree squats with UE support 1/31: 10 UE squats with UE support    Time 8    Period Weeks    Status Partially Met    Target Date 10/20/21      PT LONG TERM GOAL #4   Title Pt will achieve 5xSTS of 15 seconds or less in order to demonstrate balance for safe community ambulation.    Baseline 21 seconds 12/14: 18'' 1/31: 16''    Time 8    Period Weeks    Status Partially Met    Target Date 10/20/21      PT LONG TERM GOAL #5   Title Pt will achieve global hip/ knee strength of 4+/5 or greater in order to progress independent LE strengthening regimen without limitation.    Baseline See flowsheet 12/14: MMT: knee flexion: L 4/5, R 5/5 knee ext: L 4/5, R5/5 1/31: MMT: knee flexion: L 4/5, R 5/5 knee ext: L 4/5, R5/5    Time 8    Period Weeks    Status Partially Met     Target Date 10/20/21                   Plan - 10/12/21 1803     Clinical Impression Statement Samantha Holden has progressed well with therapy overall.  Improved impairments include: LE strength, activity tolerance, walking tolerance.  Functional improvements include: ability to ambulate short distances with SPC, improved tranfers, improved ability to complete ADLs.  Progressions needed include: continued work at home with HEP.  Barriers to progress include: OA bil knees L>R.  Samantha Holden has progressed well overall, but has made minimal progress since last progress note.  She is scheduled for TKA at the end of March.  She will continue her HEP until this time.  Please see GOALS section for progress on short term and long term goals established at evaluation.  I recommend D/C home with HEP; pt agrees with plan.               Patient will benefit from skilled therapeutic intervention in order to improve the following deficits and impairments:     Visit Diagnosis: Chronic pain of right knee  Chronic pain of left knee  Muscle weakness (generalized)  Unsteadiness on feet     Problem List Patient Active Problem List   Diagnosis Date Noted   IDA (iron deficiency anemia) 04/13/2021   Vitamin D deficiency 03/02/2021   Symptomatic mammary hypertrophy 04/28/2020   Shoulder pain 04/28/2020   Back pain 04/28/2020   Impingement syndrome of left ankle 12/30/2019   Prediabetes 12/23/2019   Right ankle pain 02/16/2018    Mathis Dad, PT 10/12/2021, 6:31 PM  [facility] [address] [place], [place], [address] Phone: [phone]   Fax:  [phone]  Name: Samantha Holden MRN: [id number] Date of Birth: 1971-06-01

## 2021-10-14 ENCOUNTER — Ambulatory Visit: Payer: No Typology Code available for payment source | Admitting: Physical Therapy

## 2021-10-20 ENCOUNTER — Telehealth: Payer: Self-pay

## 2021-10-20 NOTE — Telephone Encounter (Signed)
Called pt to let her know she needed an appointment to have her preOp form filled out, no one answered so I left a VM.   Please schedule if pt calls back. Okay to use a 10 am slot if needed per JC.

## 2021-10-26 NOTE — Patient Instructions (Addendum)
It was good to see you again today- I think if your labs are ok we should be able to get you cleared for your operation!    Be careful not to gain weight- if your BMI goes over 40 they cannot do your surgery  Take care!   I would also suggest getting a covid booster and the shingles vaccine at your convenience

## 2021-10-26 NOTE — Progress Notes (Addendum)
[facility] [address] [place], [place] [address] [phone]  Date:  10/27/2021   Name:  Samantha Holden   DOB:  October 16, 1970   MRN:  [id number]  PCP:  Darreld Mclean, MD    Chief Complaint: Pre-op Exam (Concerns/ questions:  wonders if her iron and b12 are at a good level since her infusions)   History of Present Illness:  Samantha Holden is a 51 y.o. very pleasant female patient who presents with the following:  Patient seen today for preoperative clearance History of prediabetes, vitamin D and iron deficiency, significant obesity Most recent visit with myself was in June for physical She is also being followed by hematology for iron deficiency anemia  Her BMI is under 40.  Reminded that she needs to keep her BMI under 40 to qualify for surgery, she will certainly try  Never a smoker  CMP on chart from August, most recent CBC in December Her knee significantly limits her exercise-she has great difficulty walking or using stairs.  She is brought back to a room in a wheelchair today However she can do cardio at her PT- recumbent bike and recumbent stepper- no CP or SOB with this exercise Patient Active Problem List   Diagnosis Date Noted   IDA (iron deficiency anemia) 04/13/2021   Vitamin D deficiency 03/02/2021   Symptomatic mammary hypertrophy 04/28/2020   Shoulder pain 04/28/2020   Back pain 04/28/2020   Impingement syndrome of left ankle 12/30/2019   Prediabetes 12/23/2019   Right ankle pain 02/16/2018    Past Medical History:  Diagnosis Date   Allergy    Chicken pox     Past Surgical History:  Procedure Laterality Date   DILATION AND CURETTAGE, DIAGNOSTIC / THERAPEUTIC  1992   WISDOM TOOTH EXTRACTION      Social History   Tobacco Use   Smoking status: Never   Smokeless tobacco: Never  Vaping Use   Vaping Use: Never used  Substance Use Topics   Alcohol use: No   Drug use: Never     Family History  Problem Relation Age of Onset   Breast cancer Mother    Arthritis Mother    Hypertension Mother    Colon polyps Mother    Cancer Father    Bladder Cancer Father    Heart attack Father    Arthritis Maternal Grandmother    Heart disease Maternal Grandfather    Arthritis Maternal Grandfather    Heart attack Maternal Grandfather    Hypertension Maternal Grandfather    Colon cancer Neg Hx    Esophageal cancer Neg Hx    Stomach cancer Neg Hx    Rectal cancer Neg Hx     No Known Allergies  Medication list has been reviewed and updated.  Current Outpatient Medications on File Prior to Visit  Medication Sig Dispense Refill   Acetaminophen (TYLENOL ARTHRITIS EXT RELIEF PO) Tylenol Arthritis     Cholecalciferol (VITAMIN D3) 1.25 MG (50000 UT) CAPS TAKE 1 WEEKLY FOR 12 WEEKS 12 capsule 1   diclofenac (VOLTAREN) 75 MG EC tablet diclofenac sodium 75 mg tablet,delayed release  TAKE 1 TABLET TWICE A DAY BY ORAL ROUTE BEFORE MEALS.     folic acid (FOLVITE) 1 MG tablet Take 1 tablet (1 mg total) by mouth daily. 30 tablet 11   No current facility-administered medications on file prior to visit.    Review of Systems:  As  per HPI- otherwise negative. Wt Readings from Last 3 Encounters:  10/27/21 291 lb 12.8 oz (132.4 kg)  09/07/21 297 lb 12.8 oz (135.1 kg)  06/07/21 275 lb 12.8 oz (125.1 kg)      Physical Examination: Vitals:   10/27/21 1448  BP: 118/72  Pulse: (!) 102  Resp: 18  SpO2: 98%   Vitals:   10/27/21 1448  Weight: 291 lb 12.8 oz (132.4 kg)  Height: 6' (1.829 m)   Body mass index is 39.58 kg/m. Ideal Body Weight: Weight in (lb) to have BMI = 25: 183.9  GEN: no acute distress.  Obese, looks well HEENT: Atraumatic, Normocephalic.  Ears and Nose: No external deformity. CV: RRR, No M/G/R. No JVD. No thrill. No extra heart sounds. PULM: CTA B, no wheezes, crackles, rhonchi. No retractions. No resp. distress. No accessory muscle use. ABD: S, NT,  ND, +BS. No rebound. No HSM. EXTR: No c/c/e PSYCH: Normally interactive. Conversant.   EKG: NSR, rate 85 Assessment and Plan: Preoperative cardiovascular examination - Plan: EKG 12-Lead, Protime-INR, POCT urinalysis dipstick  Anemia, unspecified type - Plan: CBC, Ferritin  Pre-diabetes - Plan: Comprehensive metabolic panel, Hemoglobin A1c  Vitamin D deficiency - Plan: VITAMIN D 25 Hydroxy (Vit-D Deficiency, Fractures)  Immunization due - Plan: Varicella-zoster vaccine IM (Shingrix)  Patient seen today for preoperative evaluation.  Looks good except for her poor exercise tolerance due to orthopedic condition.  Assuming her labs are normal we should be able to clear her for surgery Advised her BMI must remain under 40 for surgery  Given second dose of Shingrix today  Will plan further follow- up pending labs.   Signed Lamar Blinks, MD  Received labs 2/16- message to pt  Addnd 2/23- completed and faxed operative clearance form today   Results for orders placed or performed in visit on 10/27/21  CBC  Result Value Ref Range   WBC 4.4 4.0 - 10.5 K/uL   RBC 4.70 3.87 - 5.11 Mil/uL   Platelets 281.0 150.0 - 400.0 K/uL   Hemoglobin 11.9 (L) 12.0 - 15.0 g/dL   HCT 38.1 36.0 - 46.0 %   MCV 81.0 78.0 - 100.0 fl   MCHC 31.3 30.0 - 36.0 g/dL   RDW 16.2 (H) 11.5 - 15.5 %  Comprehensive metabolic panel  Result Value Ref Range   Sodium 139 135 - 145 mEq/L   Potassium 4.2 3.5 - 5.1 mEq/L   Chloride 103 96 - 112 mEq/L   CO2 32 19 - 32 mEq/L   Glucose, Bld 100 (H) 70 - 99 mg/dL   BUN 18 6 - 23 mg/dL   Creatinine, Ser 0.83 0.40 - 1.20 mg/dL   Total Bilirubin 0.5 0.2 - 1.2 mg/dL   Alkaline Phosphatase 108 39 - 117 U/L   AST 14 0 - 37 U/L   ALT 8 0 - 35 U/L   Total Protein 7.7 6.0 - 8.3 g/dL   Albumin 3.9 3.5 - 5.2 g/dL   GFR 81.34 >60.00 mL/min   Calcium 9.5 8.4 - 10.5 mg/dL  Hemoglobin A1c  Result Value Ref Range   Hgb A1c MFr Bld 6.3 4.6 - 6.5 %  VITAMIN D 25 Hydroxy  (Vit-D Deficiency, Fractures)  Result Value Ref Range   VITD 51.73 30.00 - 100.00 ng/mL  Ferritin  Result Value Ref Range   Ferritin 176.7 10.0 - 291.0 ng/mL  POCT urinalysis dipstick  Result Value Ref Range   Color, UA yellow yellow   Clarity, UA cloudy (A) clear  Glucose, UA negative negative mg/dL   Bilirubin, UA small (A) negative   Ketones, POC UA negative negative mg/dL   Spec Grav, UA 1.020 1.010 - 1.025   Blood, UA negative negative   pH, UA 6.0 5.0 - 8.0   Protein Ur, POC negative negative mg/dL   Urobilinogen, UA 0.2 0.2 or 1.0 E.U./dL   Nitrite, UA Negative Negative   Leukocytes, UA Negative Negative

## 2021-10-27 ENCOUNTER — Ambulatory Visit: Payer: No Typology Code available for payment source | Admitting: Family Medicine

## 2021-10-27 VITALS — BP 118/72 | HR 102 | Resp 18 | Ht 72.0 in | Wt 291.8 lb

## 2021-10-27 DIAGNOSIS — E559 Vitamin D deficiency, unspecified: Secondary | ICD-10-CM | POA: Diagnosis not present

## 2021-10-27 DIAGNOSIS — R7303 Prediabetes: Secondary | ICD-10-CM | POA: Diagnosis not present

## 2021-10-27 DIAGNOSIS — Z01810 Encounter for preprocedural cardiovascular examination: Secondary | ICD-10-CM | POA: Diagnosis not present

## 2021-10-27 DIAGNOSIS — Z23 Encounter for immunization: Secondary | ICD-10-CM | POA: Diagnosis not present

## 2021-10-27 DIAGNOSIS — D649 Anemia, unspecified: Secondary | ICD-10-CM | POA: Diagnosis not present

## 2021-10-27 LAB — POCT URINALYSIS DIP (MANUAL ENTRY)
Blood, UA: NEGATIVE
Glucose, UA: NEGATIVE mg/dL
Ketones, POC UA: NEGATIVE mg/dL
Leukocytes, UA: NEGATIVE
Nitrite, UA: NEGATIVE
Protein Ur, POC: NEGATIVE mg/dL
Spec Grav, UA: 1.02 (ref 1.010–1.025)
Urobilinogen, UA: 0.2 E.U./dL
pH, UA: 6 (ref 5.0–8.0)

## 2021-10-28 ENCOUNTER — Encounter: Payer: Self-pay | Admitting: Family Medicine

## 2021-10-28 LAB — CBC
HCT: 38.1 % (ref 36.0–46.0)
Hemoglobin: 11.9 g/dL — ABNORMAL LOW (ref 12.0–15.0)
MCHC: 31.3 g/dL (ref 30.0–36.0)
MCV: 81 fl (ref 78.0–100.0)
Platelets: 281 10*3/uL (ref 150.0–400.0)
RBC: 4.7 Mil/uL (ref 3.87–5.11)
RDW: 16.2 % — ABNORMAL HIGH (ref 11.5–15.5)
WBC: 4.4 10*3/uL (ref 4.0–10.5)

## 2021-10-28 LAB — COMPREHENSIVE METABOLIC PANEL
ALT: 8 U/L (ref 0–35)
AST: 14 U/L (ref 0–37)
Albumin: 3.9 g/dL (ref 3.5–5.2)
Alkaline Phosphatase: 108 U/L (ref 39–117)
BUN: 18 mg/dL (ref 6–23)
CO2: 32 mEq/L (ref 19–32)
Calcium: 9.5 mg/dL (ref 8.4–10.5)
Chloride: 103 mEq/L (ref 96–112)
Creatinine, Ser: 0.83 mg/dL (ref 0.40–1.20)
GFR: 81.34 mL/min (ref >60.00)
Glucose, Bld: 100 mg/dL — ABNORMAL HIGH (ref 70–99)
Potassium: 4.2 mEq/L (ref 3.5–5.1)
Sodium: 139 mEq/L (ref 135–145)
Total Bilirubin: 0.5 mg/dL (ref 0.2–1.2)
Total Protein: 7.7 g/dL (ref 6.0–8.3)

## 2021-10-28 LAB — HEMOGLOBIN A1C: Hgb A1c MFr Bld: 6.3 % (ref 4.6–6.5)

## 2021-10-28 LAB — VITAMIN D 25 HYDROXY (VIT D DEFICIENCY, FRACTURES): VITD: 51.73 ng/mL (ref 30.00–100.00)

## 2021-10-28 LAB — FERRITIN: Ferritin: 176.7 ng/mL (ref 10.0–291.0)

## 2021-11-12 ENCOUNTER — Encounter: Payer: Self-pay | Admitting: Family Medicine

## 2021-11-12 ENCOUNTER — Other Ambulatory Visit: Payer: Self-pay | Admitting: Family Medicine

## 2021-11-12 DIAGNOSIS — E559 Vitamin D deficiency, unspecified: Secondary | ICD-10-CM

## 2021-11-12 NOTE — Telephone Encounter (Signed)
Would you like Pt to continue Ergocalciferol?  

## 2021-12-06 ENCOUNTER — Inpatient Hospital Stay: Payer: No Typology Code available for payment source | Admitting: Family

## 2021-12-06 ENCOUNTER — Inpatient Hospital Stay: Payer: No Typology Code available for payment source | Attending: Hematology & Oncology

## 2022-04-16 ENCOUNTER — Other Ambulatory Visit: Payer: Self-pay | Admitting: Family

## 2022-04-16 DIAGNOSIS — D563 Thalassemia minor: Secondary | ICD-10-CM

## 2022-04-18 ENCOUNTER — Encounter: Payer: Self-pay | Admitting: Family

## 2022-04-20 ENCOUNTER — Other Ambulatory Visit: Payer: Self-pay | Admitting: Podiatry

## 2022-12-26 ENCOUNTER — Encounter: Payer: Self-pay | Admitting: *Deleted

## 2023-03-06 ENCOUNTER — Encounter: Payer: Self-pay | Admitting: Family Medicine

## 2023-03-21 ENCOUNTER — Telehealth: Payer: Self-pay | Admitting: Family Medicine

## 2023-03-21 NOTE — Telephone Encounter (Signed)
Pt dropped off paperwork for a disability license plate. Pt would like to be called at the number on the form when completed.

## 2023-03-22 NOTE — Telephone Encounter (Signed)
Message sent to pts MyChart letting  both she and Kennyth Arnold know that Dr Patsy Lager is on vacay- but will sing on her return.

## 2023-03-23 DIAGNOSIS — Z0279 Encounter for issue of other medical certificate: Secondary | ICD-10-CM

## 2023-03-23 NOTE — Telephone Encounter (Signed)
Forms have been placed up front for pick up.   

## 2023-04-25 ENCOUNTER — Other Ambulatory Visit: Payer: Self-pay | Admitting: Hematology & Oncology

## 2023-04-25 DIAGNOSIS — D563 Thalassemia minor: Secondary | ICD-10-CM

## 2023-07-11 LAB — HM MAMMOGRAPHY

## 2023-07-12 ENCOUNTER — Encounter: Payer: Self-pay | Admitting: Family Medicine

## 2023-10-20 ENCOUNTER — Encounter: Payer: Self-pay | Admitting: Family

## 2024-04-30 ENCOUNTER — Other Ambulatory Visit: Payer: Self-pay | Admitting: Hematology & Oncology

## 2024-04-30 DIAGNOSIS — D563 Thalassemia minor: Secondary | ICD-10-CM

## 2024-05-27 ENCOUNTER — Encounter: Payer: Self-pay | Admitting: Family

## 2024-07-11 LAB — HM MAMMOGRAPHY

## 2024-08-10 ENCOUNTER — Other Ambulatory Visit: Payer: Self-pay

## 2024-08-10 ENCOUNTER — Encounter: Payer: Self-pay | Admitting: Family

## 2024-08-10 ENCOUNTER — Emergency Department (HOSPITAL_BASED_OUTPATIENT_CLINIC_OR_DEPARTMENT_OTHER)
Admission: EM | Admit: 2024-08-10 | Discharge: 2024-08-10 | Disposition: A | Discharge status: Home / Self Care | Attending: Emergency Medicine | Admitting: Emergency Medicine

## 2024-08-10 ENCOUNTER — Emergency Department (HOSPITAL_BASED_OUTPATIENT_CLINIC_OR_DEPARTMENT_OTHER)

## 2024-08-10 ENCOUNTER — Encounter (HOSPITAL_BASED_OUTPATIENT_CLINIC_OR_DEPARTMENT_OTHER): Payer: Self-pay

## 2024-08-10 DIAGNOSIS — M25561 Pain in right knee: Secondary | ICD-10-CM | POA: Diagnosis present

## 2024-08-10 DIAGNOSIS — X509XXA Other and unspecified overexertion or strenuous movements or postures, initial encounter: Secondary | ICD-10-CM | POA: Diagnosis not present

## 2024-08-10 MED ORDER — TRAMADOL HCL 50 MG PO TABS
50.0000 mg | ORAL_TABLET | Freq: Once | ORAL | Status: AC
  Administered 2024-08-10: 50 mg via ORAL
  Filled 2024-08-10: qty 1

## 2024-08-10 MED ORDER — NAPROXEN 500 MG PO TABS: 500.0000 mg | ORAL_TABLET | Freq: Two times a day (BID) | ORAL | 0 refills | Status: AC

## 2024-08-10 NOTE — ED Notes (Signed)
 Patient transported to X-ray

## 2024-08-10 NOTE — Discharge Instructions (Signed)
 We discussed the results of your x-ray on today's visit.  You are prescribed a short course of anti-inflammatories to help with your pain, please take 1 tablet twice a day for the next 7 days.  You were also given a referral for orthopedics, please follow-up with them at your convenience.

## 2024-08-10 NOTE — ED Triage Notes (Signed)
 States R knee buckled on her last Thursday. Reports swelling and pain since.

## 2024-08-10 NOTE — ED Provider Notes (Signed)
 [facility] Provider Note   CSN: [id number] Arrival date & time: 08/10/24  1653     Patient presents with: Knee Pain   Samantha Holden is a 53 y.o. female.   53 year old female with no past medical history presents to the ED with a chief complaint of right knee pain which began today, she reports trying to ambulate when suddenly her knee buckled.  She tells me that this actually occurred originally on Thursday however now has worsened.  She did not feel any popping sensation.  She has not had trouble with her knees, although she was previously diagnosed with rheumatoid arthritis.  She has tried taking some over-the-counter medication without much improvement in symptoms.  Pain is worsened with any weightbearing along with any type of flexion.  Denies any injury, fever, weakness or numbness.   The history is provided by the patient.  Knee Pain Location:  Knee Time since incident:  4 days Injury: no   Knee location:  R knee Associated symptoms: no fever        Prior to Admission medications   Medication Sig Start Date End Date Taking? Authorizing Provider  naproxen  (NAPROSYN ) 500 MG tablet Take 1 tablet (500 mg total) by mouth 2 (two) times daily for 7 days. 08/10/24 08/17/24 Yes Luticia Tadros, PA-C  Acetaminophen  (TYLENOL  ARTHRITIS EXT RELIEF PO) Tylenol  Arthritis    [provider]  Cholecalciferol (VITAMIN D3) 1.25 MG (50000 UT) CAPS TAKE 1 WEEKLY FOR 12 WEEKS 05/24/21   Copland, Harlene BROCKS, MD  folic acid  (FOLVITE ) 1 MG tablet TAKE 1 TABLET BY MOUTH EVERY DAY 04/30/24   Timmy Maude SAUNDERS, MD    Allergies: Patient has no known allergies.    Review of Systems  Constitutional:  Negative for fever.  Musculoskeletal:  Positive for arthralgias.    Updated Vital Signs BP (!) 124/94 (BP Location: Left Arm)   Pulse 92   Temp 98.4 F (36.9 C) (Oral)   Resp 16   SpO2 97%   Physical Exam Vitals and nursing note reviewed.   Constitutional:      Appearance: Normal appearance.  HENT:     Head: Normocephalic and atraumatic.     Nose: Nose normal.     Mouth/Throat:     Mouth: Mucous membranes are moist.  Cardiovascular:     Rate and Rhythm: Normal rate.  Pulmonary:     Effort: Pulmonary effort is normal.  Abdominal:     General: Abdomen is flat.  Musculoskeletal:        General: Tenderness present.     Cervical back: Normal range of motion and neck supple.     Right knee: Bony tenderness present. No deformity, effusion, erythema or ecchymosis. Decreased range of motion. Tenderness present over the medial joint line.  Skin:    General: Skin is warm.  Neurological:     Mental Status: She is alert and oriented to person, place, and time.     (all labs ordered are listed, but only abnormal results are displayed) Labs Reviewed - No data to display  EKG: None  Radiology: DG Knee Complete 4 Views Right Result Date: 08/10/2024 CLINICAL DATA:  Right knee buckled 3 days ago with subsequent right knee pain. EXAM: RIGHT KNEE - COMPLETE 4+ VIEW COMPARISON:  August 11, 2020 FINDINGS: No evidence of an acute fracture or dislocation. There is mild tricompartmental joint space narrowing. Moderate severity medial marginal osteophyte formation is noted. There is a small  supra patellar effusion. IMPRESSION: 1. Mild to moderate severity degenerative changes. 2. Small suprapatellar effusion. Electronically Signed   By: Suzen Dials M.D.   On: 08/10/2024 17:44     Procedures   Medications Ordered in the ED  traMADol  (ULTRAM ) tablet 50 mg (50 mg Oral Given 08/10/24 1748)                                    Medical Decision Making Amount and/or Complexity of Data Reviewed Radiology: ordered.  Risk Prescription drug management.   Patient presented to the ED with a chief complaint of right knee pain for the past 4 days, exacerbated with any type of weightbearing and ambulation.  No injury, no fevers, no  weakness or numbness.  Does have a reassuring exam with limited range of motion due to pain especially with any knee flexion but does have good extension.  Sensation is intact throughout.  Palpable pain along the medial aspect of the knee but no instability noted.  Given tramadol  while in the ED to help with pain control.  Will obtain plain films for further evaluation, however will treat supportively as well with a knee brace.  X-ray with some suprapatellar effusion, degenerative changes that appear to be moderate to severe.  I discussed these results with her, she was provided with a copy of her x-ray on today's visit.  We discussed a short course of anti-inflammatories along with referral to sports medicine provider.  She is agreeable to plan and treatment will also place her on a brace.  She is stable for discharge.   Portions of this note were generated with Scientist, clinical (histocompatibility and immunogenetics). Dictation errors may occur despite best attempts at proofreading.   Final diagnoses:  Acute pain of right knee    ED Discharge Orders          Ordered    naproxen  (NAPROSYN ) 500 MG tablet  2 times daily        08/10/24 1801               Clifford Coudriet, PA-C 08/10/24 1803    Butler, Michael C, MD 08/11/24 0930

## 2024-10-31 ENCOUNTER — Encounter: Admitting: Family Medicine
